# Patient Record
Sex: Male | Born: 1970 | Race: Black or African American | Hispanic: No | Marital: Single | Smoking: Former smoker
Health system: Southern US, Community
[De-identification: ages and names within clinical notes are randomized; demographics above are authoritative.]

## PROBLEM LIST (undated history)

## (undated) DIAGNOSIS — R7303 Prediabetes: Secondary | ICD-10-CM

## (undated) DIAGNOSIS — I10 Essential (primary) hypertension: Secondary | ICD-10-CM

## (undated) DIAGNOSIS — IMO0002 Reserved for concepts with insufficient information to code with codable children: Secondary | ICD-10-CM

## (undated) HISTORY — DX: Essential (primary) hypertension: I10

## (undated) HISTORY — DX: Prediabetes: R73.03

## (undated) HISTORY — DX: Reserved for concepts with insufficient information to code with codable children: IMO0002

---

## 2005-03-02 ENCOUNTER — Emergency Department (HOSPITAL_COMMUNITY): Admission: EM | Admit: 2005-03-02 | Discharge: 2005-03-02 | Payer: Self-pay | Admitting: Emergency Medicine

## 2005-03-18 ENCOUNTER — Emergency Department (HOSPITAL_COMMUNITY): Admission: EM | Admit: 2005-03-18 | Discharge: 2005-03-19 | Payer: Self-pay | Admitting: Emergency Medicine

## 2005-03-28 ENCOUNTER — Emergency Department (HOSPITAL_COMMUNITY): Admission: EM | Admit: 2005-03-28 | Discharge: 2005-03-28 | Payer: Self-pay | Admitting: Family Medicine

## 2005-04-04 ENCOUNTER — Emergency Department (HOSPITAL_COMMUNITY): Admission: EM | Admit: 2005-04-04 | Discharge: 2005-04-05 | Payer: Self-pay | Admitting: Emergency Medicine

## 2005-04-22 ENCOUNTER — Ambulatory Visit (HOSPITAL_COMMUNITY): Admission: RE | Admit: 2005-04-22 | Discharge: 2005-04-22 | Payer: Self-pay | Admitting: Family Medicine

## 2005-04-22 ENCOUNTER — Encounter: Payer: Self-pay | Admitting: Vascular Surgery

## 2005-04-22 ENCOUNTER — Encounter: Payer: Self-pay | Admitting: Emergency Medicine

## 2005-05-03 ENCOUNTER — Encounter: Admission: RE | Admit: 2005-05-03 | Discharge: 2005-05-03 | Payer: Self-pay | Admitting: Gastroenterology

## 2005-05-14 ENCOUNTER — Emergency Department (HOSPITAL_COMMUNITY): Admission: EM | Admit: 2005-05-14 | Discharge: 2005-05-15 | Payer: Self-pay | Admitting: Emergency Medicine

## 2005-09-04 ENCOUNTER — Emergency Department (HOSPITAL_COMMUNITY): Admission: EM | Admit: 2005-09-04 | Discharge: 2005-09-04 | Payer: Self-pay | Admitting: Emergency Medicine

## 2005-10-14 ENCOUNTER — Emergency Department (HOSPITAL_COMMUNITY): Admission: EM | Admit: 2005-10-14 | Discharge: 2005-10-14 | Payer: Self-pay | Admitting: Emergency Medicine

## 2006-03-10 ENCOUNTER — Emergency Department (HOSPITAL_COMMUNITY): Admission: EM | Admit: 2006-03-10 | Discharge: 2006-03-11 | Payer: Self-pay | Admitting: Emergency Medicine

## 2006-10-08 IMAGING — CT CT ANGIO CHEST
1 of 2 series · 19 of 30 positions shown · IV contrast (APPLIED)
Comparison: None.

CLINICAL DATA: 35 year-old-male with chest pain and shortness of breath.
 CT ANGIOGRAPHY OF CHEST:
TECHNIQUE: Multidetector CT imaging of the chest was performed during bolus injection of intravenous contrast.  Multiplanar CT angiographic image reconstructions were generated to evaluate the vascular anatomy.
 Contrast:  80 cc Omnipaque 300.

[Series 7: pe 1.0 b20f st · axial · 0.70mm/px · z∈[+1142,+1396]mm · 19 of 284 slices shown]
[im 15/284  lung]
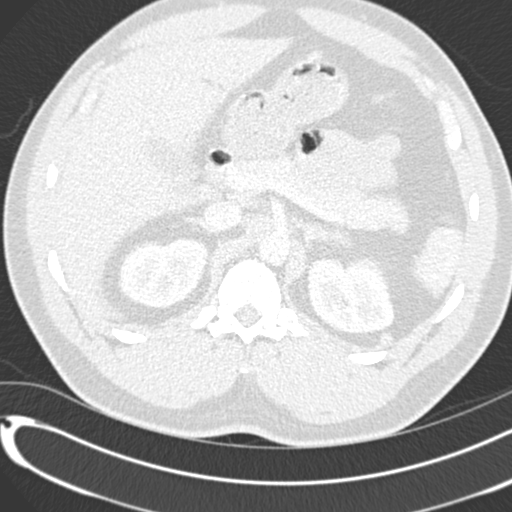
[im 29/284  mediastinal]
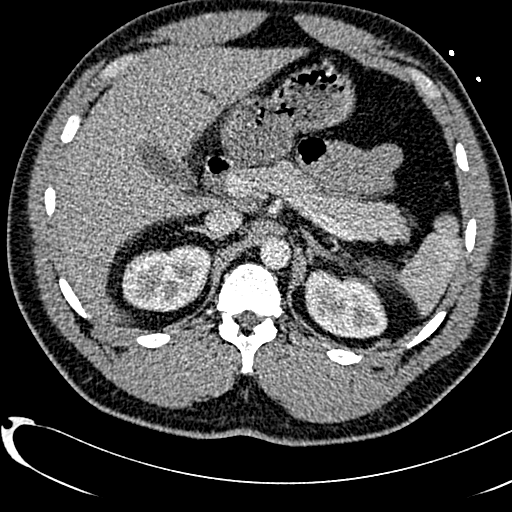
[im 43/284  lung]
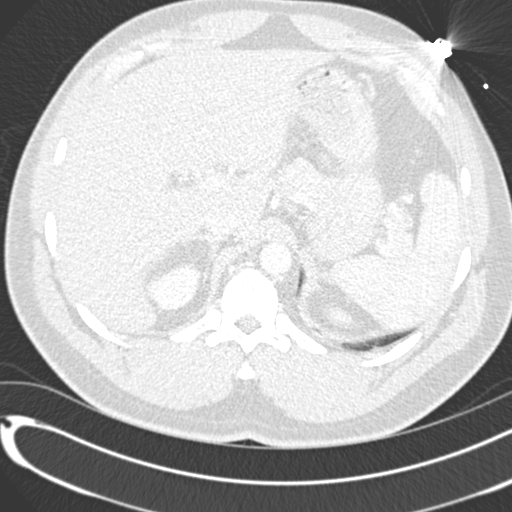
[im 57/284  mediastinal]
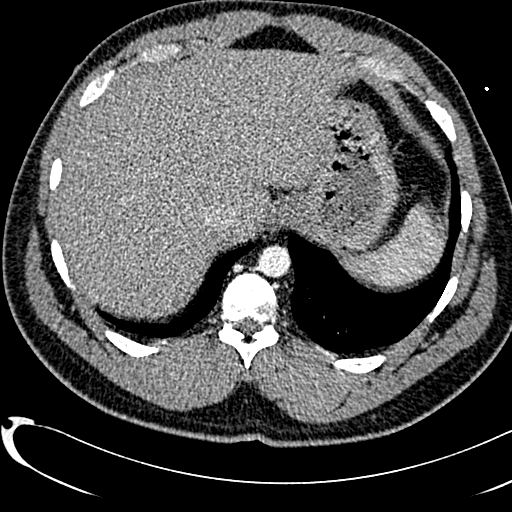
[im 71/284  lung]
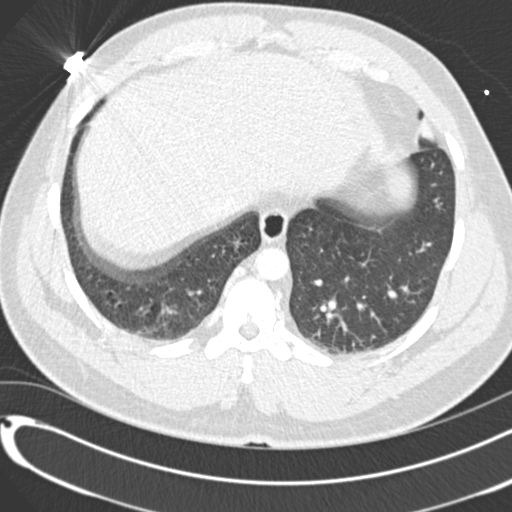
[im 85/284  mediastinal]
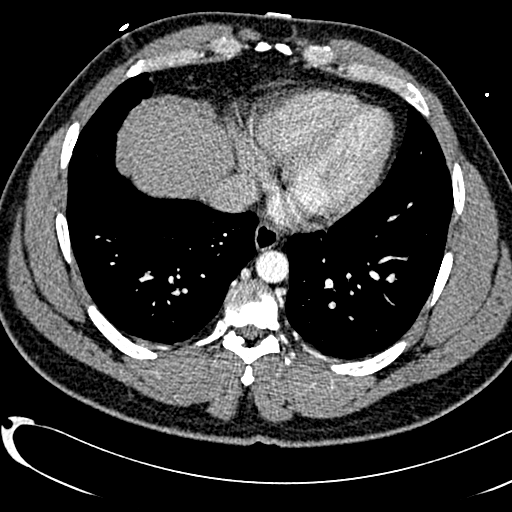
[im 100/284  lung]
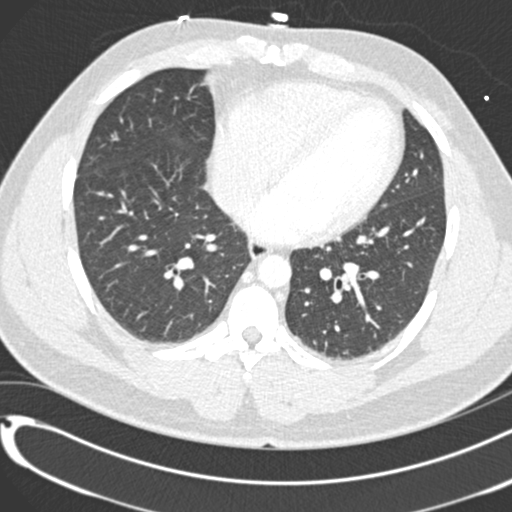
[im 114/284  mediastinal]
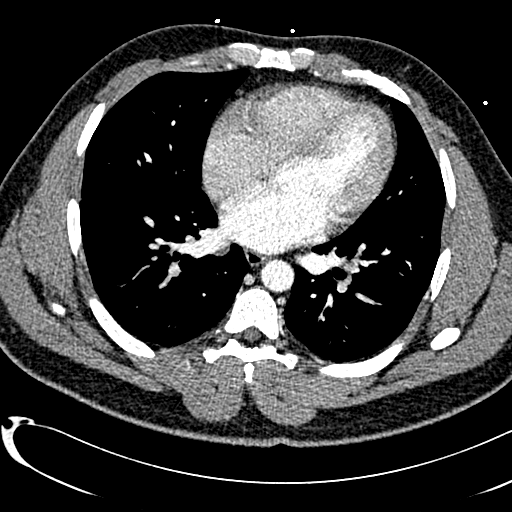
[im 128/284  lung]
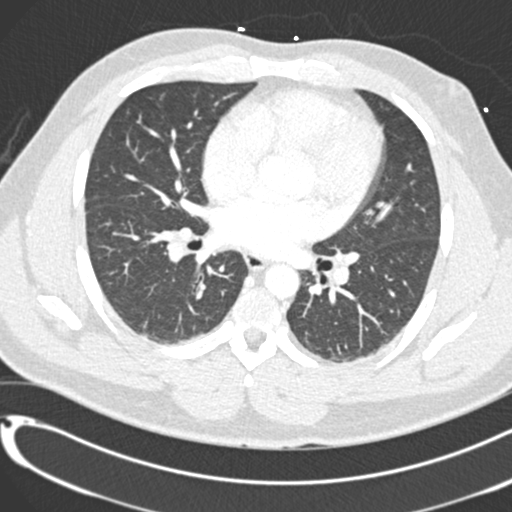
[im 142/284  mediastinal]
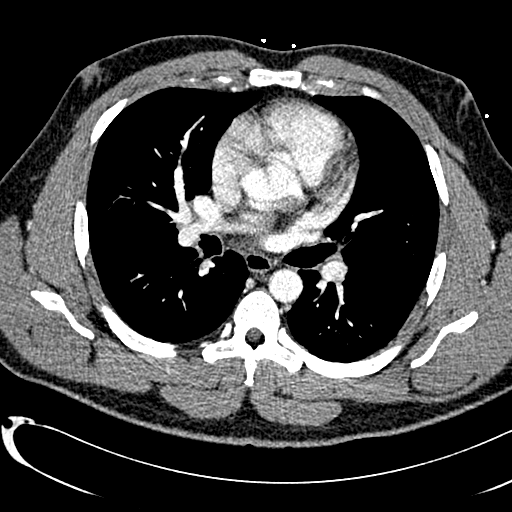
[im 156/284  lung]
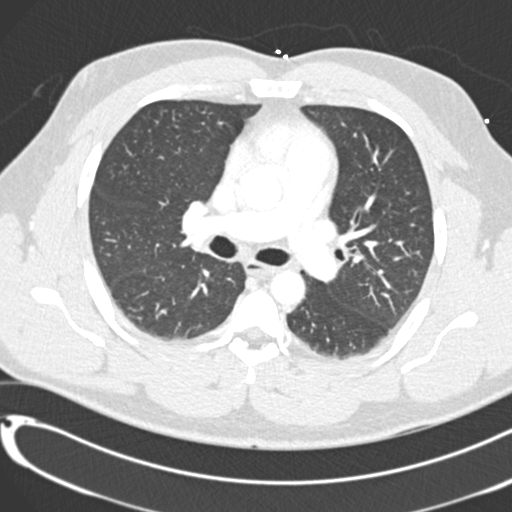
[im 170/284  mediastinal]
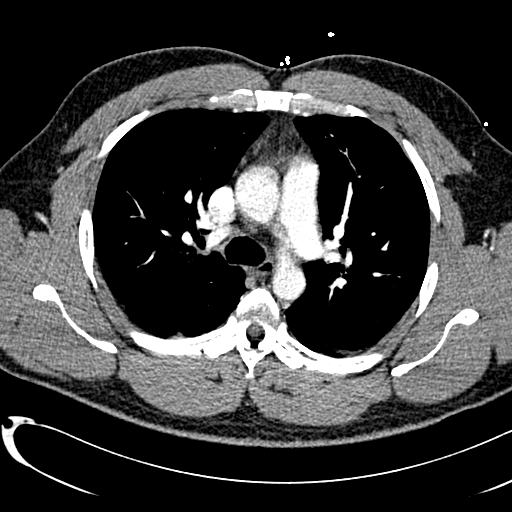
[im 184/284  lung]
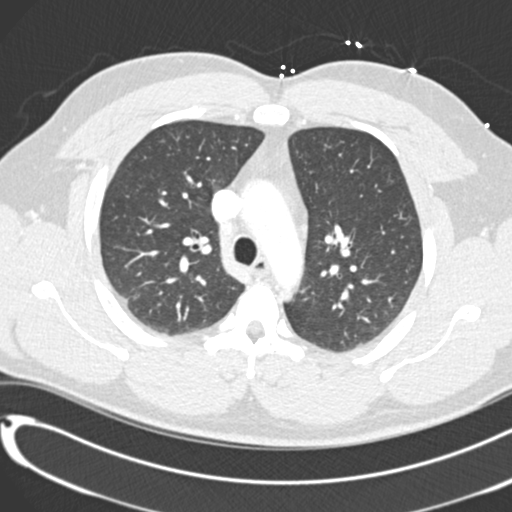
[im 199/284  mediastinal]
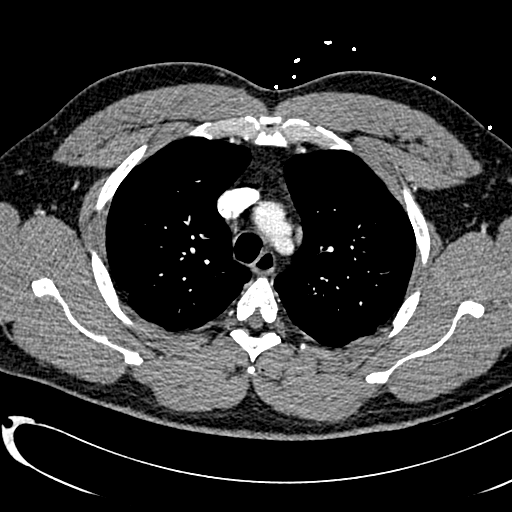
[im 213/284  lung]
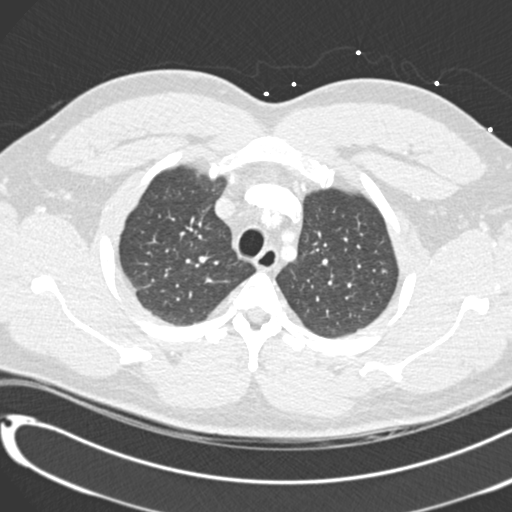
[im 227/284  mediastinal]
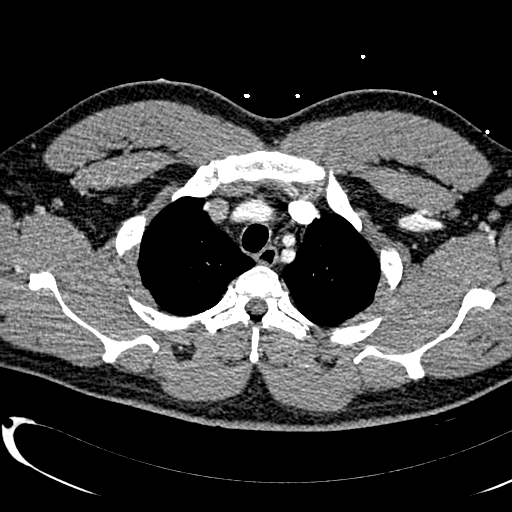
[im 241/284  lung]
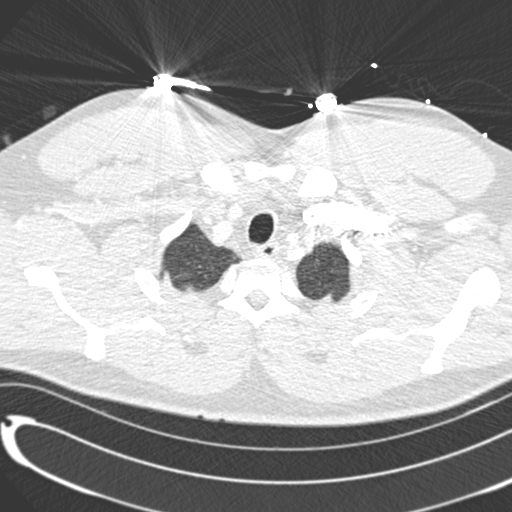
[im 255/284  mediastinal]
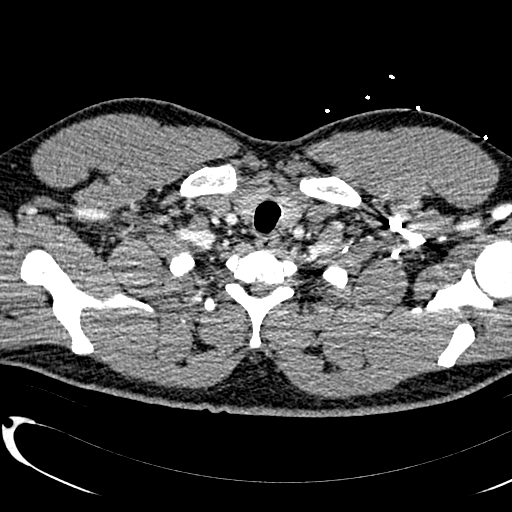
[im 269/284  lung]
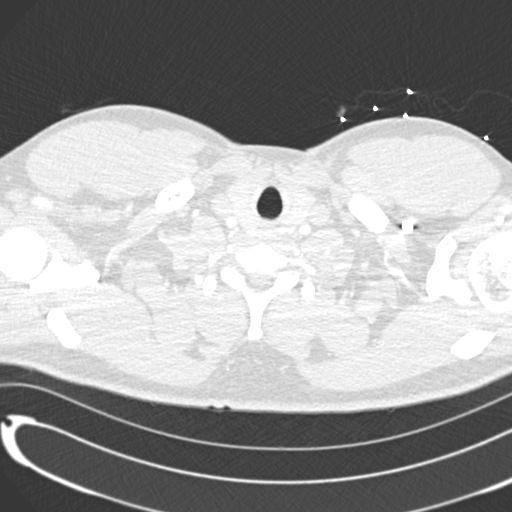

[19 of 30 positions shown; findings below may reference images not displayed]

FINDINGS: The chest wall, soft tissues and bony structures are unremarkable.  Benign appearing fatty right axillary lymph node.  The thyroid gland appears normal.  The heart size is normal.   No pericardial effusion. No mediastinal or hilar adenopathy.  The esophagus appears normal.  The aorta is normal in caliber. No dissection.  The pulmonary arterial tree is suboptimally opacified.  I don?t see any definite filling defects to suggest pulmonary emboli.  There is a rounded fluid attenuation structure which is located adjacent to the confluence of the inferior pulmonary veins on the right side.  This is likely a bronchogenic cyst or possibly a pericardial cyst or esophageal duplication cyst.  
 Examination of the lung parenchyma demonstrates no acute pulmonary findings.  No pulmonary masses or worrisome nodules.  The upper abdomen demonstrates no significant findings.
IMPRESSION: 1.  No CT evidence for pulmonary emboli. 
 2.  No acute pulmonary findings.  
 3.  Normal appearance of the thoracic aorta.

## 2007-04-01 IMAGING — CR DG CHEST 1V PORT
1 series · 1 of 1 positions shown · non-contrast
Comparison: none

CLINICAL DATA: Arm pain and syncope. 
PORTABLE CHEST ? 1 VIEW:
COMPARISION: 04/22/05

[view not recorded]
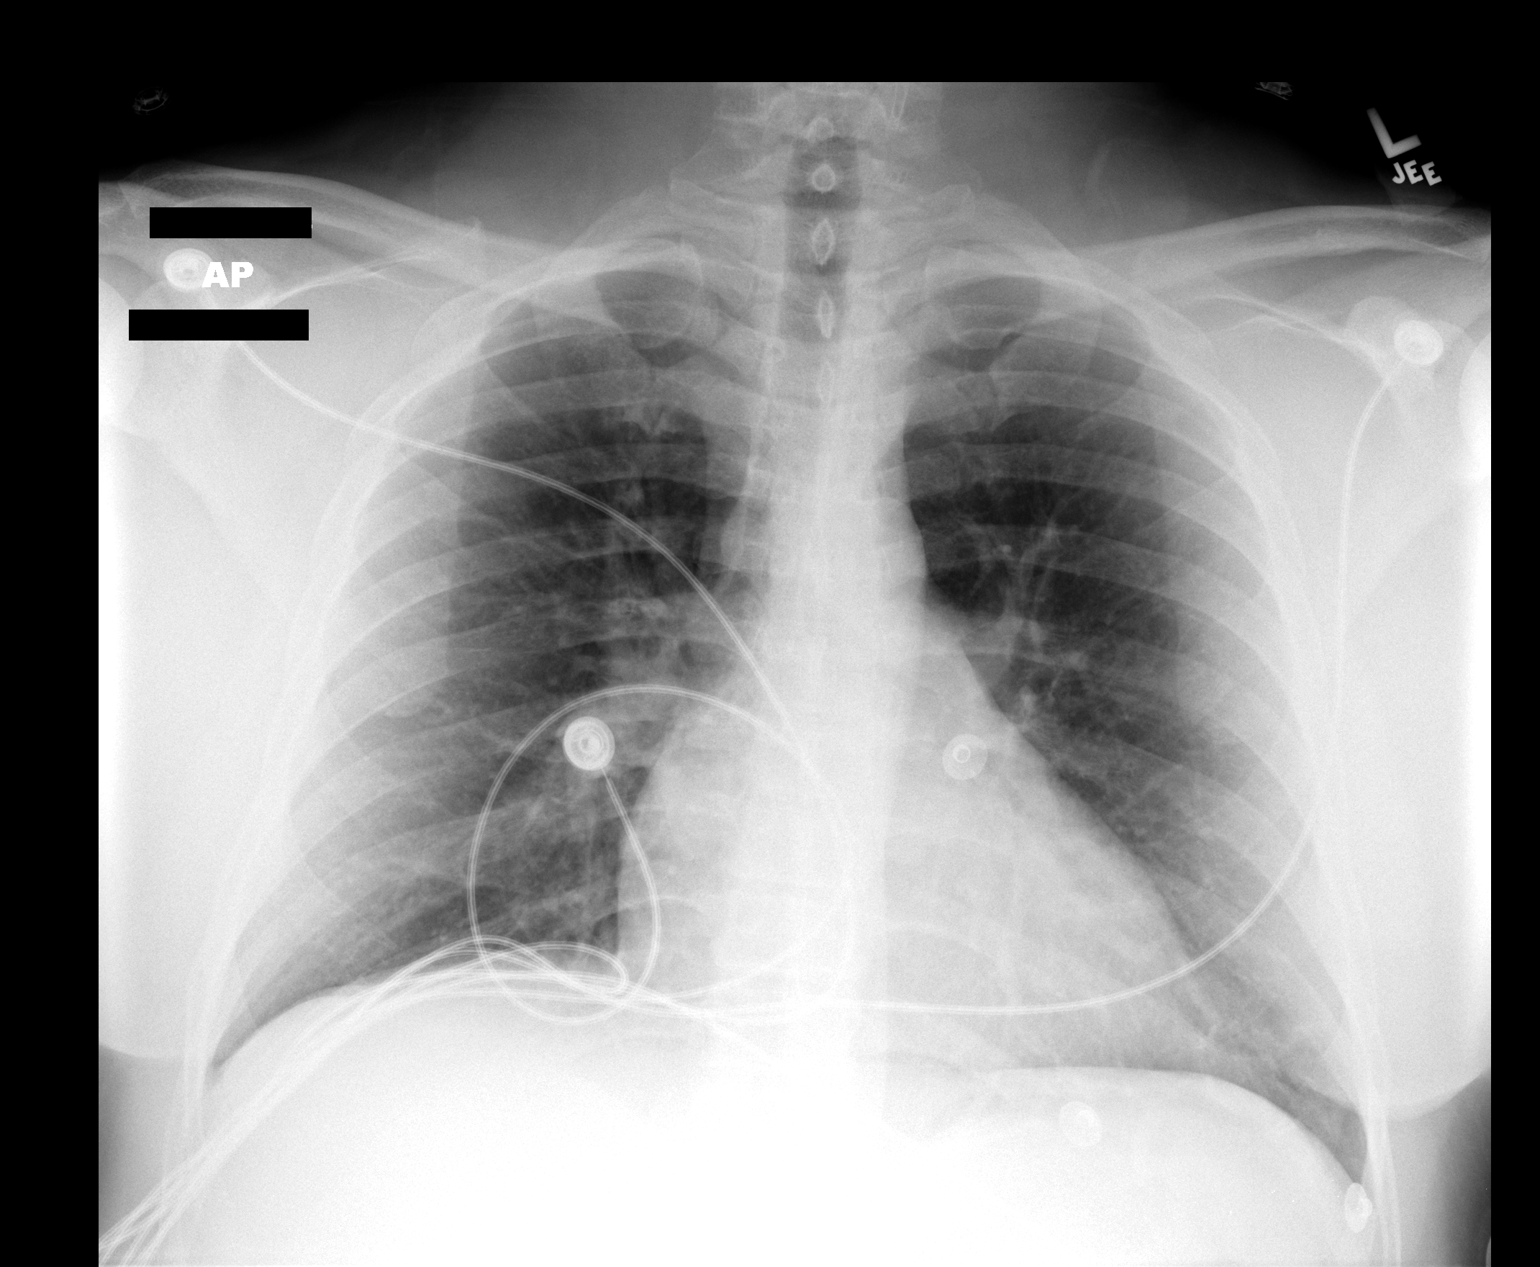

[1 of 1 positions shown; findings below may reference images not displayed]

FINDINGS: The heart size is normal.  There are no effusions or edema.  No airspace opacities are identified.
IMPRESSION: No active cardiopulmonary disease and no change from 04/22/05.

## 2007-06-01 ENCOUNTER — Emergency Department (HOSPITAL_COMMUNITY): Admission: EM | Admit: 2007-06-01 | Discharge: 2007-06-01 | Payer: Self-pay | Admitting: Emergency Medicine

## 2007-06-16 ENCOUNTER — Emergency Department (HOSPITAL_COMMUNITY): Admission: EM | Admit: 2007-06-16 | Discharge: 2007-06-16 | Payer: Self-pay | Admitting: Emergency Medicine

## 2007-06-16 ENCOUNTER — Ambulatory Visit: Payer: Self-pay | Admitting: *Deleted

## 2007-07-25 ENCOUNTER — Emergency Department (HOSPITAL_COMMUNITY): Admission: EM | Admit: 2007-07-25 | Discharge: 2007-07-26 | Payer: Self-pay | Admitting: Emergency Medicine

## 2007-08-19 ENCOUNTER — Ambulatory Visit: Payer: Self-pay | Admitting: Internal Medicine

## 2008-08-26 ENCOUNTER — Emergency Department (HOSPITAL_COMMUNITY): Admission: EM | Admit: 2008-08-26 | Discharge: 2008-08-26 | Payer: Self-pay | Admitting: Emergency Medicine

## 2008-08-27 ENCOUNTER — Ambulatory Visit: Payer: Self-pay | Admitting: *Deleted

## 2008-08-27 ENCOUNTER — Ambulatory Visit (HOSPITAL_COMMUNITY): Admission: RE | Admit: 2008-08-27 | Discharge: 2008-08-27 | Payer: Self-pay | Admitting: Emergency Medicine

## 2008-08-27 ENCOUNTER — Encounter (INDEPENDENT_AMBULATORY_CARE_PROVIDER_SITE_OTHER): Payer: Self-pay | Admitting: Emergency Medicine

## 2009-06-09 ENCOUNTER — Emergency Department (HOSPITAL_COMMUNITY): Admission: EM | Admit: 2009-06-09 | Discharge: 2009-06-09 | Payer: Self-pay | Admitting: Emergency Medicine

## 2010-09-20 ENCOUNTER — Inpatient Hospital Stay (INDEPENDENT_AMBULATORY_CARE_PROVIDER_SITE_OTHER)
Admission: RE | Admit: 2010-09-20 | Discharge: 2010-09-20 | Disposition: A | Discharge status: Home / Self Care | Payer: Self-pay | Source: Ambulatory Visit | Attending: Family Medicine | Admitting: Family Medicine

## 2010-09-20 DIAGNOSIS — N342 Other urethritis: Secondary | ICD-10-CM

## 2010-09-20 DIAGNOSIS — I872 Venous insufficiency (chronic) (peripheral): Secondary | ICD-10-CM

## 2010-09-20 LAB — POCT I-STAT, CHEM 8
Calcium, Ion: 1.22 mmol/L (ref 1.12–1.32)
Chloride: 100 mEq/L (ref 96–112)
Creatinine, Ser: 1 mg/dL (ref 0.50–1.35)
Potassium: 3.7 mEq/L (ref 3.5–5.1)
Sodium: 138 mEq/L (ref 135–145)

## 2010-09-20 LAB — POCT URINALYSIS DIP (DEVICE)
Glucose, UA: NEGATIVE mg/dL
Specific Gravity, Urine: 1.02 (ref 1.005–1.030)
pH: 6 (ref 5.0–8.0)

## 2010-09-21 LAB — GC/CHLAMYDIA PROBE AMP, GENITAL: Chlamydia, DNA Probe: NEGATIVE

## 2010-10-24 LAB — GC/CHLAMYDIA PROBE AMP, GENITAL: GC Probe Amp, Genital: NEGATIVE

## 2010-10-25 LAB — POCT URINALYSIS DIP (DEVICE)
Glucose, UA: NEGATIVE
Ketones, ur: NEGATIVE
Operator id: 30335
Specific Gravity, Urine: >=1.03
Urobilinogen, UA: 0.2

## 2010-10-25 LAB — URINALYSIS, ROUTINE W REFLEX MICROSCOPIC: Nitrite: NEGATIVE

## 2010-10-25 LAB — URINE MICROSCOPIC-ADD ON

## 2010-10-25 LAB — CBC
HCT: 45.3
Hemoglobin: 15.3
MCHC: 33.8
RBC: 5.3

## 2010-10-25 LAB — CULTURE, ROUTINE-GENITAL

## 2010-10-25 LAB — URINE CULTURE
Colony Count: NO GROWTH
Culture: NO GROWTH

## 2010-10-25 LAB — BASIC METABOLIC PANEL
CO2: 24
Chloride: 106
GFR calc non Af Amer: >60
Glucose, Bld: 92
Potassium: 3.3 — ABNORMAL LOW

## 2011-04-28 ENCOUNTER — Emergency Department (HOSPITAL_COMMUNITY)
Admission: EM | Admit: 2011-04-28 | Discharge: 2011-04-28 | Disposition: A | Discharge status: Home / Self Care | Payer: Self-pay | Source: Home / Self Care | Attending: Emergency Medicine | Admitting: Emergency Medicine

## 2011-04-28 ENCOUNTER — Encounter (HOSPITAL_COMMUNITY): Payer: Self-pay

## 2011-04-28 DIAGNOSIS — J45909 Unspecified asthma, uncomplicated: Secondary | ICD-10-CM

## 2011-04-28 MED ORDER — ALBUTEROL SULFATE HFA 108 (90 BASE) MCG/ACT IN AERS: 1.0000 | INHALATION_SPRAY | Freq: Four times a day (QID) | RESPIRATORY_TRACT | Status: DC | PRN

## 2011-04-28 MED ORDER — PREDNISONE 20 MG PO TABS: 40.0000 mg | ORAL_TABLET | Freq: Every day | ORAL | Status: AC

## 2011-04-28 MED ORDER — ALBUTEROL SULFATE (5 MG/ML) 0.5% IN NEBU
INHALATION_SOLUTION | RESPIRATORY_TRACT | Status: AC
  Filled 2011-04-28: qty 1

## 2011-04-28 MED ORDER — PREDNISONE 20 MG PO TABS
ORAL_TABLET | ORAL | Status: AC
  Filled 2011-04-28: qty 3

## 2011-04-28 MED ORDER — ALBUTEROL SULFATE (5 MG/ML) 0.5% IN NEBU
2.5000 mg | INHALATION_SOLUTION | Freq: Once | RESPIRATORY_TRACT | Status: AC
  Administered 2011-04-28: 2.5 mg via RESPIRATORY_TRACT

## 2011-04-28 MED ORDER — IPRATROPIUM BROMIDE 0.02 % IN SOLN
0.5000 mg | Freq: Once | RESPIRATORY_TRACT | Status: AC
  Administered 2011-04-28: 0.5 mg via RESPIRATORY_TRACT

## 2011-04-28 MED ORDER — AZITHROMYCIN 250 MG PO TABS: 250.0000 mg | ORAL_TABLET | Freq: Every day | ORAL | Status: AC

## 2011-04-28 MED ORDER — ALBUTEROL SULFATE (5 MG/ML) 0.5% IN NEBU
5.0000 mg | INHALATION_SOLUTION | Freq: Once | RESPIRATORY_TRACT | Status: AC
  Administered 2011-04-28: 5 mg via RESPIRATORY_TRACT

## 2011-04-28 MED ORDER — ALBUTEROL SULFATE (5 MG/ML) 0.5% IN NEBU
INHALATION_SOLUTION | RESPIRATORY_TRACT | Status: AC
  Filled 2011-04-28: qty 0.5

## 2011-04-28 NOTE — ED Provider Notes (Signed)
History     CSN: [id number]  Arrival date & time 04/28/11  1545   First MD Initiated Contact with Patient 04/28/11 1549      Chief Complaint  Patient presents with  . Shortness of Breath    (Consider location/radiation/quality/duration/timing/severity/associated sxs/prior treatment) HPI Comments: Patient presents to urgent care complaining of shortness of breath and wheezing since yesterday have been using albuterol at home without any improvement. Have been coughing as well for about one to 2 weeks with clear phlegm. Noticed it he does much worse at night and during the day. No fevers  Patient is a 41 y.o. male presenting with shortness of breath. The history is provided by the patient.  Shortness of Breath  The current episode started yesterday. The problem occurs frequently. The problem has been gradually worsening. The problem is moderate. Associated symptoms include rhinorrhea, sore throat, cough, shortness of breath and wheezing. Pertinent negatives include no chest pressure and no fever.    Past Medical History  Diagnosis Date  . Asthma     History reviewed. No pertinent past surgical history.  History reviewed. No pertinent family history.  History  Substance Use Topics  . Smoking status: Current Everyday Smoker -- 1.0 packs/day  . Smokeless tobacco: Not on file  . Alcohol Use: No      Review of Systems  Constitutional: Negative for fever, chills, activity change and fatigue.  HENT: Positive for sore throat and rhinorrhea.   Respiratory: Positive for cough, shortness of breath and wheezing.     Allergies  Review of patient's allergies indicates no known allergies.  Home Medications   Current Outpatient Rx  Name Route Sig Dispense Refill  . ALBUTEROL SULFATE HFA 108 (90 BASE) MCG/ACT IN AERS Inhalation Inhale 2 puffs into the lungs every 6 (six) hours as needed.    . ALBUTEROL SULFATE HFA 108 (90 BASE) MCG/ACT IN AERS Inhalation Inhale 1-2 puffs into the  lungs every 6 (six) hours as needed for wheezing. 1 Inhaler 0  . AZITHROMYCIN 250 MG PO TABS Oral Take 1 tablet (250 mg total) by mouth daily. Take first 2 tablets together, then 1 every day until finished. 6 tablet 0  . PREDNISONE 20 MG PO TABS Oral Take 2 tablets (40 mg total) by mouth daily. 2 tablets daily for 5 days 10 tablet 0    BP 189/93  Pulse 105  Temp(Src) 99.1 F (37.3 C) (Oral)  Resp 20  SpO2 96%  Physical Exam  Nursing note and vitals reviewed. Constitutional: He appears well-developed and well-nourished. No distress.  HENT:  Head: Normocephalic.  Mouth/Throat: No oropharyngeal exudate.  Eyes: Conjunctivae are normal.  Neck: No JVD present.  Pulmonary/Chest: Effort normal. No accessory muscle usage. No respiratory distress. He has decreased breath sounds. He has wheezes.  Abdominal: Soft.  Lymphadenopathy:    He has no cervical adenopathy.  Neurological: He is alert.  Skin: Skin is warm.    ED Course  Procedures (including critical care time)  Labs Reviewed - No data to display No results found.   1. Asthmatic bronchitis       MDM  Asthma exacerbation patient responded well to nebulizer treatments at urgent care patient admits to be a heavy smoker difficult to discontinue smoking. Encouraged patient to return if no improvement within the next 12 hours rest and good hydration along with schedule albuterol doses every 4-6 hours.     Jimmie Molly, MD 04/28/11 2001

## 2011-04-28 NOTE — ED Notes (Signed)
Pt has sob since yesterday, has used albuterol without improvement.

## 2011-04-28 NOTE — Discharge Instructions (Signed)
Asthma, Adult  Asthma is a disease of the lungs and can make it hard to breathe. Asthma cannot be cured, but medicine can help control it. Asthma may be started (triggered) by:   Pollen.   Dust.   Animal skin flakes (dander).   Molds.   Foods.   Respiratory infections (colds, flu).   Smoke.   Exercise.   Stress.   Other things that cause allergic reactions or allergies (allergens).  HOME CARE    Talk to your doctor about how to manage your attacks at home. This may include:   Using a tool called a peak flow meter.   Having medicine ready to stop the attack.   Take all medicine as told by your doctor.   Wash bed sheets and blankets every week in hot water and put them in the dryer.   Drink enough fluids to keep your pee (urine) clear or pale yellow.   Always be ready to get emergency help. Write down the phone number for your doctor. Keep it where you can easily find it.   Talk about exercise routines with your doctor.   If animal dander is causing your asthma, you may need to find a new home for your pet(s).  GET HELP RIGHT AWAY IF:    You have muscle aches.   You cough more.   You have chest pain.   You have thick spit (sputum) that changes to yellow, green, gray, or bloody.   Medicine does not stop your wheezing.   You have problems breathing.   You have a fever.   Your medicine causes:   A rash.   Itching.   Puffiness (swelling).   Breathing problems.  MAKE SURE YOU:    Understand these instructions.   Will watch your condition.   Will get help right away if you are not doing well or get worse.  Document Released: 07/03/2007 Document Revised: 01/03/2011 Document Reviewed: 11/25/2007  [employer] Patient Information 2012 [employer].

## 2012-10-24 ENCOUNTER — Emergency Department (INDEPENDENT_AMBULATORY_CARE_PROVIDER_SITE_OTHER)
Admission: EM | Admit: 2012-10-24 | Discharge: 2012-10-24 | Disposition: A | Discharge status: Other Acute Inpatient Hospital | Payer: Self-pay | Source: Home / Self Care | Attending: Emergency Medicine | Admitting: Emergency Medicine

## 2012-10-24 ENCOUNTER — Emergency Department (HOSPITAL_COMMUNITY): Payer: No Typology Code available for payment source

## 2012-10-24 ENCOUNTER — Emergency Department (INDEPENDENT_AMBULATORY_CARE_PROVIDER_SITE_OTHER): Payer: Self-pay

## 2012-10-24 ENCOUNTER — Inpatient Hospital Stay (HOSPITAL_COMMUNITY)
Admission: EM | Admit: 2012-10-24 | Discharge: 2012-10-27 | DRG: 191 | Disposition: A | Discharge status: Home / Self Care | Payer: No Typology Code available for payment source | Attending: Internal Medicine | Admitting: Internal Medicine

## 2012-10-24 ENCOUNTER — Encounter (HOSPITAL_COMMUNITY): Payer: Self-pay

## 2012-10-24 ENCOUNTER — Encounter (HOSPITAL_COMMUNITY): Payer: Self-pay | Admitting: *Deleted

## 2012-10-24 DIAGNOSIS — Z6841 Body Mass Index (BMI) 40.0 and over, adult: Secondary | ICD-10-CM

## 2012-10-24 DIAGNOSIS — T380X5A Adverse effect of glucocorticoids and synthetic analogues, initial encounter: Secondary | ICD-10-CM | POA: Diagnosis not present

## 2012-10-24 DIAGNOSIS — J069 Acute upper respiratory infection, unspecified: Secondary | ICD-10-CM

## 2012-10-24 DIAGNOSIS — R062 Wheezing: Secondary | ICD-10-CM

## 2012-10-24 DIAGNOSIS — Z23 Encounter for immunization: Secondary | ICD-10-CM

## 2012-10-24 DIAGNOSIS — J4489 Other specified chronic obstructive pulmonary disease: Secondary | ICD-10-CM

## 2012-10-24 DIAGNOSIS — R0902 Hypoxemia: Secondary | ICD-10-CM | POA: Diagnosis present

## 2012-10-24 DIAGNOSIS — M7989 Other specified soft tissue disorders: Secondary | ICD-10-CM

## 2012-10-24 DIAGNOSIS — J441 Chronic obstructive pulmonary disease with (acute) exacerbation: Principal | ICD-10-CM | POA: Diagnosis present

## 2012-10-24 DIAGNOSIS — D72829 Elevated white blood cell count, unspecified: Secondary | ICD-10-CM | POA: Diagnosis present

## 2012-10-24 DIAGNOSIS — J449 Chronic obstructive pulmonary disease, unspecified: Secondary | ICD-10-CM | POA: Diagnosis present

## 2012-10-24 DIAGNOSIS — R0602 Shortness of breath: Secondary | ICD-10-CM

## 2012-10-24 DIAGNOSIS — Z7982 Long term (current) use of aspirin: Secondary | ICD-10-CM

## 2012-10-24 DIAGNOSIS — F17200 Nicotine dependence, unspecified, uncomplicated: Secondary | ICD-10-CM | POA: Diagnosis present

## 2012-10-24 DIAGNOSIS — R609 Edema, unspecified: Secondary | ICD-10-CM | POA: Diagnosis present

## 2012-10-24 DIAGNOSIS — R600 Localized edema: Secondary | ICD-10-CM | POA: Diagnosis present

## 2012-10-24 LAB — CBC WITH DIFFERENTIAL/PLATELET
Basophils Absolute: 0 10*3/uL (ref 0.0–0.1)
Basophils Relative: 0 % (ref 0–1)
Eosinophils Absolute: 0.5 10*3/uL (ref 0.0–0.7)
HCT: 44.2 % (ref 39.0–52.0)
Hemoglobin: 14.9 g/dL (ref 13.0–17.0)
Lymphocytes Relative: 20 % (ref 12–46)
MCHC: 33.7 g/dL (ref 30.0–36.0)
Monocytes Relative: 10 % (ref 3–12)
Neutro Abs: 7.5 10*3/uL (ref 1.7–7.7)
Neutrophils Relative %: 66 % (ref 43–77)
RDW: 14.3 % (ref 11.5–15.5)
WBC: 11.5 10*3/uL — ABNORMAL HIGH (ref 4.0–10.5)

## 2012-10-24 LAB — BASIC METABOLIC PANEL
CO2: 26 mEq/L (ref 19–32)
Chloride: 99 mEq/L (ref 96–112)
Creatinine, Ser: 0.97 mg/dL (ref 0.50–1.35)
GFR calc Af Amer: >90 mL/min (ref >90)
Potassium: 3.8 mEq/L (ref 3.5–5.1)

## 2012-10-24 LAB — D-DIMER, QUANTITATIVE: D-Dimer, Quant: <0.27 ug/mL-FEU (ref 0.00–0.48)

## 2012-10-24 MED ORDER — ALBUTEROL SULFATE (5 MG/ML) 0.5% IN NEBU
2.5000 mg | INHALATION_SOLUTION | RESPIRATORY_TRACT | Status: DC
  Administered 2012-10-24 – 2012-10-27 (×15): 2.5 mg via RESPIRATORY_TRACT
  Filled 2012-10-24 (×16): qty 0.5

## 2012-10-24 MED ORDER — PANTOPRAZOLE SODIUM 40 MG PO TBEC
40.0000 mg | DELAYED_RELEASE_TABLET | Freq: Every day | ORAL | Status: DC
  Administered 2012-10-25 – 2012-10-27 (×4): 40 mg via ORAL
  Filled 2012-10-24 (×3): qty 1

## 2012-10-24 MED ORDER — SODIUM CHLORIDE 0.9 % IV BOLUS (SEPSIS)
500.0000 mL | Freq: Once | INTRAVENOUS | Status: AC
  Administered 2012-10-24: 500 mL via INTRAVENOUS

## 2012-10-24 MED ORDER — ALBUTEROL SULFATE (5 MG/ML) 0.5% IN NEBU
INHALATION_SOLUTION | RESPIRATORY_TRACT | Status: AC
  Filled 2012-10-24: qty 1

## 2012-10-24 MED ORDER — ONDANSETRON HCL 4 MG PO TABS: 4.0000 mg | ORAL_TABLET | Freq: Four times a day (QID) | ORAL | Status: DC | PRN

## 2012-10-24 MED ORDER — IOHEXOL 350 MG/ML SOLN
80.0000 mL | Freq: Once | INTRAVENOUS | Status: AC | PRN
  Administered 2012-10-24: 80 mL via INTRAVENOUS

## 2012-10-24 MED ORDER — METHYLPREDNISOLONE SODIUM SUCC 125 MG IJ SOLR
80.0000 mg | Freq: Two times a day (BID) | INTRAMUSCULAR | Status: AC
  Administered 2012-10-25: 05:00:00 via INTRAVENOUS
  Administered 2012-10-25: 80 mg via INTRAVENOUS
  Filled 2012-10-24 (×2): qty 1.28

## 2012-10-24 MED ORDER — IOHEXOL 350 MG/ML SOLN
100.0000 mL | Freq: Once | INTRAVENOUS | Status: AC | PRN
  Administered 2012-10-24: 100 mL via INTRAVENOUS

## 2012-10-24 MED ORDER — ALBUTEROL SULFATE (5 MG/ML) 0.5% IN NEBU
2.5000 mg | INHALATION_SOLUTION | Freq: Once | RESPIRATORY_TRACT | Status: AC
  Administered 2012-10-24: 2.5 mg via RESPIRATORY_TRACT
  Filled 2012-10-24: qty 0.5

## 2012-10-24 MED ORDER — IPRATROPIUM BROMIDE 0.02 % IN SOLN
0.5000 mg | RESPIRATORY_TRACT | Status: DC
  Administered 2012-10-24 – 2012-10-27 (×14): 0.5 mg via RESPIRATORY_TRACT
  Filled 2012-10-24 (×14): qty 2.5

## 2012-10-24 MED ORDER — ZOLPIDEM TARTRATE 5 MG PO TABS: 5.0000 mg | ORAL_TABLET | Freq: Every evening | ORAL | Status: DC | PRN

## 2012-10-24 MED ORDER — OXYCODONE HCL 5 MG PO TABS: 5.0000 mg | ORAL_TABLET | ORAL | Status: DC | PRN

## 2012-10-24 MED ORDER — METHYLPREDNISOLONE SODIUM SUCC 125 MG IJ SOLR
125.0000 mg | Freq: Once | INTRAMUSCULAR | Status: AC
  Administered 2012-10-24: 125 mg via INTRAMUSCULAR

## 2012-10-24 MED ORDER — ALUM & MAG HYDROXIDE-SIMETH 200-200-20 MG/5ML PO SUSP: 30.0000 mL | Freq: Four times a day (QID) | ORAL | Status: DC | PRN

## 2012-10-24 MED ORDER — ACETAMINOPHEN 325 MG PO TABS: 650.0000 mg | ORAL_TABLET | Freq: Four times a day (QID) | ORAL | Status: DC | PRN

## 2012-10-24 MED ORDER — ENOXAPARIN SODIUM 80 MG/0.8ML ~~LOC~~ SOLN
80.0000 mg | SUBCUTANEOUS | Status: DC
  Administered 2012-10-25: 12:00:00 80 mg via SUBCUTANEOUS
  Filled 2012-10-24 (×2): qty 0.8

## 2012-10-24 MED ORDER — ENOXAPARIN SODIUM 40 MG/0.4ML ~~LOC~~ SOLN: 40.0000 mg | SUBCUTANEOUS | Status: DC

## 2012-10-24 MED ORDER — ENOXAPARIN SODIUM 150 MG/ML ~~LOC~~ SOLN: 1.0000 mg/kg | Freq: Two times a day (BID) | SUBCUTANEOUS | Status: DC

## 2012-10-24 MED ORDER — ALBUTEROL SULFATE (5 MG/ML) 0.5% IN NEBU
5.0000 mg | INHALATION_SOLUTION | Freq: Once | RESPIRATORY_TRACT | Status: AC
  Administered 2012-10-24: 5 mg via RESPIRATORY_TRACT

## 2012-10-24 MED ORDER — IPRATROPIUM-ALBUTEROL 0.5-2.5 (3) MG/3ML IN SOLN
3.0000 mL | Freq: Once | RESPIRATORY_TRACT | Status: AC
  Administered 2012-10-24: 3 mL via RESPIRATORY_TRACT

## 2012-10-24 MED ORDER — HYDROMORPHONE HCL PF 1 MG/ML IJ SOLN: 0.5000 mg | INTRAMUSCULAR | Status: DC | PRN

## 2012-10-24 MED ORDER — ONDANSETRON HCL 4 MG/2ML IJ SOLN: 4.0000 mg | Freq: Four times a day (QID) | INTRAMUSCULAR | Status: DC | PRN

## 2012-10-24 MED ORDER — SODIUM CHLORIDE 0.9 % IV SOLN
INTRAVENOUS | Status: DC
  Administered 2012-10-25: 04:00:00 100 mL via INTRAVENOUS

## 2012-10-24 MED ORDER — LEVOFLOXACIN IN D5W 500 MG/100ML IV SOLN
500.0000 mg | INTRAVENOUS | Status: DC
  Administered 2012-10-25 – 2012-10-26 (×2): 500 mg via INTRAVENOUS
  Filled 2012-10-24 (×3): qty 100

## 2012-10-24 MED ORDER — ACETAMINOPHEN 650 MG RE SUPP: 650.0000 mg | Freq: Four times a day (QID) | RECTAL | Status: DC | PRN

## 2012-10-24 MED ORDER — LEVOFLOXACIN IN D5W 750 MG/150ML IV SOLN
750.0000 mg | Freq: Once | INTRAVENOUS | Status: AC
  Administered 2012-10-25: 750 mg via INTRAVENOUS
  Filled 2012-10-24: qty 150

## 2012-10-24 MED ORDER — METHYLPREDNISOLONE SODIUM SUCC 125 MG IJ SOLR
INTRAMUSCULAR | Status: AC
  Filled 2012-10-24: qty 2

## 2012-10-24 NOTE — ED Notes (Signed)
Patient transported to CT 

## 2012-10-24 NOTE — ED Notes (Signed)
MD at bedside. 

## 2012-10-24 NOTE — ED Provider Notes (Signed)
CT scan was reviewed and is negative for PE made arrangements to admit the patient to the hospital for COPD flare or cast for an additional albuterol treatment, as he is satting 90% on 3 L of oxygen.  I discussed this plan with the patient is agreeable to admission  Arman Filter, NP 10/24/12 2206

## 2012-10-24 NOTE — ED Notes (Signed)
Reportedly has been using other peoples MID for some time now. Audible wheezing

## 2012-10-24 NOTE — ED Provider Notes (Signed)
CSN: [id number]     Arrival date & time 10/24/12  1739 History   First MD Initiated Contact with Patient 10/24/12 1859     Chief Complaint  Patient presents with  . Shortness of Breath  . Leg Swelling   (Consider location/radiation/quality/duration/timing/severity/associated sxs/prior Treatment) Patient is a 42 y.o. male presenting with shortness of breath.  Shortness of Breath Associated symptoms: chest pain   Associated symptoms: no abdominal pain, no headaches, no rash and no vomiting    patient's had cough and shortness of breath over the last week. Has a history of asthma. She's taken a friend's steroids for 4 days without relief. He's also been using inhalers. He states normally it would respond to inhaler. He was seen in urgent care and transferred down here for CT angiography. He has swelling in both lower extremities, however it is greater on the right side. He states that leg is always swollen. He is a cough is minimally productive. He's had some dull chest pain. He has asthma and continues to smoke.  Past Medical History  Diagnosis Date  . Asthma    History reviewed. No pertinent past surgical history. History reviewed. No pertinent family history. History  Substance Use Topics  . Smoking status: Current Every Day Smoker -- 1.00 packs/day  . Smokeless tobacco: Not on file  . Alcohol Use: No    Review of Systems  Constitutional: Positive for fatigue. Negative for activity change and appetite change.  HENT: Negative for neck stiffness.   Eyes: Negative for pain.  Respiratory: Positive for shortness of breath. Negative for chest tightness.   Cardiovascular: Positive for chest pain and leg swelling.  Gastrointestinal: Negative for nausea, vomiting, abdominal pain and diarrhea.  Genitourinary: Negative for flank pain.  Musculoskeletal: Negative for back pain.  Skin: Negative for rash.  Neurological: Negative for weakness, numbness and headaches.  Psychiatric/Behavioral:  Negative for behavioral problems.    Allergies  Review of patient's allergies indicates no known allergies.  Home Medications   Current Outpatient Rx  Name  Route  Sig  Dispense  Refill  . albuterol (PROVENTIL HFA;VENTOLIN HFA) 108 (90 BASE) MCG/ACT inhaler   Inhalation   Inhale 2 puffs into the lungs every 6 (six) hours as needed for wheezing or shortness of breath.          Marland Kitchen aspirin EC 325 MG tablet   Oral   Take 325 mg by mouth daily as needed for pain.         . hydrocortisone cream 1 %   Topical   Apply 1 application topically daily as needed (rash).         . neomycin-bacitracin-polymyxin (NEOSPORIN) OINT   Topical   Apply 1 application topically 2 (two) times daily as needed (wound care).          BP 140/75  Pulse 112  Temp(Src) 98.1 F (36.7 C) (Oral)  Resp 22  SpO2 92% Physical Exam  Nursing note and vitals reviewed. Constitutional: He is oriented to person, place, and time. He appears well-developed and well-nourished.  HENT:  Head: Normocephalic and atraumatic.  Eyes: EOM are normal. Pupils are equal, round, and reactive to light.  Neck: Normal range of motion. Neck supple.  Cardiovascular: Regular rhythm and normal heart sounds.   No murmur heard. Tachycardia  Pulmonary/Chest: Effort normal. He has wheezes.  Diffuse wheezes and prolonged expirations. Mild tachypnea no distress  Abdominal: Soft. Bowel sounds are normal. He exhibits no distension and no mass. There is  no tenderness. There is no rebound and no guarding.  Musculoskeletal: Normal range of motion. He exhibits edema.  Edema bilateral lower extremities. Worse on right. Some changes of chronic vascular insufficiency.  Neurological: He is alert and oriented to person, place, and time. No cranial nerve deficit.  Skin: Skin is warm and dry.  Psychiatric: He has a normal mood and affect.    ED Course  Procedures (including critical care time) Labs Review Labs Reviewed - No data to  display Imaging Review Dg Chest 2 View  10/24/2012   CLINICAL DATA:  Cough, shortness of breath and wheezing for 2 days, long smoking history, history asthma  EXAM: CHEST  2 VIEW  COMPARISON:  10/14/2005  FINDINGS: Minimal enlargement of cardiac silhouette.  Mediastinal contours and pulmonary vascularity normal.  Bronchitic changes with slight flattening of diaphragms question chronic bronchitis/COPD.  No definite acute infiltrate, pleural effusion or pneumothorax.  No acute osseous findings.  IMPRESSION: Minimal enlargement of cardiac silhouette.  Bronchitic changes and mild emphysematous changes question related to chronic bronchitis or COPD.  No acute infiltrate.   Electronically Signed   By: Ulyses Southward M.D.   On: 10/24/2012 15:47    MDM  No diagnosis found. Patient with shortness of breath. Likely asthma/COPD but does have swelling of right leg greater than left. Will get CT angiography. Likely require admission to hospital. Does not have PCP    Juliet Rude. Rubin Payor, MD 10/24/12 2023

## 2012-10-24 NOTE — ED Notes (Signed)
Pt having sob getting progressively worse x 2 days. Went to [facility]cc today and received 3 breathing tx and solumedrol IM with minimal relief. Audible wheezing noted on arrival, spo2 94%, pt is able to speak in full sentences. Pt reports having swelling to right leg for extended amount of time, ucc sent here for ct scan of chest to r/o PE.

## 2012-10-24 NOTE — H&P (Signed)
[facility] History and Physical  Samuel [name]:[id number] DOB: 29-Apr-1970 DOA: 10/24/2012  Referring physician:  EDP PCP: No PCP Per Patient  Specialists:   Chief Complaint: SOB  HPI: Samuel [name] is a 42 y.o. male with a history of Asthma/COPD who presents to the ED with complaints of Worsening SOB, and Wheezing over the past 2 days.  He reports having increased cough which is productive of clear sputum.   He denies having any fevers or chills.   He went to the [facility] today to be evaluated and they sent him to the ED for further evaluation due to Edema of his RLE which is chronic and his SOB and concerns fro a possible PE.   He has  A CTA of the Chest performed in the ED which was negative for a PE.   He was treated with nebulizer treatements and IV steroid RX, and supplemental O2, and was referred for medical admission.      Review of Systems: The patient denies anorexia, fever, chills, headaches, weight loss,, vision loss, diplopia, dizziness, decreased hearing, rhinitis, hoarseness, chest pain, syncope, dyspnea on exertion, peripheral edema, balance deficits, cough, hemoptysis, abdominal pain, nausea, vomiting, diarrhea, constipation, hematemesis, melena, hematochezia, severe indigestion/heartburn, dysuria, hematuria, incontinence, muscle weakness, suspicious skin lesions, transient blindness, difficulty walking, depression, unusual weight change, abnormal bleeding, enlarged lymph nodes, angioedema, and breast masses.    Past Medical History  Diagnosis Date  . Asthma     History reviewed. No pertinent past surgical history.  Prior to Admission medications   Medication Sig Start Date End Date Taking? Authorizing Provider  albuterol (PROVENTIL HFA;VENTOLIN HFA) 108 (90 BASE) MCG/ACT inhaler Inhale 2 puffs into the lungs every 6 (six) hours as needed for wheezing or shortness of breath.    Yes Historical Provider, MD  aspirin EC 325 MG tablet Take 325 mg by mouth daily as  needed for pain.   Yes Historical Provider, MD  hydrocortisone cream 1 % Apply 1 application topically daily as needed (rash).   Yes Historical Provider, MD  neomycin-bacitracin-polymyxin (NEOSPORIN) OINT Apply 1 application topically 2 (two) times daily as needed (wound care).   Yes Historical Provider, MD    No Known Allergies  Social History:  reports that he has been smoking.  He does not have any smokeless tobacco history on file. He reports that he does not drink alcohol. His drug history is not on file.     History reviewed. No pertinent family history.  (be sure to complete)   Physical Exam:  GEN:  Pleasant  Morbidly Obese  42 y.o. African American male  examined  and in no acute distress; cooperative with exam Filed Vitals:   10/24/12 2119 10/24/12 2152 10/24/12 2200 10/24/12 2215  BP:   140/47   Pulse:   105   Temp: 98.7 F (37.1 C)     TempSrc: Oral     Resp:   13   SpO2:  90% 92% 96%   Blood pressure 140/47, pulse 105, temperature 98.7 F (37.1 C), temperature source Oral, resp. rate 13, SpO2 96.00%. PSYCH: He is alert and oriented x4; does not appear anxious does not appear depressed; affect is normal HEENT: Normocephalic and Atraumatic, Mucous membranes pink; PERRLA; EOM intact; Fundi:  Benign;  No scleral icterus, Nares: Patent, Oropharynx: Clear, Fair Dentition, Neck:  FROM, no cervical lymphadenopathy nor thyromegaly or carotid bruit; no JVD; Breasts:: Not examined CHEST WALL: No tenderness CHEST: Normal respiration, clear to auscultation bilaterally HEART:  Regular rate and rhythm; no murmurs rubs or gallops BACK: No kyphosis or scoliosis; no CVA tenderness ABDOMEN: Positive Bowel Sounds, Obese, soft non-tender; no masses, no organomegaly, no pannus; no intertriginous candida. Rectal Exam: Not done EXTREMITIES: No cyanosis, clubbing or 2+EDEMA RLE with chronic Venous stasis changes, no ulcerations. Genitalia: not examined PULSES: 2+ and symmetric SKIN: Normal  hydration no rash or ulceration CNS: Cranial nerves 2-12 grossly intact no focal neurologic deficit    Labs on Admission:  Basic Metabolic Panel:  Recent Labs Lab 10/24/12 1508  NA 136  K 3.8  CL 99  CO2 26  GLUCOSE 101*  BUN 8  CREATININE 0.97  CALCIUM 9.0   Liver Function Tests: No results found for this basename: AST, ALT, ALKPHOS, BILITOT, PROT, ALBUMIN,  in the last 168 hours No results found for this basename: LIPASE, AMYLASE,  in the last 168 hours No results found for this basename: AMMONIA,  in the last 168 hours CBC:  Recent Labs Lab 10/24/12 1508  WBC 11.5*  NEUTROABS 7.5  HGB 14.9  HCT 44.2  MCV 83.7  PLT 311   Cardiac Enzymes: No results found for this basename: CKTOTAL, CKMB, CKMBINDEX, TROPONINI,  in the last 168 hours  BNP (last 3 results) No results found for this basename: PROBNP,  in the last 8760 hours CBG: No results found for this basename: GLUCAP,  in the last 168 hours  Radiological Exams on Admission: Dg Chest 2 View  10/24/2012   CLINICAL DATA:  Cough, shortness of breath and wheezing for 2 days, long smoking history, history asthma  EXAM: CHEST  2 VIEW  COMPARISON:  10/14/2005  FINDINGS: Minimal enlargement of cardiac silhouette.  Mediastinal contours and pulmonary vascularity normal.  Bronchitic changes with slight flattening of diaphragms question chronic bronchitis/COPD.  No definite acute infiltrate, pleural effusion or pneumothorax.  No acute osseous findings.  IMPRESSION: Minimal enlargement of cardiac silhouette.  Bronchitic changes and mild emphysematous changes question related to chronic bronchitis or COPD.  No acute infiltrate.   Electronically Signed   By: Ulyses Southward M.D.   On: 10/24/2012 15:47   Ct Angio Chest W/cm &/or Wo Cm  10/24/2012   *RADIOLOGY REPORT*  Clinical Data: 42 year old male with shortness of breath and chest discomfort.  Wheezing and cough.  CT ANGIOGRAPHY CHEST  Technique:  Multidetector CT imaging of the chest  using the standard protocol during bolus administration of intravenous contrast. Multiplanar reconstructed images including MIPs were obtained and reviewed to evaluate the vascular anatomy.  Contrast:  80 ml intravenous Omnipaque 350  Comparison: 04/22/2005 CT  Findings: This study is technically borderline secondary to patient respiratory motion and body habitus.  No pulmonary emboli are identified. Cardiomegaly is present. There is no evidence of thoracic aortic aneurysm or definite dissection.  No pleural or pericardial effusions identified. No enlarged lymph nodes are identified.  Mild to moderate bibasilar atelectasis is noted. There is no evidence of airspace disease, mass, consolidation or endobronchial/endotracheal lesion. There is no evidence of mediastinal hematoma or pneumothorax.  No acute or suspicious bony abnormalities are present. The visualized upper abdomen is unremarkable.  IMPRESSION: No definite pulmonary emboli but slightly decreased sensitivity secondary to respiratory motion artifact and body habitus.  Mild to moderate bibasilar atelectasis.  Cardiomegaly.   Original Report Authenticated By: Harmon Pier, M.D.     EKG: Independently reviewed.    Assessment/Plan Principal Problem:   COPD exacerbation Active Problems:   Hypoxemia   Acute upper respiratory infections of  unspecified site   Tobacco use disorder   Morbid obesity   Edema leg   1.  COPD Exacerbation-   IV Steroid Taper, DuoNebs, O2, and Empiric Levaquin.     2.  Hypoxemia-  Same as #1.    3.  Acute URI-  Acute Bronchitis?, CXR Negative.    4.  Edema of Right Leg- chronic, Venous Duplex Doppelr of RLE to evaluate for a DVT.    5.  Morbid Obesity-     6.  Tobacco Use Disorder-  Smoking Cessation Discussed  7.  DVT prophylaxis with Lovenox.      Code Status:       Family Communication:     Disposition Plan:         Time spent:   Ron Parker [facility] Pager [phone]  If 7PM-7AM,  please contact night-coverage [email or web address] Password [facility] 10/24/2012, 10:43 PM

## 2012-10-24 NOTE — ED Provider Notes (Signed)
CSN: [id number]     Arrival date & time 10/24/12  1332 History   First MD Initiated Contact with Patient 10/24/12 1443     Chief Complaint  Patient presents with  . Asthma   (Consider location/radiation/quality/duration/timing/severity/associated sxs/prior Treatment) HPI Comments: 42 year old male presents in acute respiratory distress, complaining of worsening of his asthma for the past 2 days. He states that for the last month he has been having a lot of issues with his asthma and it is not responding well to his albuterol inhalers. Past 2 days, he has been using his inhaler approximately every 15-30 minutes but this has not been helping. He has wheezing, shortness of breath, and severe cough at night. He also has a history of right leg swelling for the past year and a nonhealing ulcer on his lower right leg. He has never had this evaluated  Patient is a 42 y.o. male presenting with asthma.  Asthma Associated symptoms include shortness of breath. Pertinent negatives include no chest pain and no abdominal pain.    Past Medical History  Diagnosis Date  . Asthma    History reviewed. No pertinent past surgical history. No family history on file. History  Substance Use Topics  . Smoking status: Current Every Day Smoker -- 1.00 packs/day  . Smokeless tobacco: Not on file  . Alcohol Use: No    Review of Systems  Constitutional: Negative for fever, chills and fatigue.  HENT: Negative for sore throat, neck pain and neck stiffness.   Eyes: Negative for visual disturbance.  Respiratory: Positive for cough, chest tightness, shortness of breath and wheezing.   Cardiovascular: Negative for chest pain, palpitations and leg swelling.  Gastrointestinal: Negative for nausea, vomiting, abdominal pain, diarrhea and constipation.  Genitourinary: Negative for dysuria, urgency, frequency and hematuria.  Musculoskeletal: Negative for myalgias and arthralgias.  Skin: Negative for rash.  Neurological:  Negative for dizziness, weakness and light-headedness.    Allergies  Review of patient's allergies indicates no known allergies.  Home Medications   Current Outpatient Rx  Name  Route  Sig  Dispense  Refill  . albuterol (PROVENTIL HFA;VENTOLIN HFA) 108 (90 BASE) MCG/ACT inhaler   Inhalation   Inhale 2 puffs into the lungs every 6 (six) hours as needed.         Marland Kitchen EXPIRED: albuterol (PROVENTIL HFA;VENTOLIN HFA) 108 (90 BASE) MCG/ACT inhaler   Inhalation   Inhale 1-2 puffs into the lungs every 6 (six) hours as needed for wheezing.   1 Inhaler   0    BP 154/89  Pulse 117  Temp(Src) 98.6 F (37 C) (Oral)  Resp 28  SpO2 92% Physical Exam  Nursing note and vitals reviewed. Constitutional: He is oriented to person, place, and time. He appears well-developed and well-nourished. No distress.  HENT:  Head: Normocephalic.  Cardiovascular: Regular rhythm.  Tachycardia present.  Exam reveals no gallop and no friction rub.   No murmur heard. Right-sided leg swelling, 53 cm diameter versus 50 cm diameter on the left. Tenderness to palpation in the right calf.  Pulmonary/Chest: Effort normal. No respiratory distress. He has wheezes (diffuse expiratory).  Neurological: He is alert and oriented to person, place, and time. Coordination normal.  Skin: Skin is warm and dry. No rash noted. He is not diaphoretic.  Psychiatric: He has a normal mood and affect. Judgment normal.    ED Course  Procedures (including critical care time) Labs Review Labs Reviewed  CBC WITH DIFFERENTIAL - Abnormal; Notable for the following:  WBC 11.5 (*)    Monocytes Absolute 1.2 (*)    All other components within normal limits  BASIC METABOLIC PANEL - Abnormal; Notable for the following:    Glucose, Bld 101 (*)    All other components within normal limits  D-DIMER, QUANTITATIVE   Imaging Review Dg Chest 2 View  10/24/2012   CLINICAL DATA:  Cough, shortness of breath and wheezing for 2 days, long smoking  history, history asthma  EXAM: CHEST  2 VIEW  COMPARISON:  10/14/2005  FINDINGS: Minimal enlargement of cardiac silhouette.  Mediastinal contours and pulmonary vascularity normal.  Bronchitic changes with slight flattening of diaphragms question chronic bronchitis/COPD.  No definite acute infiltrate, pleural effusion or pneumothorax.  No acute osseous findings.  IMPRESSION: Minimal enlargement of cardiac silhouette.  Bronchitic changes and mild emphysematous changes question related to chronic bronchitis or COPD.  No acute infiltrate.   Electronically Signed   By: Ulyses Southward M.D.   On: 10/24/2012 15:47    Given Duoneb and IM solumedrol with minimal improvement, repeating with 5 ml albuterol  Chest XR read as normal   Labs normal with exception of mild leukocytosis, no left shift.   Still wheezing significantly, repeating albuterol   MDM   1. Wheezing   2. Shortness of breath   3. Right leg swelling    Patient has received 5 breathing treatments with very minimal improvement to auscultation. He does have a right unilateral leg swelling that is chronic for the past year, d-dimer is negative. However, possible chronic DVT and PE. I believe this patient will need a CT of the chest to rule this out. Transferred to the emergency department via shuttle    Graylon Good, PA-C 10/24/12 1710

## 2012-10-24 NOTE — ED Provider Notes (Signed)
Medical screening examination/treatment/procedure(s) were performed by non-physician practitioner and as supervising physician I was immediately available for consultation/collaboration.  Leslee Home, M.D.  Reuben Likes, MD 10/24/12 2031

## 2012-10-25 DIAGNOSIS — J449 Chronic obstructive pulmonary disease, unspecified: Secondary | ICD-10-CM

## 2012-10-25 DIAGNOSIS — R609 Edema, unspecified: Secondary | ICD-10-CM

## 2012-10-25 LAB — BASIC METABOLIC PANEL
BUN: 8 mg/dL (ref 6–23)
Calcium: 9 mg/dL (ref 8.4–10.5)
Chloride: 99 mEq/L (ref 96–112)
GFR calc Af Amer: >90 mL/min (ref >90)
Glucose, Bld: 145 mg/dL — ABNORMAL HIGH (ref 70–99)
Potassium: 4.2 mEq/L (ref 3.5–5.1)

## 2012-10-25 LAB — CBC
HCT: 44.3 % (ref 39.0–52.0)
Hemoglobin: 14.7 g/dL (ref 13.0–17.0)
MCHC: 33.2 g/dL (ref 30.0–36.0)
Platelets: 335 10*3/uL (ref 150–400)
RDW: 14.5 % (ref 11.5–15.5)
WBC: 19.5 10*3/uL — ABNORMAL HIGH (ref 4.0–10.5)

## 2012-10-25 LAB — GLUCOSE, CAPILLARY: Glucose-Capillary: 146 mg/dL — ABNORMAL HIGH (ref 70–99)

## 2012-10-25 MED ORDER — PNEUMOCOCCAL VAC POLYVALENT 25 MCG/0.5ML IJ INJ
0.5000 mL | INJECTION | INTRAMUSCULAR | Status: AC
  Administered 2012-10-26: 0.5 mL via INTRAMUSCULAR
  Filled 2012-10-25: qty 0.5

## 2012-10-25 MED ORDER — ALBUTEROL SULFATE (5 MG/ML) 0.5% IN NEBU
2.5000 mg | INHALATION_SOLUTION | RESPIRATORY_TRACT | Status: DC | PRN
  Administered 2012-10-25: 07:00:00 2.5 mg via RESPIRATORY_TRACT
  Filled 2012-10-25 (×2): qty 0.5

## 2012-10-25 MED ORDER — GUAIFENESIN 100 MG/5ML PO SYRP
200.0000 mg | ORAL_SOLUTION | ORAL | Status: DC | PRN
  Administered 2012-10-25: 200 mg via ORAL
  Filled 2012-10-25 (×2): qty 10

## 2012-10-25 MED ORDER — INFLUENZA VAC SPLIT QUAD 0.5 ML IM SUSP
0.5000 mL | INTRAMUSCULAR | Status: AC
  Administered 2012-10-26: 0.5 mL via INTRAMUSCULAR
  Filled 2012-10-25: qty 0.5

## 2012-10-25 NOTE — Progress Notes (Signed)
*  PRELIMINARY RESULTS* Vascular Ultrasound Right lower extremity venous duplex  has been completed.  Preliminary findings: no obvious evidence of DVT.   Farrel Demark, RDMS, RVT  10/25/2012, 12:06 PM

## 2012-10-25 NOTE — Progress Notes (Signed)
[facility] PROGRESS NOTE  Samuel [name] YQM:[id number] DOB: [birth year] DOA: 10/24/2012 PCP: No PCP Per Patient  Assessment/Plan: 42 y.o. male with a history of Asthma/COPD who presents to the ED with complaints of Worsening SOB, and Wheezing  -CTA done in ED neg for PE   1. Acute on chronic COPD exacerbation; CTA neg for PE: active smoker  -leukocytosis likely COPD+steroids; afebrile  -cont IV Steroid Taper, DuoNebs, O2, and Empiric Levaquin.   2. Hypoxemia- Same as #1.   3. Edema of Right Leg> L chronic, pend Venous Duplex Doppelr of RLE to evaluate for a DVT.  -obtain echo   4. Morbid Obesity; recommended weight loss   5. Tobacco Use Disorder- Smoking Cessation Discussed   7. DVT prophylaxis with Lovenox.   Code Status: full  Family Communication: family at the bedside  (indicate person spoken with, relationship, and if by phone, the number) Disposition Plan: home    Consultants:  Non   Procedures:  CT chest   Antibiotics:  Levofloxacin 9/27 (indicate start date, and stop date if known)  HPI/Subjective: Alert, oriented   Objective: Filed Vitals:   10/25/12 0524  BP: 134/60  Pulse: 110  Temp: 98.4 F (36.9 C)  Resp: 16    Intake/Output Summary (Last 24 hours) at 10/25/12 0925 Last data filed at 10/25/12 0536  Gross per 24 hour  Intake    480 ml  Output   1050 ml  Net   -570 ml   Filed Weights   10/24/12 2252  Weight: 156.2 kg (344 lb 5.7 oz)    Exam:   General:  Alert   Cardiovascular: s1,s2 rrr  Respiratory: decreased AE BL; wheezing   Abdomen: soft, NT   Musculoskeletal: LE edema; R > L chronic dermatitis    Data Reviewed: Basic Metabolic Panel:  Recent Labs Lab 10/24/12 1508 10/25/12 0447  NA 136 135  K 3.8 4.2  CL 99 99  CO2 26 23  GLUCOSE 101* 145*  BUN 8 8  CREATININE 0.97 0.88  CALCIUM 9.0 9.0   Liver Function Tests: No results found for this basename: AST, ALT, ALKPHOS, BILITOT, PROT, ALBUMIN,  in the  last 168 hours No results found for this basename: LIPASE, AMYLASE,  in the last 168 hours No results found for this basename: AMMONIA,  in the last 168 hours CBC:  Recent Labs Lab 10/24/12 1508 10/25/12 0447  WBC 11.5* 19.5*  NEUTROABS 7.5  --   HGB 14.9 14.7  HCT 44.2 44.3  MCV 83.7 84.2  PLT 311 335   Cardiac Enzymes: No results found for this basename: CKTOTAL, CKMB, CKMBINDEX, TROPONINI,  in the last 168 hours BNP (last 3 results) No results found for this basename: PROBNP,  in the last 8760 hours CBG: No results found for this basename: GLUCAP,  in the last 168 hours  No results found for this or any previous visit (from the past 240 hour(s)).   Studies: Dg Chest 2 View  10/24/2012   CLINICAL DATA:  Cough, shortness of breath and wheezing for 2 days, long smoking history, history asthma  EXAM: CHEST  2 VIEW  COMPARISON:  10/14/2005  FINDINGS: Minimal enlargement of cardiac silhouette.  Mediastinal contours and pulmonary vascularity normal.  Bronchitic changes with slight flattening of diaphragms question chronic bronchitis/COPD.  No definite acute infiltrate, pleural effusion or pneumothorax.  No acute osseous findings.  IMPRESSION: Minimal enlargement of cardiac silhouette.  Bronchitic changes and mild emphysematous changes question related to chronic bronchitis  or COPD.  No acute infiltrate.   Electronically Signed   By: Ulyses Southward M.D.   On: 10/24/2012 15:47   Ct Angio Chest W/cm &/or Wo Cm  10/24/2012   *RADIOLOGY REPORT*  Clinical Data: 42 year old male with shortness of breath and chest discomfort.  Wheezing and cough.  CT ANGIOGRAPHY CHEST  Technique:  Multidetector CT imaging of the chest using the standard protocol during bolus administration of intravenous contrast. Multiplanar reconstructed images including MIPs were obtained and reviewed to evaluate the vascular anatomy.  Contrast:  80 ml intravenous Omnipaque 350  Comparison: 04/22/2005 CT  Findings: This study is  technically borderline secondary to patient respiratory motion and body habitus.  No pulmonary emboli are identified. Cardiomegaly is present. There is no evidence of thoracic aortic aneurysm or definite dissection.  No pleural or pericardial effusions identified. No enlarged lymph nodes are identified.  Mild to moderate bibasilar atelectasis is noted. There is no evidence of airspace disease, mass, consolidation or endobronchial/endotracheal lesion. There is no evidence of mediastinal hematoma or pneumothorax.  No acute or suspicious bony abnormalities are present. The visualized upper abdomen is unremarkable.  IMPRESSION: No definite pulmonary emboli but slightly decreased sensitivity secondary to respiratory motion artifact and body habitus.  Mild to moderate bibasilar atelectasis.  Cardiomegaly.   Original Report Authenticated By: Harmon Pier, M.D.    Scheduled Meds: . ipratropium  0.5 mg Nebulization Q4H   And  . albuterol  2.5 mg Nebulization Q4H  . enoxaparin (LOVENOX) injection  80 mg Subcutaneous Q24H  . [START ON 10/26/2012] influenza vac split quadrivalent PF  0.5 mL Intramuscular Tomorrow-1000  . levofloxacin (LEVAQUIN) IV  500 mg Intravenous Q24H  . methylPREDNISolone (SOLU-MEDROL) injection  80 mg Intravenous Q12H  . pantoprazole  40 mg Oral Daily  . [START ON 10/26/2012] pneumococcal 23 valent vaccine  0.5 mL Intramuscular Tomorrow-1000   Continuous Infusions: . sodium chloride 100 mL (10/25/12 0407)    Principal Problem:   COPD exacerbation Active Problems:   Hypoxemia   Acute upper respiratory infections of unspecified site   Tobacco use disorder   Morbid obesity   Edema leg    Time spent: > 35 minutes     Esperanza Sheets  [facility] Pager [phone]. If 7PM-7AM, please contact night-coverage at [email or web address], password [facility] 10/25/2012, 9:25 AM  LOS: 1 day

## 2012-10-25 NOTE — ED Provider Notes (Signed)
Medical screening examination/treatment/procedure(s) were conducted as a shared visit with non-physician practitioner(s) and myself.  I personally evaluated the patient during the encounter  Samuel [name]. Samuel Payor, MD 10/25/12 1100

## 2012-10-26 LAB — CBC
MCH: 29.1 pg (ref 26.0–34.0)
MCV: 84.1 fL (ref 78.0–100.0)
Platelets: 332 10*3/uL (ref 150–400)
RBC: 5.09 MIL/uL (ref 4.22–5.81)
WBC: 31.1 10*3/uL — ABNORMAL HIGH (ref 4.0–10.5)

## 2012-10-26 MED ORDER — ENOXAPARIN SODIUM 80 MG/0.8ML ~~LOC~~ SOLN
80.0000 mg | SUBCUTANEOUS | Status: DC
  Administered 2012-10-27: 80 mg via SUBCUTANEOUS
  Filled 2012-10-26: qty 0.8

## 2012-10-26 MED ORDER — METHYLPREDNISOLONE SODIUM SUCC 125 MG IJ SOLR
60.0000 mg | Freq: Two times a day (BID) | INTRAMUSCULAR | Status: DC
  Administered 2012-10-26 – 2012-10-27 (×3): 60 mg via INTRAVENOUS
  Filled 2012-10-26 (×5): qty 0.96

## 2012-10-26 MED ORDER — ENOXAPARIN SODIUM 40 MG/0.4ML ~~LOC~~ SOLN
40.0000 mg | SUBCUTANEOUS | Status: DC
  Administered 2012-10-26: 40 mg via SUBCUTANEOUS
  Filled 2012-10-26 (×2): qty 0.4

## 2012-10-26 NOTE — Care Management Note (Signed)
    Page 1 of 1   10/27/2012     12:10:12 PM   CARE MANAGEMENT NOTE 10/27/2012  Patient:  Samuel [name], Samuel [name]   Account Number:  [id number]  Date Initiated:  10/26/2012  Documentation initiated by:  Letha Cape  Subjective/Objective Assessment:   dx copd ex  admit- lives with family.     Action/Plan:   Anticipated DC Date:  10/27/2012   Anticipated DC Plan:  HOME/SELF CARE      DC Planning Services  CM consult  MATCH Program  [facility]      Choice offered to / List presented to:             Status of service:  Completed, signed off Medicare Important Message given?   (If response is "NO", the following Medicare IM given date Samuel [name] will be blank) Date Medicare IM given:   Date Additional Medicare IM given:    Discharge Disposition:  HOME/SELF CARE  Per UR Regulation:    If discussed at Long Length of Stay Meetings, dates discussed:    Comments:  10/27/12 10:49 Letha Cape RN, BSN [phone] patient is for dc today, patient has f/u apt with [facility] and he states he will need ast with medications if they are not on the $4 [facility] list. Informed patient he could only use Match Program once, he understood, so if he can get all meds on $4 he will not need med ast.  Most of patient's meds are on the $4 list at [facility] of them=$24) plus levaquin 5 tabs  57.41 total comes to $81.41, patient states he can afford this amount. Informed RN.  Patient states he has transportation home.  10/26/12 15:43 Letha Cape RN,BSN [phone] patient lives with family, pta indep.  NCM will continue to follow for dc needs.

## 2012-10-26 NOTE — Progress Notes (Addendum)
[facility] PROGRESS NOTE  ARIF AMENDOLA WUX:[id number] DOB: 16-Oct-1970 DOA: 10/24/2012 PCP: No PCP Per Patient  Assessment/Plan: 42 y.o. male with a history of Asthma/COPD who presents to the ED with complaints of Worsening SOB, and Wheezing  -CTA done in ED neg for PE   1. Acute on chronic COPD exacerbation; CTA neg for PE: active smoker  -leukocytosis likely COPD+steroids; afebrile; monitor  -cont IV Steroid Taper, DuoNebs, O2, and Empiric Levaquin.   2. Hypoxemia- Same as #1.   3. Edema of Right Leg> L chronic, Duplex Doppelr neg DVT.  -obtain echo; MRI leg   4. Morbid Obesity; recommended weight loss   5. Tobacco Use Disorder- Smoking Cessation Discussed   7. DVT prophylaxis with Lovenox.   Code Status: full  Family Communication: family at the bedside  (indicate person spoken with, relationship, and if by phone, the number) Disposition Plan: home when medically stable    Consultants:  Non   Procedures:  CT chest   Antibiotics:  Levofloxacin 9/27 (indicate start date, and stop date if known)  HPI/Subjective: Alert, oriented   Objective: Filed Vitals:   10/26/12 0409  BP: 130/68  Pulse: 98  Temp: 97.8 F (36.6 C)  Resp: 20    Intake/Output Summary (Last 24 hours) at 10/26/12 0750 Last data filed at 10/26/12 0412  Gross per 24 hour  Intake      0 ml  Output    700 ml  Net   -700 ml   Filed Weights   10/24/12 2252  Weight: 156.2 kg (344 lb 5.7 oz)    Exam:   General:  Alert   Cardiovascular: s1,s2 rrr  Respiratory: decreased AE BL; wheezing   Abdomen: soft, NT   Musculoskeletal: LE edema; R > L chronic dermatitis    Data Reviewed: Basic Metabolic Panel:  Recent Labs Lab 10/24/12 1508 10/25/12 0447  NA 136 135  K 3.8 4.2  CL 99 99  CO2 26 23  GLUCOSE 101* 145*  BUN 8 8  CREATININE 0.97 0.88  CALCIUM 9.0 9.0   Liver Function Tests: No results found for this basename: AST, ALT, ALKPHOS, BILITOT, PROT, ALBUMIN,   in the last 168 hours No results found for this basename: LIPASE, AMYLASE,  in the last 168 hours No results found for this basename: AMMONIA,  in the last 168 hours CBC:  Recent Labs Lab 10/24/12 1508 10/25/12 0447 10/26/12 0430  WBC 11.5* 19.5* 31.1*  NEUTROABS 7.5  --   --   HGB 14.9 14.7 14.8  HCT 44.2 44.3 42.8  MCV 83.7 84.2 84.1  PLT 311 335 332   Cardiac Enzymes: No results found for this basename: CKTOTAL, CKMB, CKMBINDEX, TROPONINI,  in the last 168 hours BNP (last 3 results) No results found for this basename: PROBNP,  in the last 8760 hours CBG:  Recent Labs Lab 10/25/12 2240  GLUCAP 146*    No results found for this or any previous visit (from the past 240 hour(s)).   Studies: Dg Chest 2 View  10/24/2012   CLINICAL DATA:  Cough, shortness of breath and wheezing for 2 days, long smoking history, history asthma  EXAM: CHEST  2 VIEW  COMPARISON:  10/14/2005  FINDINGS: Minimal enlargement of cardiac silhouette.  Mediastinal contours and pulmonary vascularity normal.  Bronchitic changes with slight flattening of diaphragms question chronic bronchitis/COPD.  No definite acute infiltrate, pleural effusion or pneumothorax.  No acute osseous findings.  IMPRESSION: Minimal enlargement of cardiac silhouette.  Bronchitic  changes and mild emphysematous changes question related to chronic bronchitis or COPD.  No acute infiltrate.   Electronically Signed   By: Ulyses Southward M.D.   On: 10/24/2012 15:47   Ct Angio Chest W/cm &/or Wo Cm  10/24/2012   *RADIOLOGY REPORT*  Clinical Data: 42 year old male with shortness of breath and chest discomfort.  Wheezing and cough.  CT ANGIOGRAPHY CHEST  Technique:  Multidetector CT imaging of the chest using the standard protocol during bolus administration of intravenous contrast. Multiplanar reconstructed images including MIPs were obtained and reviewed to evaluate the vascular anatomy.  Contrast:  80 ml intravenous Omnipaque 350  Comparison:  04/22/2005 CT  Findings: This study is technically borderline secondary to patient respiratory motion and body habitus.  No pulmonary emboli are identified. Cardiomegaly is present. There is no evidence of thoracic aortic aneurysm or definite dissection.  No pleural or pericardial effusions identified. No enlarged lymph nodes are identified.  Mild to moderate bibasilar atelectasis is noted. There is no evidence of airspace disease, mass, consolidation or endobronchial/endotracheal lesion. There is no evidence of mediastinal hematoma or pneumothorax.  No acute or suspicious bony abnormalities are present. The visualized upper abdomen is unremarkable.  IMPRESSION: No definite pulmonary emboli but slightly decreased sensitivity secondary to respiratory motion artifact and body habitus.  Mild to moderate bibasilar atelectasis.  Cardiomegaly.   Original Report Authenticated By: Harmon Pier, M.D.    Scheduled Meds: . ipratropium  0.5 mg Nebulization Q4H   And  . albuterol  2.5 mg Nebulization Q4H  . enoxaparin (LOVENOX) injection  80 mg Subcutaneous Q24H  . influenza vac split quadrivalent PF  0.5 mL Intramuscular Tomorrow-1000  . levofloxacin (LEVAQUIN) IV  500 mg Intravenous Q24H  . pantoprazole  40 mg Oral Daily  . pneumococcal 23 valent vaccine  0.5 mL Intramuscular Tomorrow-1000   Continuous Infusions: . sodium chloride 100 mL (10/25/12 0407)    Principal Problem:   COPD exacerbation Active Problems:   Hypoxemia   Acute upper respiratory infections of unspecified site   Tobacco use disorder   Morbid obesity   Edema leg    Time spent: > 35 minutes     Esperanza Sheets  [facility] Pager [phone]. If 7PM-7AM, please contact night-coverage at [email or web address], password [facility] 10/26/2012, 7:50 AM  LOS: 2 days

## 2012-10-26 NOTE — Progress Notes (Signed)
  Echocardiogram 2D Echocardiogram has been performed.  Yetunde Leis FRANCES 10/26/2012, 6:33 PM

## 2012-10-27 ENCOUNTER — Inpatient Hospital Stay (HOSPITAL_COMMUNITY): Payer: No Typology Code available for payment source

## 2012-10-27 LAB — CBC
HCT: 44 % (ref 39.0–52.0)
MCHC: 34.8 g/dL (ref 30.0–36.0)
Platelets: 349 10*3/uL (ref 150–400)
RBC: 5.23 MIL/uL (ref 4.22–5.81)
RDW: 14.8 % (ref 11.5–15.5)
WBC: 24.8 10*3/uL — ABNORMAL HIGH (ref 4.0–10.5)

## 2012-10-27 MED ORDER — IPRATROPIUM BROMIDE 0.02 % IN SOLN: 0.5000 mg | RESPIRATORY_TRACT | Status: DC

## 2012-10-27 MED ORDER — ALBUTEROL SULFATE HFA 108 (90 BASE) MCG/ACT IN AERS: 2.0000 | INHALATION_SPRAY | Freq: Four times a day (QID) | RESPIRATORY_TRACT | Status: DC | PRN

## 2012-10-27 MED ORDER — POTASSIUM CHLORIDE ER 10 MEQ PO TBCR: 20.0000 meq | EXTENDED_RELEASE_TABLET | Freq: Two times a day (BID) | ORAL | Status: DC

## 2012-10-27 MED ORDER — ALBUTEROL SULFATE (5 MG/ML) 0.5% IN NEBU: 2.5000 mg | INHALATION_SOLUTION | RESPIRATORY_TRACT | Status: DC | PRN

## 2012-10-27 MED ORDER — GADOBENATE DIMEGLUMINE 529 MG/ML IV SOLN
20.0000 mL | Freq: Once | INTRAVENOUS | Status: AC
  Administered 2012-10-27: 09:00:00 20 mL via INTRAVENOUS

## 2012-10-27 MED ORDER — TIOTROPIUM BROMIDE MONOHYDRATE 18 MCG IN CAPS: 18.0000 ug | ORAL_CAPSULE | Freq: Every day | RESPIRATORY_TRACT | Status: DC

## 2012-10-27 MED ORDER — PREDNISONE 20 MG PO TABS: 20.0000 mg | ORAL_TABLET | Freq: Every day | ORAL | Status: DC

## 2012-10-27 MED ORDER — LEVOFLOXACIN 500 MG PO TABS
500.0000 mg | ORAL_TABLET | Freq: Every day | ORAL | Status: DC
  Filled 2012-10-27: qty 1

## 2012-10-27 MED ORDER — FUROSEMIDE 20 MG PO TABS: 20.0000 mg | ORAL_TABLET | Freq: Every day | ORAL | Status: DC

## 2012-10-27 MED ORDER — LEVOFLOXACIN 500 MG PO TABS: 500.0000 mg | ORAL_TABLET | Freq: Every day | ORAL | Status: DC

## 2012-10-27 NOTE — Progress Notes (Signed)
Utilization review complete. Mahamadou Weltz RN CCM  

## 2012-10-27 NOTE — Discharge Summary (Signed)
Physician Discharge Summary  Samuel [name]:[id number] DOB: Aug 10, 1970 DOA: 10/24/2012  PCP: No PCP Per Patient  Admit date: 10/24/2012 Discharge date: 10/27/2012  Time spent: >35 minutes  Recommendations for Outpatient Follow-up:   F/u with PCP in 2 weeks   Discharge Diagnoses:  Principal Problem:   COPD exacerbation Active Problems:   Hypoxemia   Acute upper respiratory infections of unspecified site   Tobacco use disorder   Morbid obesity   Edema leg   SOB (shortness of breath)   Discharge Condition: stable   Diet recommendation: regular   Filed Weights   10/24/12 2252  Weight: 156.2 kg (344 lb 5.7 oz)    History of present illness:  42 y.o. male with a history of Asthma/COPD who presents to the ED with complaints of Worsening SOB, and Wheezing -CTA done in ED neg for PE Admitted with acute COPD exacerbation    Hospital Course:     1. Acute on chronic COPD exacerbation; CTA neg for PE: active smoker  -leukocytosis likely COPD+steroids; afebrile;improving  -clinically imporved on IV Steroid Taper, DuoNebs, O2, and Empiric Levaquin; d/c on tapering steroids; recommended to stop smoking; PFTs outpatient  2. Hypoxemia- Same as #1.  3. Edema of Right Leg> L chronic, Duplex Doppelr neg DVT. MRI ned,but some edema; -echo: LVEF 60%; diastolic dysfunction; clinically euvolemic;  given lasix short term;  4. Morbid Obesity; recommended weight loss  5. Tobacco Use Disorder- Smoking Cessation Discussed     Procedures:  CTA: as above  (i.e. Studies not automatically included, echos, thoracentesis, etc; not x-rays)  Consultations:  No   Discharge Exam: Filed Vitals:   10/27/12 1009  BP: 148/65  Pulse: 110  Temp: 98.4 F (36.9 C)  Resp: 22    General: alert, oriented  Cardiovascular: S1,S2 rrr Respiratory: improved ventilation   Discharge Instructions  Discharge Orders   Future Appointments Provider Department Dept Phone   11/18/2012 10:00 AM  [facility] Financial Counselor [facility] [phone]   11/18/2012 10:45 AM [facility] Covering Provider [facility] [place] [phone]   Future Orders Complete By Expires   Diet - low sodium heart healthy  As directed    Discharge instructions  As directed    Comments:     Follow up with primary care doctor in 2-3 weeks   Increase activity slowly  As directed        Medication List         albuterol 108 (90 BASE) MCG/ACT inhaler  Commonly known as:  PROVENTIL HFA;VENTOLIN HFA  Inhale 2 puffs into the lungs every 6 (six) hours as needed for wheezing or shortness of breath.     albuterol (5 MG/ML) 0.5% nebulizer solution  Commonly known as:  PROVENTIL  Take 0.5 mLs (2.5 mg total) by nebulization every 2 (two) hours as needed for wheezing or shortness of breath.     aspirin EC 325 MG tablet  Take 325 mg by mouth daily as needed for pain.     furosemide 20 MG tablet  Commonly known as:  LASIX  Take 1 tablet (20 mg total) by mouth daily.     hydrocortisone cream 1 %  Apply 1 application topically daily as needed (rash).     levofloxacin 500 MG tablet  Commonly known as:  LEVAQUIN  Take 1 tablet (500 mg total) by mouth daily.     neomycin-bacitracin-polymyxin Oint  Commonly known as:  NEOSPORIN  Apply 1 application topically 2 (two)  times daily as needed (wound care).     potassium chloride 10 MEQ tablet  Commonly known as:  K-DUR  Take 2 tablets (20 mEq total) by mouth 2 (two) times daily.     predniSONE 20 MG tablet  Commonly known as:  DELTASONE  Take 1 tablet (20 mg total) by mouth daily.     tiotropium 18 MCG inhalation capsule  Commonly known as:  SPIRIVA HANDIHALER  Place 1 capsule (18 mcg total) into inhaler and inhale daily.       No Known Allergies     Follow-up Information   Follow up with [facility] On 11/18/2012. (10:45 for hospital f/u, 10 am for orange card appt,  please bring completed paperwork)    Contact information:   [address] [place] [address] [phone]      Follow up with No PCP Per Patient In 1 week.   Specialty:  General Practice   Contact information:   [address] [place] [address] [phone]        The results of significant diagnostics from this hospitalization (including imaging, microbiology, ancillary and laboratory) are listed below for reference.    Significant Diagnostic Studies: Dg Chest 2 View  10/24/2012   CLINICAL DATA:  Cough, shortness of breath and wheezing for 2 days, long smoking history, history asthma  EXAM: CHEST  2 VIEW  COMPARISON:  10/14/2005  FINDINGS: Minimal enlargement of cardiac silhouette.  Mediastinal contours and pulmonary vascularity normal.  Bronchitic changes with slight flattening of diaphragms question chronic bronchitis/COPD.  No definite acute infiltrate, pleural effusion or pneumothorax.  No acute osseous findings.  IMPRESSION: Minimal enlargement of cardiac silhouette.  Bronchitic changes and mild emphysematous changes question related to chronic bronchitis or COPD.  No acute infiltrate.   Electronically Signed   By: Ulyses Southward M.D.   On: 10/24/2012 15:47   Ct Angio Chest W/cm &/or Wo Cm  10/24/2012   *RADIOLOGY REPORT*  Clinical Data: 42 year old male with shortness of breath and chest discomfort.  Wheezing and cough.  CT ANGIOGRAPHY CHEST  Technique:  Multidetector CT imaging of the chest using the standard protocol during bolus administration of intravenous contrast. Multiplanar reconstructed images including MIPs were obtained and reviewed to evaluate the vascular anatomy.  Contrast:  80 ml intravenous Omnipaque 350  Comparison: 04/22/2005 CT  Findings: This study is technically borderline secondary to patient respiratory motion and body habitus.  No pulmonary emboli are identified. Cardiomegaly is present. There is no evidence of thoracic aortic aneurysm or definite  dissection.  No pleural or pericardial effusions identified. No enlarged lymph nodes are identified.  Mild to moderate bibasilar atelectasis is noted. There is no evidence of airspace disease, mass, consolidation or endobronchial/endotracheal lesion. There is no evidence of mediastinal hematoma or pneumothorax.  No acute or suspicious bony abnormalities are present. The visualized upper abdomen is unremarkable.  IMPRESSION: No definite pulmonary emboli but slightly decreased sensitivity secondary to respiratory motion artifact and body habitus.  Mild to moderate bibasilar atelectasis.  Cardiomegaly.   Original Report Authenticated By: Harmon Pier, M.D.   Mr Tibia Fibula Right W Wo Contrast  10/27/2012   CLINICAL DATA:  Chronic edema of the right lower leg.  EXAM: MRI OF LOWER RIGHT EXTREMITY WITHOUT AND WITH CONTRAST  TECHNIQUE: Multiplanar, multisequence MR imaging of the lower right extremity was performed both before and after administration of intravenous contrast.  CONTRAST:  20 cc MultiHance  COMPARISON:  None.  FINDINGS: The tibia and fibula are normal. The muscles of the right lower leg are normal as are the vessels. There is edema in the subcutaneous tissues of the anterior aspect of right lower leg extending medially with probable chronic thickening of the septa between the fat lobules. There is similar but less prominent edema in the left lower leg.  There is no mass and there are no varicosities.  IMPRESSION: Nonspecific subcutaneous edema in the anterior and medial aspects of the proximal right lower leg of indeterminate etiology. Otherwise normal exam. No pathologic enhancement after contrast administration.   Electronically Signed   By: Geanie Cooley   On: 10/27/2012 09:20    Microbiology: No results found for this or any previous visit (from the past 240 hour(s)).   Labs: Basic Metabolic Panel:  Recent Labs Lab 10/24/12 1508 10/25/12 0447  NA 136 135  K 3.8 4.2  CL 99 99  CO2 26 23   GLUCOSE 101* 145*  BUN 8 8  CREATININE 0.97 0.88  CALCIUM 9.0 9.0   Liver Function Tests: No results found for this basename: AST, ALT, ALKPHOS, BILITOT, PROT, ALBUMIN,  in the last 168 hours No results found for this basename: LIPASE, AMYLASE,  in the last 168 hours No results found for this basename: AMMONIA,  in the last 168 hours CBC:  Recent Labs Lab 10/24/12 1508 10/25/12 0447 10/26/12 0430 10/27/12 0538  WBC 11.5* 19.5* 31.1* 24.8*  NEUTROABS 7.5  --   --   --   HGB 14.9 14.7 14.8 15.3  HCT 44.2 44.3 42.8 44.0  MCV 83.7 84.2 84.1 84.1  PLT 311 335 332 349   Cardiac Enzymes: No results found for this basename: CKTOTAL, CKMB, CKMBINDEX, TROPONINI,  in the last 168 hours BNP: BNP (last 3 results) No results found for this basename: PROBNP,  in the last 8760 hours CBG:  Recent Labs Lab 10/25/12 2240  GLUCAP 146*       Signed:  Darina Hartwell N  [facility] 10/27/2012, 10:41 AM

## 2012-10-27 NOTE — Progress Notes (Signed)
Discharge instructions reviewed with patient. Prescriptions delivered to patient. PIV removed. Pt to be discharged to home with family. Waiting on ride.  Samuel [name], Samuel [name] [place]

## 2012-11-18 ENCOUNTER — Encounter: Payer: Self-pay | Admitting: Internal Medicine

## 2012-11-18 ENCOUNTER — Ambulatory Visit: Payer: Self-pay | Attending: Internal Medicine

## 2012-11-18 ENCOUNTER — Ambulatory Visit: Payer: No Typology Code available for payment source | Attending: Internal Medicine | Admitting: Internal Medicine

## 2012-11-18 VITALS — BP 151/89 | HR 112 | Temp 99.5°F | Resp 14 | Ht 69.0 in | Wt 345.0 lb

## 2012-11-18 DIAGNOSIS — R609 Edema, unspecified: Secondary | ICD-10-CM

## 2012-11-18 DIAGNOSIS — J441 Chronic obstructive pulmonary disease with (acute) exacerbation: Secondary | ICD-10-CM

## 2012-11-18 DIAGNOSIS — J449 Chronic obstructive pulmonary disease, unspecified: Secondary | ICD-10-CM | POA: Insufficient documentation

## 2012-11-18 DIAGNOSIS — R600 Localized edema: Secondary | ICD-10-CM

## 2012-11-18 DIAGNOSIS — J4489 Other specified chronic obstructive pulmonary disease: Secondary | ICD-10-CM | POA: Insufficient documentation

## 2012-11-18 DIAGNOSIS — I10 Essential (primary) hypertension: Secondary | ICD-10-CM | POA: Insufficient documentation

## 2012-11-18 DIAGNOSIS — Z0000 Encounter for general adult medical examination without abnormal findings: Secondary | ICD-10-CM

## 2012-11-18 DIAGNOSIS — Z8679 Personal history of other diseases of the circulatory system: Secondary | ICD-10-CM

## 2012-11-18 MED ORDER — IPRATROPIUM BROMIDE 0.02 % IN SOLN: 0.5000 mg | RESPIRATORY_TRACT | Status: DC

## 2012-11-18 MED ORDER — ALBUTEROL SULFATE HFA 108 (90 BASE) MCG/ACT IN AERS: 2.0000 | INHALATION_SPRAY | Freq: Four times a day (QID) | RESPIRATORY_TRACT | Status: DC | PRN

## 2012-11-18 MED ORDER — HYDROCOD POLST-CHLORPHEN POLST 10-8 MG/5ML PO LQCR: 5.0000 mL | Freq: Two times a day (BID) | ORAL | Status: DC | PRN

## 2012-11-18 MED ORDER — LISINOPRIL-HYDROCHLOROTHIAZIDE 20-25 MG PO TABS: 1.0000 | ORAL_TABLET | Freq: Every day | ORAL | Status: DC

## 2012-11-18 MED ORDER — ALBUTEROL SULFATE (5 MG/ML) 0.5% IN NEBU: 2.5000 mg | INHALATION_SOLUTION | RESPIRATORY_TRACT | Status: DC | PRN

## 2012-11-18 NOTE — Progress Notes (Signed)
HFU Pt was admitted to the hospital with COPD three weeks ago. Pt reports that he is not getting sleep due to waking up coughing. He said that night times are the worst. During the day time certain smell or perfumes causes coughing spells. Pt states the he is still having swelling of the legs.

## 2012-11-18 NOTE — Patient Instructions (Signed)

## 2012-11-18 NOTE — Progress Notes (Signed)
Patient ID: Samuel [name], male   DOB: 14-Dec-1970, 42 y.o.   MRN: [id number]  CC: Followup for COPD  HPI: 42 year old male with past medical history of COPD who was recently hospitalized for COPD exacerbation comes in for followup. Patient reported he ran out of the inhaler and nebulizers. He otherwise feels fine. He does report occasional shortness of breath during the nighttime. He does have chronic dry cough. No chest pains or palpitations. No fevers or chills.  No Known Allergies Past Medical History  Diagnosis Date  . Asthma    Current Outpatient Prescriptions on File Prior to Visit  Medication Sig Dispense Refill  . aspirin EC 325 MG tablet Take 325 mg by mouth daily as needed for pain.      . hydrocortisone cream 1 % Apply 1 application topically daily as needed (rash).      . neomycin-bacitracin-polymyxin (NEOSPORIN) OINT Apply 1 application topically 2 (two) times daily as needed (wound care).       No current facility-administered medications on file prior to visit.   Hypertension in mother.  History   Social History  . Marital Status: Single    Spouse Name: N/A    Number of Children: N/A  . Years of Education: N/A   Occupational History  . Not on file.   Social History Main Topics  . Smoking status: Former Smoker -- 1.00 packs/day    Quit date: 10/28/2012  . Smokeless tobacco: Not on file  . Alcohol Use: No  . Drug Use: No  . Sexual Activity: Not on file   Other Topics Concern  . Not on file   Social History Narrative  . No narrative on file    Review of Systems  Constitutional: Negative for fever, chills, diaphoresis, activity change, appetite change and fatigue.  HENT: Negative for ear pain, nosebleeds, congestion, facial swelling, rhinorrhea, neck pain, neck stiffness and ear discharge.   Eyes: Negative for pain, discharge, redness, itching and visual disturbance.  Respiratory: Negative for cough, choking, chest tightness, shortness of breath,  wheezing and stridor.   Cardiovascular: Negative for chest pain, palpitations and leg swelling.  Gastrointestinal: Negative for abdominal distention.  Genitourinary: Negative for dysuria, urgency, frequency, hematuria, flank pain, decreased urine volume, difficulty urinating and dyspareunia.  Musculoskeletal: Negative for back pain, joint swelling, arthralgias and gait problem.  Neurological: Negative for dizziness, tremors, seizures, syncope, facial asymmetry, speech difficulty, weakness, light-headedness, numbness and headaches.  Hematological: Negative for adenopathy. Does not bruise/bleed easily.  Psychiatric/Behavioral: Negative for hallucinations, behavioral problems, confusion, dysphoric mood, decreased concentration and agitation.    Objective:   Filed Vitals:   11/18/12 1038  BP: 151/89  Pulse: 112  Temp: 99.5 F (37.5 C)  Resp: 14    Physical Exam  Constitutional: Appears well-developed and well-nourished. No distress.  HENT: Normocephalic. External right and left ear normal. Oropharynx is clear and moist.  Eyes: Conjunctivae and EOM are normal. PERRLA, no scleral icterus.  Neck: Normal ROM. Neck supple. No JVD. No tracheal deviation. No thyromegaly.  CVS: RRR, S1/S2 +, no murmurs, no gallops, no carotid bruit.  Pulmonary: Effort and breath sounds normal, no stridor, rhonchi, wheezes, rales.  Abdominal: Soft. BS +,  no distension, tenderness, rebound or guarding.  Musculoskeletal: Normal range of motion. +1 LE pitting edema and no tenderness.  Lymphadenopathy: No lymphadenopathy noted, cervical, inguinal. Neuro: Alert. Normal reflexes, muscle tone coordination. No cranial nerve deficit. Skin: Skin is warm and dry. No rash noted. Not diaphoretic. No erythema. No  pallor.  Psychiatric: Normal mood and affect. Behavior, judgment, thought content normal.   Lab Results  Component Value Date   WBC 24.8* 10/27/2012   HGB 15.3 10/27/2012   HCT 44.0 10/27/2012   MCV 84.1  10/27/2012   PLT 349 10/27/2012   Lab Results  Component Value Date   CREATININE 0.88 10/25/2012   BUN 8 10/25/2012   NA 135 10/25/2012   K 4.2 10/25/2012   CL 99 10/25/2012   CO2 23 10/25/2012    No results found for this basename: HGBA1C   Lipid Panel  No results found for this basename: chol, trig, hdl, cholhdl, vldl, ldlcalc       Assessment and plan:   Patient Active Problem List   Diagnosis Date Noted  . HTN (hypertension) 11/18/2012    Priority: High - Start lisinopril-HCTZ once a day  - follow up in 1 month at which time we will recheck BP and obtain blood work  - We have discussed target BP range - I have advised pt to check BP regularly and to call us back if the numbers are higher than 140/90 - discussed the importance of compliance with medical therapy and diet   . Preventative health care 11/18/2012    Priority: Medium - had flu and pneumonia shot during recent hospitalization   . COPD exacerbation 10/24/2012    Priority: Medium - Referral provided to pulmonologist  - Prescriptions provided for albuterol and Atrovent nebulizers and albuterol inhaler.  - Prescription provided for Tussionex for dry cough   . Morbid obesity 10/24/2012    Priority: Medium - I have discussed nutrition with the patient   . Edema leg 10/24/2012    Priority: Medium - improved; was on lasix but will not continue lasix. Will prescribe prinizide for better BP control

## 2012-11-20 ENCOUNTER — Other Ambulatory Visit: Payer: Self-pay | Admitting: *Deleted

## 2012-11-20 ENCOUNTER — Encounter: Payer: Self-pay | Admitting: *Deleted

## 2012-11-20 DIAGNOSIS — R609 Edema, unspecified: Secondary | ICD-10-CM

## 2012-11-23 ENCOUNTER — Ambulatory Visit (INDEPENDENT_AMBULATORY_CARE_PROVIDER_SITE_OTHER): Payer: No Typology Code available for payment source | Admitting: Internal Medicine

## 2012-11-23 ENCOUNTER — Encounter: Payer: Self-pay | Admitting: Internal Medicine

## 2012-11-23 VITALS — BP 112/80 | HR 114 | Temp 98.0°F | Ht 69.0 in | Wt 344.0 lb

## 2012-11-23 DIAGNOSIS — J449 Chronic obstructive pulmonary disease, unspecified: Secondary | ICD-10-CM

## 2012-11-23 DIAGNOSIS — I10 Essential (primary) hypertension: Secondary | ICD-10-CM

## 2012-11-23 MED ORDER — PREDNISONE (PAK) 10 MG PO TABS: ORAL_TABLET | ORAL | Status: DC

## 2012-11-23 MED ORDER — PANTOPRAZOLE SODIUM 40 MG PO TBEC: 40.0000 mg | DELAYED_RELEASE_TABLET | Freq: Every day | ORAL | Status: DC

## 2012-11-23 MED ORDER — FAMOTIDINE 20 MG PO TABS: ORAL_TABLET | ORAL | Status: DC

## 2012-11-23 MED ORDER — OLMESARTAN MEDOXOMIL-HCTZ 20-12.5 MG PO TABS: 1.0000 | ORAL_TABLET | Freq: Every day | ORAL | Status: DC

## 2012-11-23 NOTE — Patient Instructions (Signed)
Stop lisinopril and start benicar 20/12.5 one daily  Pantoprazole (protonix) 40 mg   Take 30-60 min before first meal of the day and Pepcid 20 mg one bedtime until return to office - this is the best way to tell whether stomach acid is contributing to your problem.    Prednisone 10 mg take  4 each am x 2 days,   2 each am x 2 days,  1 each am x 2 days and stop   Only use your albuterol (nebulizer) as a rescue medication to be used if you can't catch your breath by resting or doing a relaxed purse lip breathing pattern.  - The less you use it, the better it will work when you need it. - Ok to use up to every 4 hours if you must but call for immediate appointment if use goes up over your usual need - Don't leave home without it !!  (think of it like your spare tire for your car)   GERD (REFLUX)  is an extremely common cause of respiratory symptoms, many times with no significant heartburn at all.    It can be treated with medication, but also with lifestyle changes including avoidance of late meals, excessive alcohol, smoking cessation, and avoid fatty foods, chocolate, peppermint, colas, red wine, and acidic juices such as orange juice.  NO MINT OR MENTHOL PRODUCTS SO NO COUGH DROPS  USE SUGARLESS CANDY INSTEAD (jolley ranchers or Stover's)  NO OIL BASED VITAMINS - use powdered substitutes.    Please schedule a follow up office visit in 4 weeks, sooner if needed

## 2012-11-23 NOTE — Progress Notes (Signed)
Subjective:     Patient ID: Samuel [name], male   DOB: [birth year]   MRN: [id number]  HPI  42 yobm asthma as child grew out of it middle school then smoking in HS then recurred in his 74s with rare flares much worse  X 2013 referred by Dr Elisabeth Pigeon 11/23/2012 to pulmonary clinic p admit.  Admit date: 10/24/2012  Discharge date: 10/27/2012       Discharge Diagnoses:   COPD exacerbation   Hypoxemia  Acute upper respiratory infections of unspecified site  Tobacco use disorder  Morbid obesity  Edema leg  SOB (shortness of breath)  Discharge Condition: stable  Diet recommendation: regular  Filed Weights    10/24/12 2252   Weight:  156.2 kg (344 lb 5.7 oz)   History of present illness:  42 y.o. male with a history of Asthma/COPD who presents to the ED with complaints of Worsening SOB, and Wheezing -CTA done in ED neg for PE  Admitted with acute COPD exacerbation  Hospital Course:  1. Acute on chronic COPD exacerbation; CTA neg for PE: active smoker  -leukocytosis likely COPD+steroids; afebrile;improving  -clinically imporved on IV Steroid Taper, DuoNebs, O2, and Empiric Levaquin; d/c on tapering steroids; recommended to stop smoking; PFTs outpatient  2. Hypoxemia- Same as #1.  3. Edema of Right Leg> L chronic, Duplex Doppelr neg DVT. MRI ned,but some edema; -echo: LVEF 60%; diastolic dysfunction; clinically euvolemic; given lasix short term;  4. Morbid Obesity; recommended weight loss  5. Tobacco Use Disorder- Smoking Cessation Discussed    11/23/2012 1st  Pulmonary office visit/ [name] consultation requested by [name] Chief Complaint  Patient presents with  . Pulmonary Consult    Referred per Dr. [name]. Pt c/o dyspnea for approx 1 month- worse when exposed to strong smells and when he lies down. He also c/o occ dry cough.    worse since quit smoking = severe cough > mostly dry or clear, worse at hs, best with pred and neb Sob with [employer] more than an aisle nl  pace Started with acei 10/22 H/o HH  No obvious day to day or daytime variabilty or assoc excess or purulent sputum  or cp or chest tightness, subjective wheeze overt sinus or hb symptoms. No unusual exp hx or h/o childhood pna/ asthma or knowledge of premature birth.  Sleeping ok without nocturnal  or early am exacerbation  of respiratory  c/o's or need for noct saba. Also denies any obvious fluctuation of symptoms with weather or environmental changes or other aggravating or alleviating factors except as outlined above   Current Medications, Allergies, Complete Past Medical History, Past Surgical History, Family History, and Social History were reviewed in [name] record.  ROS  The following are not active complaints unless bolded sore throat, dysphagia, dental problems, itching, sneezing,  nasal congestion or excess/ purulent secretions, ear ache,   fever, chills, sweats, unintended wt loss, pleuritic or exertional cp, hemoptysis,  orthopnea pnd or leg swelling, presyncope, palpitations, heartburn, abdominal pain, anorexia, nausea, vomiting, diarrhea  or change in bowel or urinary habits, change in stools or urine, dysuria,hematuria,  rash, arthralgias, visual complaints, headache, numbness weakness or ataxia or problems with walking or coordination,  change in mood/affect or memory.       Review of Systems     Objective:   Physical Exam amb bm hoarse, gruff voice prominent pseudowheezing  Wt Readings from Last 3 Encounters:  11/23/12 344 lb (156.037 kg)  11/18/12 345  lb (156.491 kg)  10/24/12 344 lb 5.7 oz (156.2 kg)      HEENT: nl dentition, turbinates, and orophanx. Nl external ear canals without cough reflex   NECK :  without JVD/Nodes/TM/ nl carotid upstrokes bilaterally   LUNGS: no acc muscle use, tight upper and lower wheeze     CV:  RRR  no s3 or murmur or increase in P2, no edema   ABD:  soft and nontender with nl excursion in the supine  position. No bruits or organomegaly, bowel sounds nl  MS:  warm without deformities, calf tenderness, cyanosis or clubbing  SKIN: warm and dry without lesions    NEURO:  alert, approp, no deficits   Ct chest 10/24/12  No definite pulmonary emboli but slightly decreased sensitivity  secondary to respiratory motion artifact and body habitus.  Mild to moderate bibasilar atelectasis.  Cardiomegaly.       Assessment:

## 2012-11-24 ENCOUNTER — Ambulatory Visit: Payer: Self-pay | Attending: Internal Medicine

## 2012-11-24 NOTE — Assessment & Plan Note (Signed)
DDX of  difficult airways managment all start with A and  include Adherence, Ace Inhibitors, Acid Reflux, Active Sinus Disease, Alpha 1 Antitripsin deficiency, Anxiety masquerading as Airways dz,  ABPA,  allergy(esp in young), Aspiration (esp in elderly), Adverse effects of DPI,  Active smokers, plus two Bs  = Bronchiectasis and Beta blocker use..and one C= CHF  Adherence is always the initial "prime suspect" and is a multilayered concern that requires a "trust but verify" approach in every patient - starting with knowing how to use medications, especially inhalers, correctly, keeping up with refills and understanding the fundamental difference between maintenance and prns vs those medications only taken for a very short course and then stopped and not refilled.   ? Acid (or non-acid) GERD > always difficult to exclude as up to 75% of pts in some series report no assoc GI/ Heartburn symptoms> rec max (24h)  acid suppression and diet restrictions/ reviewed and instructions given in writting   Acei potentially augmenting the problem of the upper airway component at this point though added after the problem started > see HBP  ? Active smoking > emphasized importance of remaining off cigs indefinitely  ? chf > unlikely given recent inpt w/u.

## 2012-12-18 ENCOUNTER — Encounter: Payer: Self-pay | Admitting: Surgery

## 2012-12-21 ENCOUNTER — Ambulatory Visit (HOSPITAL_COMMUNITY)
Admission: RE | Admit: 2012-12-21 | Discharge: 2012-12-21 | Disposition: A | Discharge status: Home / Self Care | Payer: No Typology Code available for payment source | Source: Ambulatory Visit | Attending: Surgery | Admitting: Surgery

## 2012-12-21 ENCOUNTER — Ambulatory Visit (INDEPENDENT_AMBULATORY_CARE_PROVIDER_SITE_OTHER): Payer: Self-pay | Admitting: Surgery

## 2012-12-21 ENCOUNTER — Encounter: Payer: Self-pay | Admitting: Surgery

## 2012-12-21 VITALS — BP 150/86 | HR 102 | Ht 69.0 in | Wt 343.0 lb

## 2012-12-21 DIAGNOSIS — R609 Edema, unspecified: Secondary | ICD-10-CM | POA: Insufficient documentation

## 2012-12-21 NOTE — Progress Notes (Signed)
Patient name: Samuel [name] MRN: [id number] DOB: [birth year] Sex: male   Referred by: Dr Elisabeth Pigeon   Reason for referral:  Chief Complaint  Patient presents with  . New Evaluation    edema/ leg swelling    HISTORY OF PRESENT ILLNESS: This is a 42 year old gentleman who comes in today for evaluation of leg swelling, right greater than left.  He states his symptoms started approximately one year ago.  He has had a intermittently healing ulcer for approximately 8 months.  He does report a history of trauma when dropping something at [employer] several years ago.  Otherwise, his medical history is unremarkable except for asthma which was exacerbated by tobacco abuse which he has now discontinued.  He denies any fevers or chills.  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  Past Medical History  Diagnosis Date  . Asthma     History reviewed. No pertinent past surgical history.  History   Social History  . Marital Status: Single    Spouse Name: N/A    Number of Children: 4  . Years of Education: N/A   Occupational History  . Handyman    Social History Main Topics  . Smoking status: Former Smoker -- 1.00 packs/day for 15 years    Types: Cigarettes    Quit date: 10/28/2012  . Smokeless tobacco: Never Used  . Alcohol Use: 2.4 oz/week    2 Cans of beer, 2 Shots of liquor per week  . Drug Use: No  . Sexual Activity: Not on file   Other Topics Concern  . Not on file   Social History Narrative  . No narrative on file    Family History  Problem Relation Age of Onset  . Asthma Mother   . Hypertension Mother     Allergies as of 12/21/2012  . (No Known Allergies)    Current Outpatient Prescriptions on File Prior to Visit  Medication Sig Dispense Refill  . albuterol (PROVENTIL HFA;VENTOLIN HFA) 108 (90 BASE) MCG/ACT inhaler Inhale 2 puffs into the lungs every 6 (six) hours as needed for wheezing or shortness of breath.  1 Inhaler  12  .  albuterol (PROVENTIL) (5 MG/ML) 0.5% nebulizer solution Take 0.5 mLs (2.5 mg total) by nebulization every 2 (two) hours as needed for wheezing or shortness of breath.  20 mL  12  . aspirin EC 325 MG tablet Take 325 mg by mouth daily as needed for pain.      . chlorpheniramine-HYDROcodone (TUSSIONEX) 10-8 MG/5ML LQCR Take 5 mLs by mouth every 12 (twelve) hours as needed.  115 mL  0  . famotidine (PEPCID) 20 MG tablet One at bedtime  30 tablet  2  . hydrocortisone cream 1 % Apply 1 application topically daily as needed (rash).      Marland Kitchen ipratropium (ATROVENT) 0.02 % nebulizer solution Take 2.5 mLs (0.5 mg total) by nebulization every 4 (four) hours.  75 mL  12  . neomycin-bacitracin-polymyxin (NEOSPORIN) OINT Apply 1 application topically 2 (two) times daily as needed (wound care).      Marland Kitchen olmesartan-hydrochlorothiazide (BENICAR HCT) 20-12.5 MG per tablet Take 1 tablet by mouth daily.      . pantoprazole (PROTONIX) 40 MG tablet Take 1 tablet (40 mg total) by mouth daily. Take 30-60 min before first meal of the day  30 tablet  2  . predniSONE (STERAPRED UNI-PAK) 10 MG tablet Prednisone 10 mg take  4 each am x  2 days,   2 each am x 2 days,  1 each am x2days and stop  14 tablet  0   No current facility-administered medications on file prior to visit.     REVIEW OF SYSTEMS: Cardiovascular: Positive for leg swelling Pulmonary: Positive for asthma Neurologic: No weakness, paresthesias, aphasia, or amaurosis. No dizziness. Hematologic: No bleeding problems or clotting disorders. Musculoskeletal: No joint pain or joint swelling. Gastrointestinal: No blood in stool or hematemesis Genitourinary: No dysuria or hematuria. Psychiatric:: No history of major depression. Integumentary: No rashes or ulcers. Constitutional: No fever or chills.  PHYSICAL EXAMINATION: General: The patient appears their stated age.  Vital signs are BP 150/86  Pulse 102  Ht 5\' 9"  (1.753 m)  Wt 343 lb (155.584 kg)  BMI 50.63  kg/m2  SpO2 95% HEENT:  No gross abnormalities Pulmonary: Respirations are non-labored Abdomen: Obese, no masses Musculoskeletal: There are no major deformities.   Neurologic: No focal weakness or paresthesias are detected, Skin: There are no ulcer or rashes noted. Psychiatric: The patient has normal affect. Cardiovascular: There is a regular rate and rhythm without significant murmur appreciated. Palpable pedal pulses.  2+ edema to the right leg with significant skin discoloration and a chronic-appearing 2 cm ulcer which has scabbed over.  No evidence of infection  Diagnostic Studies: Venous reflux workup was performed today.  There is no evidence of reflux or obstruction in the venous system bilaterally  Assessment:  Right leg swelling with ulceration Plan: The patient has palpable pulses in his right foot as well as a normal venous reflux evaluation.  Therefore, I suspect this is all secondary to lymphedema.  I have recommended placing him in 20-30 mm compression stockings.  Because he still has a ulcer present, I am going to place him in a due to due to see if I can get this to completely heal and then focus on compression stockings for ulcer prevention.  He'll followup in 3 weeks.  I will get home health to do Unna boot changes 3 times per week     V. Charlena Cross, M.D. [facility] Office: [phone] Pager:  [phone]

## 2012-12-22 ENCOUNTER — Ambulatory Visit (INDEPENDENT_AMBULATORY_CARE_PROVIDER_SITE_OTHER): Payer: No Typology Code available for payment source | Admitting: Internal Medicine

## 2012-12-22 ENCOUNTER — Encounter: Payer: Self-pay | Admitting: Internal Medicine

## 2012-12-22 VITALS — BP 118/80 | HR 100 | Temp 97.8°F | Ht 69.0 in | Wt 347.0 lb

## 2012-12-22 DIAGNOSIS — J449 Chronic obstructive pulmonary disease, unspecified: Secondary | ICD-10-CM

## 2012-12-22 NOTE — Progress Notes (Signed)
Subjective:     Patient ID: Samuel [name], male   DOB: 1970/05/11   MRN: [id number]  HPI  42 yobm asthma as child grew out of it middle school then smoking in HS then recurred in his 55s with rare flares much worse  X 2013 referred by Dr Samuel [name] 11/23/2012 to pulmonary clinic p admit.  Admit date: 10/24/2012  Discharge date: 10/27/2012       Discharge Diagnoses:   COPD exacerbation   Hypoxemia  Acute upper respiratory infections of unspecified site  Tobacco use disorder  Morbid obesity  Edema leg  SOB (shortness of breath)  Discharge Condition: stable  Diet recommendation: regular  Filed Weights    10/24/12 2252   Weight:  156.2 kg (344 lb 5.7 oz)   History of present illness:  42 y.o. male with a history of Asthma/COPD who presents to the ED with complaints of Worsening SOB, and Wheezing -CTA done in ED neg for PE  Admitted with acute COPD exacerbation  Hospital Course:  1. Acute on chronic COPD exacerbation; CTA neg for PE: active smoker  -leukocytosis likely COPD+steroids; afebrile;improving  -clinically imporved on IV Steroid Taper, DuoNebs, O2, and Empiric Levaquin; d/c on tapering steroids; recommended to stop smoking; PFTs outpatient  2. Hypoxemia- Same as #1.  3. Edema of Right Leg> L chronic, Duplex Doppelr neg DVT. MRI ned,but some edema; -echo: LVEF 60%; diastolic dysfunction; clinically euvolemic; given lasix short term;  4. Morbid Obesity; recommended weight loss  5. Tobacco Use Disorder- Smoking Cessation Discussed    11/23/2012 1st [facility] office visit/ Samuel [name] consultation requested by Samuel [name] Chief Complaint  Patient presents with  . Pulmonary Consult    Referred per Dr. Westley [name]. Pt c/o dyspnea for approx 1 month- worse when exposed to strong smells and when he lies down. He also c/o occ dry cough.    worse since quit smoking = severe cough > mostly dry or clear, worse at hs, best with pred and neb Sob with [employer] more than an aisle nl  pace Started with acei 10/22 H/o HH rec Stop lisinopril and start benicar 20/12.5 one daily Pantoprazole (protonix) 40 mg   Take 30-60 min before first meal of the day and Pepcid 20 mg one bedtime until return to office - this is the best way to tell whether stomach acid is contributing to your problem.   Prednisone 10 mg take  4 each am x 2 days,   2 each am x 2 days,  1 each am x 2 days and stop  Only use your albuterol (nebulizer) as a rescue medication   GERD diet     12/22/2012 f/u ov/Samuel [name] re: sob/ no cough  Chief Complaint  Patient presents with  . Follow-up    Pt reports breathing is much improved. Has only used rescue inhaler once in the past wk.    no longer limited by breathing Only time need saba was after perfume exp x one   No obvious day to day or daytime variabilty or assoc excess or purulent sputum  or cp or chest tightness, subjective wheeze overt sinus or hb symptoms. No unusual exp hx or h/o childhood pna/ asthma or knowledge of premature birth.  Sleeping ok without nocturnal  or early am exacerbation  of respiratory  c/o's or need for noct saba. Also denies any obvious fluctuation of symptoms with weather or environmental changes or other aggravating or alleviating factors except as outlined above   Current Medications,  Allergies, Complete Past Medical History, Past Surgical History, Family History, and Social History were reviewed in [name] record.  ROS  The following are not active complaints unless bolded sore throat, dysphagia, dental problems, itching, sneezing,  nasal congestion or excess/ purulent secretions, ear ache,   fever, chills, sweats, unintended wt loss, pleuritic or exertional cp, hemoptysis,  orthopnea pnd or leg swelling, presyncope, palpitations, heartburn, abdominal pain, anorexia, nausea, vomiting, diarrhea  or change in bowel or urinary habits, change in stools or urine, dysuria,hematuria,  rash, arthralgias, visual  complaints, headache, numbness weakness or ataxia or problems with walking or coordination,  change in mood/affect or memory.            Objective:   Physical Exam  amb bm    Wt Readings from Last 3 Encounters:  12/22/12 347 lb (157.398 kg)  12/21/12 343 lb (155.584 kg)  11/23/12 344 lb (156.037 kg)         HEENT: nl dentition, turbinates, and orophanx. Nl external ear canals without cough reflex   NECK :  without JVD/Nodes/TM/ nl carotid upstrokes bilaterally   LUNGS: no acc muscle use, completely clear bilaterally    CV:  RRR  no s3 or murmur or increase in P2, no edema   ABD:  soft and nontender with nl excursion in the supine position. No bruits or organomegaly, bowel sounds nl  MS:  warm without deformities, calf tenderness, cyanosis or clubbing  SKIN: warm and dry without lesions    NEURO:  alert, approp, no deficits   Ct chest 10/24/12  No definite pulmonary emboli but slightly decreased sensitivity  secondary to respiratory motion artifact and body habitus.  Mild to moderate bibasilar atelectasis.  Cardiomegaly.       Assessment:

## 2012-12-22 NOTE — Patient Instructions (Addendum)
Continue the acid suppression until after the holidays then ok to try off and see if breathing problems recur, if so resume  See your primary care providers for ongoing care of your hypertension but you can't take ace inhibitors at this point   If you are satisfied with your treatment plan let your doctor know and he/she can either refill your medications or you can return here when your prescription runs out.     If in any way you are not 100% satisfied,  please tell us.  If 100% better, tell your friends!

## 2012-12-22 NOTE — Assessment & Plan Note (Signed)
-   trial off acei  And on gerd rx started 11/24/2012  > all symptoms resolved 12/22/2012  - Spirometry 12/22/2012  FEV1  2.04 (59%) ratio 67   I reviewed the Flethcher curve with patient that basically indicates  if you quit smoking when your best day FEV1 is still well preserved it is highly unlikely you will progress to severe disease and informed the patient there was no medication on the market that has proven to change the curve or the likelihood of progression.  Therefore stopping smoking and maintaining abstinence is the most important aspect of care, not choice of inhalers or for that matter, doctors.    Only issue now is whether needs longterm aggressive gerd rx > try off p holidays (reflux and acei are synergistic at destablilizing the upper airway so without the acei on board the gerd may not result in any resp symptoms)

## 2012-12-23 ENCOUNTER — Ambulatory Visit (INDEPENDENT_AMBULATORY_CARE_PROVIDER_SITE_OTHER): Payer: No Typology Code available for payment source

## 2012-12-23 DIAGNOSIS — R609 Edema, unspecified: Secondary | ICD-10-CM

## 2012-12-23 NOTE — Progress Notes (Signed)
Mr. Holtsclaw was seen today for a unna boot reapplication to his Rt leg per Dr. Myra Gianotti. The previous unna boot was removed and his leg was cleaned with soap and warm water. The new unna boot was reapplied without difficulty. He will return on Monday 12/28/12 for reapplication. Request for Home health to do unna boot changes 3X per week was denied so he will continue to follow up with our office. Chanda Maness-Harrsion CMA, AAMA

## 2012-12-28 ENCOUNTER — Ambulatory Visit (INDEPENDENT_AMBULATORY_CARE_PROVIDER_SITE_OTHER): Payer: No Typology Code available for payment source

## 2012-12-28 DIAGNOSIS — L97919 Non-pressure chronic ulcer of unspecified part of right lower leg with unspecified severity: Secondary | ICD-10-CM

## 2012-12-28 DIAGNOSIS — R609 Edema, unspecified: Secondary | ICD-10-CM

## 2012-12-28 DIAGNOSIS — L97909 Non-pressure chronic ulcer of unspecified part of unspecified lower leg with unspecified severity: Secondary | ICD-10-CM | POA: Insufficient documentation

## 2012-12-28 NOTE — Progress Notes (Signed)
Samuel [name] was seen today for a unna boot change to his R LE per Dr. Myra Gianotti. He removed his unna boot prior to his visit today. A new unna boot was applied without difficulty. He will return on Wednesday for reapplication. Chanda Maness-Harrison CMA. AAMA

## 2012-12-30 ENCOUNTER — Ambulatory Visit (INDEPENDENT_AMBULATORY_CARE_PROVIDER_SITE_OTHER): Payer: No Typology Code available for payment source

## 2012-12-30 ENCOUNTER — Other Ambulatory Visit: Payer: Self-pay

## 2012-12-30 DIAGNOSIS — L97909 Non-pressure chronic ulcer of unspecified part of unspecified lower leg with unspecified severity: Secondary | ICD-10-CM

## 2012-12-30 DIAGNOSIS — R609 Edema, unspecified: Secondary | ICD-10-CM

## 2012-12-30 DIAGNOSIS — I890 Lymphedema, not elsewhere classified: Secondary | ICD-10-CM

## 2012-12-30 DIAGNOSIS — I739 Peripheral vascular disease, unspecified: Secondary | ICD-10-CM

## 2012-12-30 NOTE — Progress Notes (Signed)
Mr. Willmon was seen today for an unna boot change to his right LE per Dr. Myra Gianotti. He removed the unna boot prior to his visit today. A new unna boot was applied today without difficulty. He will return on Friday for reapplication. [address] [place], CMA, [place]

## 2013-01-01 ENCOUNTER — Encounter (INDEPENDENT_AMBULATORY_CARE_PROVIDER_SITE_OTHER): Payer: No Typology Code available for payment source

## 2013-01-01 DIAGNOSIS — L97909 Non-pressure chronic ulcer of unspecified part of unspecified lower leg with unspecified severity: Secondary | ICD-10-CM

## 2013-01-01 DIAGNOSIS — R609 Edema, unspecified: Secondary | ICD-10-CM

## 2013-01-04 ENCOUNTER — Ambulatory Visit (INDEPENDENT_AMBULATORY_CARE_PROVIDER_SITE_OTHER): Payer: No Typology Code available for payment source

## 2013-01-04 DIAGNOSIS — I890 Lymphedema, not elsewhere classified: Secondary | ICD-10-CM

## 2013-01-04 DIAGNOSIS — R609 Edema, unspecified: Secondary | ICD-10-CM

## 2013-01-04 NOTE — Progress Notes (Signed)
Samuel [name] was seen today for a unna boot change to his R LE. He removed the old unna boot prior to his visit today. The area on his R leg is completely healed. A new unna boot was reapplied without difficulty. He will return on Wednesday for reapplication. Gurfateh Mcclain Maness-Harrison CMA, AAMA

## 2013-01-06 ENCOUNTER — Encounter (INDEPENDENT_AMBULATORY_CARE_PROVIDER_SITE_OTHER): Payer: No Typology Code available for payment source

## 2013-01-06 DIAGNOSIS — R609 Edema, unspecified: Secondary | ICD-10-CM

## 2013-01-08 ENCOUNTER — Encounter: Payer: Self-pay | Admitting: Surgery

## 2013-01-08 ENCOUNTER — Encounter (INDEPENDENT_AMBULATORY_CARE_PROVIDER_SITE_OTHER): Payer: No Typology Code available for payment source

## 2013-01-08 DIAGNOSIS — R609 Edema, unspecified: Secondary | ICD-10-CM

## 2013-01-11 ENCOUNTER — Encounter: Payer: Self-pay | Admitting: Surgery

## 2013-01-11 ENCOUNTER — Ambulatory Visit (INDEPENDENT_AMBULATORY_CARE_PROVIDER_SITE_OTHER): Payer: No Typology Code available for payment source | Admitting: Surgery

## 2013-01-11 VITALS — BP 137/70 | HR 102 | Temp 98.6°F | Resp 18 | Ht 69.0 in | Wt 347.0 lb

## 2013-01-11 DIAGNOSIS — R609 Edema, unspecified: Secondary | ICD-10-CM

## 2013-01-11 DIAGNOSIS — L97919 Non-pressure chronic ulcer of unspecified part of right lower leg with unspecified severity: Secondary | ICD-10-CM

## 2013-01-11 DIAGNOSIS — L97909 Non-pressure chronic ulcer of unspecified part of unspecified lower leg with unspecified severity: Secondary | ICD-10-CM

## 2013-01-11 NOTE — Progress Notes (Signed)
Patient name: Samuel [name] MRN: [id number] DOB: [birth year] Sex: male     Chief Complaint  Patient presents with  . Venous Insufficiency    Follow up after unna boot therapy x 1 month    HISTORY OF PRESENT ILLNESS: The patient is back today for followup.  He initially presented with right leg swelling and an ulcer which had intermittently healed over the course of the past 8 months.  He did have a history of dropping something at [employer] several years ago.  I placed him in a Radio broadcast assistant.  This was being changed 3 times a week by home health.  The patient reports that his ulcer has now completely healed.  He has not yet bought compression stockings.  The patient did undergo arterial and venous Dopplers.  His arterial and venous Dopplers were both normal.  Past Medical History  Diagnosis Date  . Asthma   . Ulcer     venous insufficiency    History reviewed. No pertinent past surgical history.  History   Social History  . Marital Status: Single    Spouse Name: N/A    Number of Children: 4  . Years of Education: N/A   Occupational History  . Handyman    Social History Main Topics  . Smoking status: Former Smoker -- 1.00 packs/day for 15 years    Types: Cigarettes    Quit date: 10/28/2012  . Smokeless tobacco: Never Used  . Alcohol Use: 2.4 oz/week    2 Cans of beer, 2 Shots of liquor per week  . Drug Use: No  . Sexual Activity: Not on file   Other Topics Concern  . Not on file   Social History Narrative  . No narrative on file    Family History  Problem Relation Age of Onset  . Asthma Mother   . Hypertension Mother     Allergies as of 01/11/2013  . (No Known Allergies)    Current Outpatient Prescriptions on File Prior to Visit  Medication Sig Dispense Refill  . albuterol (PROVENTIL HFA;VENTOLIN HFA) 108 (90 BASE) MCG/ACT inhaler Inhale 2 puffs into the lungs every 6 (six) hours as needed for wheezing or shortness of breath.  1 Inhaler  12  . aspirin EC  325 MG tablet Take 325 mg by mouth daily as needed for pain.      . famotidine (PEPCID) 20 MG tablet One at bedtime  30 tablet  2  . hydrocortisone cream 1 % Apply 1 application topically daily as needed (rash).      . neomycin-bacitracin-polymyxin (NEOSPORIN) OINT Apply 1 application topically 2 (two) times daily as needed (wound care).      Marland Kitchen olmesartan-hydrochlorothiazide (BENICAR HCT) 20-12.5 MG per tablet Take 1 tablet by mouth daily.      . pantoprazole (PROTONIX) 40 MG tablet Take 1 tablet (40 mg total) by mouth daily. Take 30-60 min before first meal of the day  30 tablet  2   No current facility-administered medications on file prior to visit.     REVIEW OF SYSTEMS: No changes from prior visit with the exception of his ulcer having now he'll  PHYSICAL EXAMINATION:   Vital signs are BP 137/70  Pulse 102  Temp(Src) 98.6 F (37 C) (Oral)  Resp 18  Ht 5\' 9"  (1.753 m)  Wt 347 lb (157.398 kg)  BMI 51.22 kg/m2  SpO2 98% General: The patient appears their stated age. HEENT:  No gross abnormalities Pulmonary:  Non  labored breathing Musculoskeletal: There are no major deformities. Neurologic: No focal weakness or paresthesias are detected, Skin: Right medial ankle ulcer has completely healed  Psychiatric: The patient has normal affect. Cardiovascular: Palpable pulses   Diagnostic Studies None  Assessment: Right lower extremity ulcer secondary to lymphedema Plan: The patient's ulcer has healed with Unna boot compression.  I stressed the importance of wearing compression stockings, 20-30 mm knee-high compression.  The patient was again given directions to [employer].  He tells me he will get a stockings tomorrow.  He'll follow up with me on an as-needed basis.  Jorge Ny, M.D. [facility] Office: [phone] Pager:  [phone]

## 2013-02-02 DIAGNOSIS — I890 Lymphedema, not elsewhere classified: Secondary | ICD-10-CM | POA: Insufficient documentation

## 2013-05-07 ENCOUNTER — Ambulatory Visit: Payer: Self-pay

## 2013-05-20 ENCOUNTER — Encounter: Payer: Self-pay | Admitting: Internal Medicine

## 2013-05-20 ENCOUNTER — Ambulatory Visit: Payer: Self-pay

## 2013-05-20 ENCOUNTER — Ambulatory Visit: Payer: Self-pay | Attending: Internal Medicine | Admitting: Internal Medicine

## 2013-05-20 VITALS — BP 141/87 | HR 101 | Temp 98.5°F | Resp 14 | Ht 69.5 in | Wt 347.0 lb

## 2013-05-20 DIAGNOSIS — G4733 Obstructive sleep apnea (adult) (pediatric): Secondary | ICD-10-CM | POA: Insufficient documentation

## 2013-05-20 DIAGNOSIS — J45901 Unspecified asthma with (acute) exacerbation: Secondary | ICD-10-CM

## 2013-05-20 DIAGNOSIS — J441 Chronic obstructive pulmonary disease with (acute) exacerbation: Secondary | ICD-10-CM | POA: Insufficient documentation

## 2013-05-20 DIAGNOSIS — Z09 Encounter for follow-up examination after completed treatment for conditions other than malignant neoplasm: Secondary | ICD-10-CM | POA: Insufficient documentation

## 2013-05-20 DIAGNOSIS — I10 Essential (primary) hypertension: Secondary | ICD-10-CM | POA: Insufficient documentation

## 2013-05-20 LAB — CBC WITH DIFFERENTIAL/PLATELET
BASOS ABS: 0 10*3/uL (ref 0.0–0.1)
BASOS PCT: 0 % (ref 0–1)
Eosinophils Absolute: 0.3 10*3/uL (ref 0.0–0.7)
Eosinophils Relative: 4 % (ref 0–5)
HCT: 40.7 % (ref 39.0–52.0)
HEMOGLOBIN: 14.1 g/dL (ref 13.0–17.0)
Lymphocytes Relative: 26 % (ref 12–46)
Lymphs Abs: 2.1 10*3/uL (ref 0.7–4.0)
MCH: 28 pg (ref 26.0–34.0)
MCHC: 34.6 g/dL (ref 30.0–36.0)
MCV: 80.9 fL (ref 78.0–100.0)
Monocytes Absolute: 0.8 10*3/uL (ref 0.1–1.0)
Monocytes Relative: 10 % (ref 3–12)
NEUTROS ABS: 4.7 10*3/uL (ref 1.7–7.7)
Neutrophils Relative %: 60 % (ref 43–77)
Platelets: 351 10*3/uL (ref 150–400)
RBC: 5.03 MIL/uL (ref 4.22–5.81)
RDW: 14.3 % (ref 11.5–15.5)
WBC: 7.9 10*3/uL (ref 4.0–10.5)

## 2013-05-20 LAB — TSH: TSH: 0.392 u[IU]/mL (ref 0.350–4.500)

## 2013-05-20 LAB — COMPLETE METABOLIC PANEL WITH GFR
ALBUMIN: 4.1 g/dL (ref 3.5–5.2)
ALK PHOS: 61 U/L (ref 39–117)
ALT: 26 U/L (ref 0–53)
AST: 19 U/L (ref 0–37)
BUN: 9 mg/dL (ref 6–23)
CO2: 26 mEq/L (ref 19–32)
Calcium: 9 mg/dL (ref 8.4–10.5)
Chloride: 103 mEq/L (ref 96–112)
Creat: 0.91 mg/dL (ref 0.50–1.35)
GFR, Est African American: >89 mL/min
GFR, Est Non African American: >89 mL/min
GLUCOSE: 88 mg/dL (ref 70–99)
POTASSIUM: 4.1 meq/L (ref 3.5–5.3)
Sodium: 137 mEq/L (ref 135–145)
TOTAL PROTEIN: 6.9 g/dL (ref 6.0–8.3)
Total Bilirubin: 0.5 mg/dL (ref 0.2–1.2)

## 2013-05-20 LAB — LIPID PANEL
CHOL/HDL RATIO: 4.4 ratio
Cholesterol: 163 mg/dL (ref 0–200)
HDL: 37 mg/dL — AB (ref >39)
LDL Cholesterol: 108 mg/dL — ABNORMAL HIGH (ref 0–99)
Triglycerides: 89 mg/dL (ref <150)
VLDL: 18 mg/dL (ref 0–40)

## 2013-05-20 LAB — POCT GLYCOSYLATED HEMOGLOBIN (HGB A1C): Hemoglobin A1C: 5.5

## 2013-05-20 MED ORDER — PREDNISONE 20 MG PO TABS
20.0000 mg | ORAL_TABLET | Freq: Every day | ORAL | Status: DC
Start: 2013-05-20 — End: 2013-06-20

## 2013-05-20 MED ORDER — OLMESARTAN MEDOXOMIL-HCTZ 20-12.5 MG PO TABS: 1.0000 | ORAL_TABLET | Freq: Every day | ORAL | Status: DC

## 2013-05-20 MED ORDER — PANTOPRAZOLE SODIUM 40 MG PO TBEC: 40.0000 mg | DELAYED_RELEASE_TABLET | Freq: Every day | ORAL | Status: DC

## 2013-05-20 MED ORDER — FLUTICASONE-SALMETEROL 100-50 MCG/DOSE IN AEPB: 1.0000 | INHALATION_SPRAY | Freq: Two times a day (BID) | RESPIRATORY_TRACT | Status: DC

## 2013-05-20 MED ORDER — ALBUTEROL SULFATE HFA 108 (90 BASE) MCG/ACT IN AERS: 2.0000 | INHALATION_SPRAY | Freq: Four times a day (QID) | RESPIRATORY_TRACT | Status: DC | PRN

## 2013-05-20 NOTE — Progress Notes (Signed)
Patient ID: Samuel [name], male   DOB: [birth year], 43 y.o.   MRN: [id number]   Samuel [name], is a 43 y.o. male  [id number]  [id number]  DOB - [birth year]  Chief Complaint  Patient presents with  . Follow-up        Subjective:   Samuel [name] is a 43 y.o. male here today for a follow up. Today he presents with an asthma/COPD exacerbation. He is having a dry non-productive cough, shortness of breath and wheezing that began approximately one month ago. No aggravating or relieving factors. He is also complaining of falling asleep while driving and talking on the phone. He states he snores loudly. His past medical history is positive for asthma, venous insufficiency, and an ulcer on his right lower extremity. Patient has No headache, No chest pain, No abdominal pain - No Nausea, No new weakness tingling or numbness, No Cough - SOB.  Problem  Copd Exacerbation  Osa (Obstructive Sleep Apnea)    ALLERGIES: No Known Allergies  PAST MEDICAL HISTORY: Past Medical History  Diagnosis Date  . Asthma   . Ulcer     venous insufficiency    MEDICATIONS AT HOME: Prior to Admission medications   Medication Sig Start Date End Date Taking? Authorizing Provider  albuterol (PROVENTIL HFA;VENTOLIN HFA) 108 (90 BASE) MCG/ACT inhaler Inhale 2 puffs into the lungs every 6 (six) hours as needed for wheezing or shortness of breath. 05/20/13  Yes Jeanann Lewandowskylugbemiga Jerni Selmer, MD  aspirin EC 325 MG tablet Take 325 mg by mouth daily as needed for pain.   Yes Historical Provider, MD  neomycin-bacitracin-polymyxin (NEOSPORIN) OINT Apply 1 application topically 2 (two) times daily as needed (wound care).   Yes Historical Provider, MD  pantoprazole (PROTONIX) 40 MG tablet Take 1 tablet (40 mg total) by mouth daily. Take 30-60 min before first meal of the day 05/20/13  Yes Jeanann Lewandowskylugbemiga Antone Summons, MD  famotidine (PEPCID) 20 MG tablet One at bedtime 11/23/12   Nyoka CowdenMichael B Wert, MD  Fluticasone-Salmeterol (ADVAIR)  100-50 MCG/DOSE AEPB Inhale 1 puff into the lungs 2 (two) times daily. 05/20/13   Jeanann Lewandowskylugbemiga Josie Mesa, MD  hydrocortisone cream 1 % Apply 1 application topically daily as needed (rash).    Historical Provider, MD  olmesartan-hydrochlorothiazide (BENICAR HCT) 20-12.5 MG per tablet Take 1 tablet by mouth daily. 05/20/13   Jeanann Lewandowskylugbemiga Kharis Lapenna, MD     Objective:   Filed Vitals:   05/20/13 1019  BP: 141/87  Pulse: 101  Temp: 98.5 F (36.9 C)  TempSrc: Oral  Resp: 14  Height: 5' 9.5" (1.765 m)  Weight: 347 lb (157.398 kg)  SpO2: 93%    Exam General appearance : Awake, alert, not in any distress. Speech Clear. Not toxic looking HEENT: Atraumatic and Normocephalic, pupils equally reactive to light and accomodation Neck: supple, no JVD. No cervical lymphadenopathy.  Chest:Good air entry bilaterally, Wheezing throughout all lung fields.  CVS: S1 S2 regular, no murmurs.  Abdomen: Bowel sounds present, Non tender and not distended with no gaurding, rigidity or rebound. Extremities: B/L Lower Ext shows no edema, both legs are warm to touch Neurology: Awake alert, and oriented X 3, CN II-XII intact, Non focal Skin:No Rash Wounds:N/A  Data Review No results found for this basename: HGBA1C     Assessment & Plan   1. COPD exacerbation  - Fluticasone-Salmeterol (ADVAIR) 100-50 MCG/DOSE AEPB; Inhale 1 puff into the lungs 2 (two) times daily.  Dispense: 1 each; Refill: 3 - albuterol (PROVENTIL HFA;VENTOLIN HFA) 108 (90 BASE) MCG/ACT  inhaler; Inhale 2 puffs into the lungs every 6 (six) hours as needed for wheezing or shortness of breath.  Dispense: 3 Inhaler; Refill: 3  2. Morbid obesity  - Split night study; Future - POCT glycosylated hemoglobin (Hb A1C) - Lipid panel - TSH - Urinalysis, Complete - pantoprazole (PROTONIX) 40 MG tablet; Take 1 tablet (40 mg total) by mouth daily. Take 30-60 min before first meal of the day  Dispense: 90 tablet; Refill: 3  3. HTN (hypertension)  - CBC  with Differential - COMPLETE METABOLIC PANEL WITH GFR Refill - olmesartan-hydrochlorothiazide (BENICAR HCT) 20-12.5 MG per tablet; Take 1 tablet by mouth daily.  Dispense: 90 tablet; Refill: 3  4. OSA (obstructive sleep apnea)  - Split night study; Future  Patient was counseled extensively on nutrition and exercise  Return in about 3 months (around 08/19/2013), or if symptoms worsen or fail to improve, for Follow up HTN, COPD.  The patient was given clear instructions to go to ER or return to medical center if symptoms don't improve, worsen or new problems develop. The patient verbalized understanding. The patient was told to call to get lab results if they haven't heard anything in the next week.   This note has been created with Education officer, environmentalDragon speech recognition software and smart phrase technology. Any transcriptional errors are unintentional.    Jeanann Lewandowskylugbemiga Rolena Knutson, MD, MHA, FACP, [facility] [place], [place] [phone]   05/20/2013, 11:19 AM

## 2013-05-20 NOTE — Progress Notes (Signed)
Pt is here following up on his asthma. Pt states that he is frequently falling asleep during the day. Pt reports that his chest is swelling.

## 2013-05-20 NOTE — Patient Instructions (Signed)
Asthma, Adult Asthma is a recurring condition in which the airways tighten and narrow. Asthma can make it difficult to breathe. It can cause coughing, wheezing, and shortness of breath. Asthma episodes (also called asthma attacks) range from minor to life-threatening. Asthma cannot be cured, but medicines and lifestyle changes can help control it. CAUSES Asthma is believed to be caused by inherited (genetic) and environmental factors, but its exact cause is unknown. Asthma may be triggered by allergens, lung infections, or irritants in the air. Asthma triggers are different for each person. Common triggers include:   Animal dander.  Dust mites.  Cockroaches.  Pollen from trees or grass.  Mold.  Smoke.  Air pollutants such as dust, household cleaners, hair sprays, aerosol sprays, paint fumes, strong chemicals, or strong odors.  Cold air, weather changes, and winds (which increase molds and pollens in the air).  Strong emotional expressions such as crying or laughing hard.  Stress.  Certain medicines (such as aspirin) or types of drugs (such as beta-blockers).  Sulfites in foods and drinks. Foods and drinks that may contain sulfites include dried fruit, potato chips, and sparkling grape juice.  Infections or inflammatory conditions such as the flu, a cold, or an inflammation of the nasal membranes (rhinitis).  Gastroesophageal reflux disease (GERD).  Exercise or strenuous activity. SYMPTOMS Symptoms may occur immediately after asthma is triggered or many hours later. Symptoms include:  Wheezing.  Excessive nighttime or early morning coughing.  Frequent or severe coughing with a common cold.  Chest tightness.  Shortness of breath. DIAGNOSIS  The diagnosis of asthma is made by a review of your medical history and a physical exam. Tests may also be performed. These may include:  Lung function studies. These tests show how much air you breath in and out.  Allergy  tests.  Imaging tests such as X-rays. TREATMENT  Asthma cannot be cured, but it can usually be controlled. Treatment involves identifying and avoiding your asthma triggers. It also involves medicines. There are 2 classes of medicine used for asthma treatment:   Controller medicines. These prevent asthma symptoms from occurring. They are usually taken every day.  Reliever or rescue medicines. These quickly relieve asthma symptoms. They are used as needed and provide short-term relief. Your health care provider will help you create an asthma action plan. An asthma action plan is a written plan for managing and treating your asthma attacks. It includes a list of your asthma triggers and how they may be avoided. It also includes information on when medicines should be taken and when their dosage should be changed. An action plan may also involve the use of a device called a peak flow meter. A peak flow meter measures how well the lungs are working. It helps you monitor your condition. HOME CARE INSTRUCTIONS   Take medicine as directed by your health care provider. Speak with your health care provider if you have questions about how or when to take the medicines.  Use a peak flow meter as directed by your health care provider. Record and keep track of readings.  Understand and use the action plan to help minimize or stop an asthma attack without needing to seek medical care.  Control your home environment in the following ways to help prevent asthma attacks:  Do not smoke. Avoid being exposed to secondhand smoke.  Change your heating and air conditioning filter regularly.  Limit your use of fireplaces and wood stoves.  Get rid of pests (such as roaches and   mice) and their droppings.  Throw away plants if you see mold on them.  Clean your floors and dust regularly. Use unscented cleaning products.  Try to have someone else vacuum for you regularly. Stay out of rooms while they are being  vacuumed and for a short while afterward. If you vacuum, use a dust mask from a hardware store, a double-layered or microfilter vacuum cleaner bag, or a vacuum cleaner with a HEPA filter.  Replace carpet with wood, tile, or vinyl flooring. Carpet can trap dander and dust.  Use allergy-proof pillows, mattress covers, and box spring covers.  Wash bed sheets and blankets every week in hot water and dry them in a dryer.  Use blankets that are made of polyester or cotton.  Clean bathrooms and kitchens with bleach. If possible, have someone repaint the walls in these rooms with mold-resistant paint. Keep out of the rooms that are being cleaned and painted.  Wash hands frequently. SEEK MEDICAL CARE IF:   You have wheezing, shortness of breath, or a cough even if taking medicine to prevent attacks.  The colored mucus you cough up (sputum) is thicker than usual.  Your sputum changes from clear or white to yellow, green, gray, or bloody.  You have any problems that may be related to the medicines you are taking (such as a rash, itching, swelling, or trouble breathing).  You are using a reliever medicine more than 2 3 times per week.  Your peak flow is still at 50 79% of you personal best after following your action plan for 1 hour. SEEK IMMEDIATE MEDICAL CARE IF:   You seem to be getting worse and are unresponsive to treatment during an asthma attack.  You are short of breath even at rest.  You get short of breath when doing very little physical activity.  You have difficulty eating, drinking, or talking due to asthma symptoms.  You develop chest pain.  You develop a fast heartbeat.  You have a bluish color to your lips or fingernails.  You are lightheaded, dizzy, or faint.  Your peak flow is less than 50% of your personal best.  You have a fever or persistent symptoms for more than 2 3 days.  You have a fever and symptoms suddenly get worse. MAKE SURE YOU:   Understand these  instructions.  Will watch your condition.  Will get help right away if you are not doing well or get worse. Document Released: 01/14/2005 Document Revised: 09/16/2012 Document Reviewed: 08/13/2012 [employer] Patient Information 2014 [employer].  

## 2013-05-21 ENCOUNTER — Other Ambulatory Visit: Payer: Self-pay | Admitting: Internal Medicine

## 2013-05-21 DIAGNOSIS — J441 Chronic obstructive pulmonary disease with (acute) exacerbation: Secondary | ICD-10-CM

## 2013-05-21 LAB — URINALYSIS, COMPLETE
Bacteria, UA: NONE SEEN
Bilirubin Urine: NEGATIVE
Casts: NONE SEEN
Crystals: NONE SEEN
Glucose, UA: NEGATIVE mg/dL
Ketones, ur: NEGATIVE mg/dL
Leukocytes, UA: NEGATIVE
NITRITE: NEGATIVE
Protein, ur: NEGATIVE mg/dL
Specific Gravity, Urine: 1.027 (ref 1.005–1.030)
Squamous Epithelial / LPF: NONE SEEN
Urobilinogen, UA: 0.2 mg/dL (ref 0.0–1.0)
pH: 6 (ref 5.0–8.0)

## 2013-05-21 MED ORDER — ALBUTEROL SULFATE HFA 108 (90 BASE) MCG/ACT IN AERS: 2.0000 | INHALATION_SPRAY | Freq: Four times a day (QID) | RESPIRATORY_TRACT | Status: DC | PRN

## 2013-05-21 MED ORDER — FLUTICASONE-SALMETEROL 100-50 MCG/DOSE IN AEPB: 1.0000 | INHALATION_SPRAY | Freq: Two times a day (BID) | RESPIRATORY_TRACT | Status: DC

## 2013-05-26 ENCOUNTER — Ambulatory Visit: Payer: Self-pay

## 2013-06-03 ENCOUNTER — Telehealth: Payer: Self-pay | Admitting: Emergency Medicine

## 2013-06-03 NOTE — Telephone Encounter (Signed)
Left message for pt to call for lab results 

## 2013-06-03 NOTE — Telephone Encounter (Signed)
Message copied by Darlis LoanSMITH, Deardra Hinkley D on Thu Jun 03, 2013 12:55 PM ------      Message from: Jeanann LewandowskyJEGEDE, OLUGBEMIGA E      Created: Wed Jun 02, 2013  4:33 PM       Please inform patient that his laboratory tests results are mostly within normal ------

## 2013-06-20 ENCOUNTER — Encounter (HOSPITAL_COMMUNITY): Payer: Self-pay | Admitting: Emergency Medicine

## 2013-06-20 ENCOUNTER — Emergency Department (HOSPITAL_COMMUNITY)
Admission: EM | Admit: 2013-06-20 | Discharge: 2013-06-20 | Disposition: A | Discharge status: Home / Self Care | Payer: Self-pay | Attending: Emergency Medicine | Admitting: Emergency Medicine

## 2013-06-20 DIAGNOSIS — I83229 Varicose veins of left lower extremity with both ulcer of unspecified site and inflammation: Principal | ICD-10-CM

## 2013-06-20 DIAGNOSIS — Z79899 Other long term (current) drug therapy: Secondary | ICD-10-CM | POA: Insufficient documentation

## 2013-06-20 DIAGNOSIS — Z87891 Personal history of nicotine dependence: Secondary | ICD-10-CM | POA: Insufficient documentation

## 2013-06-20 DIAGNOSIS — I83019 Varicose veins of right lower extremity with ulcer of unspecified site: Secondary | ICD-10-CM

## 2013-06-20 DIAGNOSIS — I83219 Varicose veins of right lower extremity with both ulcer of unspecified site and inflammation: Secondary | ICD-10-CM | POA: Insufficient documentation

## 2013-06-20 DIAGNOSIS — J45909 Unspecified asthma, uncomplicated: Secondary | ICD-10-CM | POA: Insufficient documentation

## 2013-06-20 DIAGNOSIS — B353 Tinea pedis: Secondary | ICD-10-CM | POA: Insufficient documentation

## 2013-06-20 DIAGNOSIS — R000 Tachycardia, unspecified: Secondary | ICD-10-CM | POA: Insufficient documentation

## 2013-06-20 DIAGNOSIS — L97919 Non-pressure chronic ulcer of unspecified part of right lower leg with unspecified severity: Principal | ICD-10-CM | POA: Insufficient documentation

## 2013-06-20 DIAGNOSIS — L97929 Non-pressure chronic ulcer of unspecified part of left lower leg with unspecified severity: Principal | ICD-10-CM

## 2013-06-20 DIAGNOSIS — IMO0002 Reserved for concepts with insufficient information to code with codable children: Secondary | ICD-10-CM | POA: Insufficient documentation

## 2013-06-20 DIAGNOSIS — B351 Tinea unguium: Secondary | ICD-10-CM | POA: Insufficient documentation

## 2013-06-20 DIAGNOSIS — I872 Venous insufficiency (chronic) (peripheral): Secondary | ICD-10-CM

## 2013-06-20 MED ORDER — TRAMADOL HCL 50 MG PO TABS: 50.0000 mg | ORAL_TABLET | Freq: Four times a day (QID) | ORAL | Status: DC | PRN

## 2013-06-20 MED ORDER — TERBINAFINE HCL 1 % EX CREA: 1.0000 "application " | TOPICAL_CREAM | Freq: Two times a day (BID) | CUTANEOUS | Status: DC

## 2013-06-20 MED ORDER — CEPHALEXIN 500 MG PO CAPS: 500.0000 mg | ORAL_CAPSULE | Freq: Four times a day (QID) | ORAL | Status: DC

## 2013-06-20 NOTE — ED Notes (Signed)
Pt reports hx of wound to right lower leg over a year ago, having increase in pain and wound opened back up approx 1 week ago. Ambulatory and no acute distress noted at triage. No drainage or swelling noted to leg.

## 2013-06-20 NOTE — ED Provider Notes (Signed)
CSN: [id number]     Arrival date & time 06/20/13  1606 History  This chart was scribed for non-physician practitioner, Arthor CaptainAbigail Romin Divita, PA-C, working with Rolan BuccoMelanie Belfi, MD by Shari HeritageAisha Amuda, ED Scribe. This patient was seen in room TR07C/TR07C and the patient's care was started at 4:27 PM.     Chief Complaint  Patient presents with  . Leg Pain    The history is provided by the patient. No language interpreter was used.    HPI Comments: Samuel [name] is a 43 y.o. male who presents to the Emergency Department complaining of moderate, shooting right leg pain with ulceration to the right medial calf that began 1 week ago. Patient states that pain is worse with weight bearing. He has taken ibuprofen today with no pain relief. Patient also has discoloration to his right lower leg. He further reports that he has a history of similar symptoms related to venous insufficiency and his last episode was 1 year ago. He denies fever, chills, nausea, vomiting, numbness or weakness of the extremities or other symptoms at this time. Patient's other medical history includes asthma. He is a former smoker.   Past Medical History  Diagnosis Date  . Asthma   . Ulcer     venous insufficiency   History reviewed. No pertinent past surgical history. Family History  Problem Relation Age of Onset  . Asthma Mother   . Hypertension Mother    History  Substance Use Topics  . Smoking status: Former Smoker -- 1.00 packs/day for 15 years    Types: Cigarettes    Quit date: 10/28/2012  . Smokeless tobacco: Never Used  . Alcohol Use: 2.4 oz/week    2 Cans of beer, 2 Shots of liquor per week    Review of Systems  Constitutional: Negative for fever and chills.  Gastrointestinal: Negative for nausea and vomiting.  Musculoskeletal: Positive for myalgias (right leg pain).  Skin: Positive for wound (right lower leg).  Neurological: Negative for weakness and numbness.     Allergies  Review of patient's allergies  indicates no known allergies.  Home Medications   Prior to Admission medications   Medication Sig Start Date End Date Taking? Authorizing Provider  albuterol (PROVENTIL HFA;VENTOLIN HFA) 108 (90 BASE) MCG/ACT inhaler Inhale 2 puffs into the lungs every 6 (six) hours as needed for wheezing or shortness of breath. 05/21/13   Jeanann Lewandowskylugbemiga Jegede, MD  aspirin EC 325 MG tablet Take 325 mg by mouth daily as needed for pain.    Historical Provider, MD  famotidine (PEPCID) 20 MG tablet One at bedtime 11/23/12   Nyoka CowdenMichael B Wert, MD  Fluticasone-Salmeterol (ADVAIR) 100-50 MCG/DOSE AEPB Inhale 1 puff into the lungs 2 (two) times daily. 05/21/13   Jeanann Lewandowskylugbemiga Jegede, MD  hydrocortisone cream 1 % Apply 1 application topically daily as needed (rash).    Historical Provider, MD  neomycin-bacitracin-polymyxin (NEOSPORIN) OINT Apply 1 application topically 2 (two) times daily as needed (wound care).    Historical Provider, MD  olmesartan-hydrochlorothiazide (BENICAR HCT) 20-12.5 MG per tablet Take 1 tablet by mouth daily. 05/20/13   Jeanann Lewandowskylugbemiga Jegede, MD  pantoprazole (PROTONIX) 40 MG tablet Take 1 tablet (40 mg total) by mouth daily. Take 30-60 min before first meal of the day 05/20/13   Jeanann Lewandowskylugbemiga Jegede, MD  predniSONE (DELTASONE) 20 MG tablet Take 1 tablet (20 mg total) by mouth daily with breakfast. 05/20/13   Jeanann Lewandowskylugbemiga Jegede, MD   Triage Vitals: BP 138/83  Pulse 114  Temp(Src) 98.7 F (37.1  C) (Oral)  Resp 18  SpO2 94% Physical Exam  Nursing note and vitals reviewed. Constitutional: He is oriented to person, place, and time. He appears well-developed and well-nourished. No distress.  HENT:  Head: Normocephalic and atraumatic.  Eyes: EOM are normal.  Neck: Neck supple. No tracheal deviation present.  Cardiovascular:  HR 114.  Pulmonary/Chest: Effort normal. No respiratory distress.  Musculoskeletal: Normal range of motion. He exhibits edema.  Onychomycosis and tinea pedis to the right foot.  Normal  pulses in right lower extremity. 1+ pitting edema of right lower leg up to the knee. Chronic venous stasis dermatitis and tattooing of right leg.  Area of ulceration with yellow crusting to right medial calf that is warm with no weeping or oozing, no erythema.  Neurological: He is alert and oriented to person, place, and time.  Skin: Skin is warm and dry.  Psychiatric: He has a normal mood and affect. His behavior is normal.    ED Course  Procedures (including critical care time) DIAGNOSTIC STUDIES: Oxygen Saturation is 94% on room air, adequate by my interpretation.    COORDINATION OF CARE: 4:37 PM- Patient presents with venous statis dermatitis and venous ulcer of right leg. Will discharge home with prescriptions for Keflex, Tramadol, and Lamisil. Patient informed of current plan for treatment and evaluation and agrees with plan at this time.    MDM   Final diagnoses:  Venous stasis dermatitis of right lower extremity  Venous ulcer of right leg  Onychomycosis  Tinea pedis    BP 138/83  Pulse 114  Temp(Src) 98.7 F (37.1 C) (Oral)  Resp 18  SpO2 94% Patient is not diabetic. Last Hgb A1C 5.0 (4/23) Chronic venous skin changes and new stasis ulcer. Will wrap with ace wrap for compression. Treat with keflex.  Tachycardic, but non septic appearing and afebrile. Patient / Family / Caregiver informed of clinical course, understand medical decision-making process, and agree with plan.    I personally performed the services described in this documentation, which was scribed in my presence. The recorded information has been reviewed and is accurate.     Arthor Captain, PA-C 06/20/13 1714

## 2013-06-20 NOTE — Discharge Instructions (Signed)
Stasis Dermatitis Stasis dermatitis occurs when veins lose the ability to pump blood back to the heart (poor venous circulation). It causes a reddish-purple to brownish scaly, itchy rash on the legs. The rash comes from pooling of blood (stasis). CAUSES  This occurs because the veins do not work very well anymore or because pressure may be increased in the veins due to other conditions. With blood pooling, the increased pressure in the tiny blood vessels (capillaries) causes fluid to leak out of the capillaries into the tissue. The extra fluid makes it harder for the blood to feed the cells and get rid of waste products. SYMPTOMS  Stasis dermatitis appears as red, scaly, itchy patches on the legs. A yellowish or light brown discoloration is also present. Due to scratching or other injury, these patches can become an ulcer. This ulcer may remain for long periods of time. The ulcer can also become infected. Swelling of the legs is often present with stasis dermatitis. If the leg is swollen, this increases the risk of infection and further damage to the skin. Sometimes, intense itching, tingling, and burning occurs before signs of stasis dermatitis appear. You may find yourself scratching the insides of your ankles or rubbing your ankles together before the rash appears. After healing, there are often brown spots on the affected skin. DIAGNOSIS  Your caregiver makes this diagnosis based on an exam. Other tests may be done to better understand the cause. TREATMENT  If underlying conditions are present, they must be treated. Some of these conditions are heart failure, thyroid problems, poor nutrition, and varicose veins.  Cortisone creams and ointments applied to the skin (topically) may be needed, as well as medicine to reduce swelling in the legs (diuretics).  Compression stockings or an elastic wrap may also be needed to reduce swelling.  If there is an infection, antibiotic medicines may also be  used. HOME CARE INSTRUCTIONS   Try to rest and raise (elevate) the affected leg above the level of the heart, if possible.  Burow's solution wet packs applied for 30 minutes, 3 times daily, will help the weepy rash. Stop using the packs before your skin gets too dry. You can also use a mixture of 3 parts white vinegar to 1 quart water.  Grease your legs daily with ointments, such as petroleum jelly, to fight dryness.  Avoid scratching or injuring the area. SEEK IMMEDIATE MEDICAL CARE IF:   Your rash gets worse.  An ulcer forms.  You have an oral temperature above 102 F (38.9 C), not controlled by medicine.  You have any other severe symptoms. Document Released: 04/25/2005 Document Revised: 04/08/2011 Document Reviewed: 06/05/2009 [facility] Patient Information 2014 [place], [place].  Stasis Ulcer Stasis ulcers occur in the legs when the circulation is damaged. An ulcer may look like a small hole in the skin.  CAUSES Stasis ulcers occur because your veins do not work properly. Veins have valves that help the blood return to the heart. If these valves do not work right, blood flows backwards and backs up into the veins near the skin. This condition causes the veins to become larger because of increased pressure and may lead to a stasis ulcer. SYMPTOMS   Shallow (superficial) sore on the leg.  Clear drainage or weeping from the sore.  Leg pain or a feeling of heaviness. This may be worse at the end of the day.  Leg swelling.  Skin color changes. DIAGNOSIS  Your caregiver will make a diagnosis by examining your  leg. Your caregiver may order tests such as an ultrasound or other studies to evaluate the blood flow of the leg. HOME CARE INSTRUCTIONS   Do not stand or sit in one position for long periods of time. Do not sit with your legs crossed. Rest with your legs raised during the day. If possible, it is best if you can elevate your legs above your heart for 30 minutes, 3 to 4  times a day.  Wear elastic stockings or support hose. Do not wear other tight encircling garments around legs, pelvis, or waist. This causes increased pressure in your veins. If your caregiver has applied compressive medicated wraps, use them as instructed.  Walk as much as possible to increase blood flow. If you are taking long rides in a car or plane, take a break to walk around every 2 hours. If not already on aspirin, take a baby aspirin before long trips unless you have medical reasons that prohibit this.  Raise the foot of your bed at night with 2-inch blocks if approved by your caregiver. This may not be desirable if you have heart failure or breathing problems.  If you get a cut in the skin over the vein and the vein bleeds, lie down with your leg raised and gently clean the area with a clean cloth. Apply pressure on the cut until the bleeding stops. Then place a dressing on the cut. See your caregiver if it continues to bleed or needs stitches. Also, see your caregiver if you develop an infection.Signs of an infection include a fever, redness, increased pain, and drainage of pus.  If your caregiver has given you a follow-up appointment, it is very important to keep that appointment. Not keeping the appointment could result in a chronic or permanent injury, pain, and disability. If there is any problem keeping the appointment, call your caregiver for assistance. SEEK IMMEDIATE MEDICAL CARE IF:  The ulcer area starts to break down.  You have pain, redness, tenderness, pus, or hard swelling in your leg over a vein or near the ulcer.  Your leg pain is uncomfortable.  You develop an unexplained fever.  You develop chest pain or shortness of breath. Document Released: 10/09/2000 Document Revised: 04/08/2011 Document Reviewed: 05/06/2010 [facility] Patient Information 2014 [place], [place].  Athlete's Foot Athlete's foot (tinea pedis) is a fungal infection of the skin on the feet. It often  occurs on the skin between the toes or underneath the toes. It can also occur on the soles of the feet. Athlete's foot is more likely to occur in hot, humid weather. Not washing your feet or changing your socks often enough can contribute to athlete's foot. The infection can spread from person to person (contagious). CAUSES Athlete's foot is caused by a fungus. This fungus thrives in warm, moist places. Most people get athlete's foot by sharing shower stalls, towels, and wet floors with an infected person. People with weakened immune systems, including those with diabetes, may be more likely to get athlete's foot. SYMPTOMS   Itchy areas between the toes or on the soles of the feet.  White, flaky, or scaly areas between the toes or on the soles of the feet.  Tiny, intensely itchy blisters between the toes or on the soles of the feet.  Tiny cuts on the skin. These cuts can develop a bacterial infection.  Thick or discolored toenails. DIAGNOSIS  Your caregiver can usually tell what the problem is by doing a physical exam. Your caregiver may also  take a skin sample from the rash area. The skin sample may be examined under a microscope, or it may be tested to see if fungus will grow in the sample. A sample may also be taken from your toenail for testing. TREATMENT  Over-the-counter and prescription medicines can be used to kill the fungus. These medicines are available as powders or creams. Your caregiver can suggest medicines for you. Fungal infections respond slowly to treatment. You may need to continue using your medicine for several weeks. PREVENTION   Do not share towels.  Wear sandals in wet areas, such as shared locker rooms and shared showers.  Keep your feet dry. Wear shoes that allow air to circulate. Wear cotton or wool socks. HOME CARE INSTRUCTIONS   Take medicines as directed by your caregiver. Do not use steroid creams on athlete's foot.  Keep your feet clean and cool. Wash  your feet daily and dry them thoroughly, especially between your toes.  Change your socks every day. Wear cotton or wool socks. In hot climates, you may need to change your socks 2 to 3 times per day.  Wear sandals or canvas tennis shoes with good air circulation.  If you have blisters, soak your feet in Burow's solution or Epsom salts for 20 to 30 minutes, 2 times a day to dry out the blisters. Make sure you dry your feet thoroughly afterward. SEEK MEDICAL CARE IF:   You have a fever.  You have swelling, soreness, warmth, or redness in your foot.  You are not getting better after 7 days of treatment.  You are not completely cured after 30 days.  You have any problems caused by your medicines. MAKE SURE YOU:   Understand these instructions.  Will watch your condition.  Will get help right away if you are not doing well or get worse. Document Released: 01/12/2000 Document Revised: 04/08/2011 Document Reviewed: 11/02/2010 [facility] Patient Information 2014 [place], [place].

## 2013-06-20 NOTE — ED Notes (Signed)
Pt with wound to RLE. States he scratched an old wound.

## 2013-06-20 NOTE — ED Provider Notes (Signed)
Medical screening examination/treatment/procedure(s) were performed by non-physician practitioner and as supervising physician I was immediately available for consultation/collaboration.   EKG Interpretation None        Ayane Delancey, MD 06/20/13 1813 

## 2013-06-30 ENCOUNTER — Ambulatory Visit (HOSPITAL_BASED_OUTPATIENT_CLINIC_OR_DEPARTMENT_OTHER): Payer: Self-pay | Attending: Internal Medicine | Admitting: Radiology

## 2013-06-30 VITALS — Ht 69.0 in | Wt 345.0 lb

## 2013-06-30 DIAGNOSIS — R0989 Other specified symptoms and signs involving the circulatory and respiratory systems: Secondary | ICD-10-CM | POA: Insufficient documentation

## 2013-06-30 DIAGNOSIS — R0609 Other forms of dyspnea: Secondary | ICD-10-CM | POA: Insufficient documentation

## 2013-06-30 DIAGNOSIS — G4733 Obstructive sleep apnea (adult) (pediatric): Secondary | ICD-10-CM | POA: Insufficient documentation

## 2013-07-04 DIAGNOSIS — G4733 Obstructive sleep apnea (adult) (pediatric): Secondary | ICD-10-CM

## 2013-07-04 NOTE — Sleep Study (Addendum)
   NAME: Samuel [name] DATE OF BIRTH:  1970/05/20 MEDICAL RECORD NUMBER [id number]  LOCATION: [facility]  PHYSICIAN: Adanely Reynoso D Ana Woodroof  DATE OF STUDY: 06/30/2013  SLEEP STUDY TYPE: Nocturnal Polysomnogram               REFERRING PHYSICIAN: Jeanann Lewandowsky, MD  INDICATION FOR STUDY: Hypersomnia with sleep apnea  EPWORTH SLEEPINESS SCORE:   15/24 HEIGHT: 5\' 9"  (175.3 cm)  WEIGHT: 156.491 kg (345 lb)    Body mass index is 50.92 kg/(m^2).  NECK SIZE: 22 in.  MEDICATIONS: Charted for review  SLEEP ARCHITECTURE: Total sleep time 256.5 minutes with sleep efficiency 70.6%. Stage I was 22.2%, stage II 65.1%, stage III absent, REM 12.7% of total sleep time. Sleep latency 58 minutes, REM latency 258.5 minutes, awake after sleep onset 51.5 minutes, arousal index 51. That time medication: None. Sleep was repeatedly interrupted by a sustained intervals of wakefulness lasting 10-15 minutes. Sustained sleep onset was not achieved until after midnight.  RESPIRATORY DATA: Apnea hypopneas index (AHI) 128 per hour. 546 total events scored including 204 obstructive apneas, 15 central apneas, 297 hypopneas.. All events were nonsupine. REM AHI 19.5 per hour He could not achieve enough sleep in the first hours of the test to meet protocol requirements for split CPAP titration.  OXYGEN DATA: Moderately loud snoring with oxygen desaturation to a nadir of 73% and mean oxygen saturation through the study of 88.9% on room air.  CARDIAC DATA: Sinus rhythm with PVCs  MOVEMENT/PARASOMNIA: No significant movement disturbance, bathroom x3  IMPRESSION/ RECOMMENDATION:   1) Very severe obstructive sleep apnea/hypopnea syndrome, AHI 128 per hour with nonsupine events. Moderately loud snoring with oxygen desaturation to a nadir of 73% and mean oxygen saturation through the study of 88.9% on room air.  2) He could not maintain sleep well enough to meet protocol requirements for split CPAP  titration. CPAP therapy would be the usual first choice for intervention with scores in this range. This patient can return for a dedicated CPAP titration study if appropriate.  Signed Jetty Duhamel, MD Waymon Budge Diplomate, American Board of Sleep Medicine  ELECTRONICALLY SIGNED ON:  07/04/2013, 2:47 PM [facility] PH: [phone]   FX: [phone] ACCREDITED BY THE AMERICAN ACADEMY OF SLEEP MEDICINE

## 2013-07-09 ENCOUNTER — Ambulatory Visit: Payer: Self-pay

## 2013-09-02 ENCOUNTER — Ambulatory Visit: Payer: Self-pay | Attending: Internal Medicine | Admitting: Internal Medicine

## 2013-09-02 VITALS — BP 149/90 | HR 101 | Temp 98.7°F | Resp 15 | Ht 69.0 in | Wt 357.0 lb

## 2013-09-02 DIAGNOSIS — I872 Venous insufficiency (chronic) (peripheral): Secondary | ICD-10-CM | POA: Insufficient documentation

## 2013-09-02 DIAGNOSIS — J45909 Unspecified asthma, uncomplicated: Secondary | ICD-10-CM | POA: Insufficient documentation

## 2013-09-02 DIAGNOSIS — L97909 Non-pressure chronic ulcer of unspecified part of unspecified lower leg with unspecified severity: Secondary | ICD-10-CM | POA: Insufficient documentation

## 2013-09-02 DIAGNOSIS — G4733 Obstructive sleep apnea (adult) (pediatric): Secondary | ICD-10-CM | POA: Insufficient documentation

## 2013-09-02 DIAGNOSIS — L98499 Non-pressure chronic ulcer of skin of other sites with unspecified severity: Secondary | ICD-10-CM

## 2013-09-02 DIAGNOSIS — I10 Essential (primary) hypertension: Secondary | ICD-10-CM | POA: Insufficient documentation

## 2013-09-02 DIAGNOSIS — E669 Obesity, unspecified: Secondary | ICD-10-CM | POA: Insufficient documentation

## 2013-09-02 DIAGNOSIS — I83009 Varicose veins of unspecified lower extremity with ulcer of unspecified site: Secondary | ICD-10-CM | POA: Insufficient documentation

## 2013-09-02 DIAGNOSIS — L97901 Non-pressure chronic ulcer of unspecified part of unspecified lower leg limited to breakdown of skin: Secondary | ICD-10-CM

## 2013-09-02 MED ORDER — LISINOPRIL 10 MG PO TABS: 10.0000 mg | ORAL_TABLET | Freq: Every day | ORAL | Status: DC

## 2013-09-02 MED ORDER — CEPHALEXIN 500 MG PO CAPS: 500.0000 mg | ORAL_CAPSULE | Freq: Four times a day (QID) | ORAL | Status: DC

## 2013-09-02 MED ORDER — TRAMADOL HCL 50 MG PO TABS: 50.0000 mg | ORAL_TABLET | Freq: Four times a day (QID) | ORAL | Status: DC | PRN

## 2013-09-02 NOTE — Progress Notes (Signed)
Patient ID: Samuel [name], male   DOB: 1970-06-30, 43 y.o.   MRN: [id number]   Samuel [name], is a 43 y.o. male  [id number]  [id number]  DOB - 1970-06-30  Chief Complaint  Patient presents with  . Follow-up        Subjective:   Samuel [name] is a 43 y.o. male here today for a follow up visit. Patient has right lower limb chronic ulcer from the lymphedema that has healed but recently has a broken down area with discharges. Patient's medical history includes asthma, hypertension, obesity and venous stasis. He has been managed by a vascular surgeon for a long time, he was placed on Unna boot and compression stocking. He is here today because of a new ulcer. He recently had a sleep study which confirmed obstructive sleep apnea, he is required to come back for CPAP titration. His blood pressure has not been controlled on 5 mg of lisinopril. Patient has No headache, No chest pain, No abdominal pain - No Nausea, No new weakness tingling or numbness, No Cough - SOB.  Problem  Venous Ulcer, Limited to Breakdown of Skin  Essential Hypertension    ALLERGIES: No Known Allergies  PAST MEDICAL HISTORY: Past Medical History  Diagnosis Date  . Asthma   . Ulcer     venous insufficiency    MEDICATIONS AT HOME: Prior to Admission medications   Medication Sig Start Date End Date Taking? Authorizing Provider  albuterol (PROVENTIL HFA;VENTOLIN HFA) 108 (90 BASE) MCG/ACT inhaler Inhale 2 puffs into the lungs every 6 (six) hours as needed for wheezing or shortness of breath. 05/21/13  Yes Samuel Lewandowskylugbemiga Florrie Ramires, MD  Fluticasone-Salmeterol (ADVAIR) 100-50 MCG/DOSE AEPB Inhale 1 puff into the lungs 2 (two) times daily. 05/21/13  Yes Samuel Lewandowskylugbemiga Judeth Gilles, MD  ibuprofen (ADVIL,MOTRIN) 200 MG tablet Take 200 mg by mouth every 6 (six) hours as needed for mild pain.   Yes Historical Provider, MD  lisinopril (PRINIVIL,ZESTRIL) 10 MG tablet Take 1 tablet (10 mg total) by mouth daily. 09/02/13  Yes  Samuel Lewandowskylugbemiga Nabila Albarracin, MD  neomycin-bacitracin-polymyxin (NEOSPORIN) OINT Apply 1 application topically 2 (two) times daily as needed (wound care).   Yes Historical Provider, MD  terbinafine (LAMISIL AT) 1 % cream Apply 1 application topically 2 (two) times daily. 06/20/13  Yes Samuel CaptainAbigail Harris, PA-C  traMADol (ULTRAM) 50 MG tablet Take 1 tablet (50 mg total) by mouth every 6 (six) hours as needed. 09/02/13  Yes Samuel Lewandowskylugbemiga Josip Merolla, MD  cephALEXin (KEFLEX) 500 MG capsule Take 1 capsule (500 mg total) by mouth 4 (four) times daily. 09/02/13   Samuel Lewandowskylugbemiga Samuel Haas, MD     Objective:   Filed Vitals:   09/02/13 0925  BP: 149/90  Pulse: 101  Temp: 98.7 F (37.1 C)  TempSrc: Oral  Resp: 15  Height: 5\' 9"  (1.753 m)  Weight: 357 lb (161.934 kg)  SpO2: 92%    Exam General appearance : Awake, alert, not in any distress. Speech Clear. Not toxic looking HEENT: Atraumatic and Normocephalic, pupils equally reactive to light and accomodation Neck: supple, no JVD. No cervical lymphadenopathy.  Chest:Good air entry bilaterally, no added sounds  CVS: S1 S2 regular, no murmurs.  Abdomen: Bowel sounds present, Non tender and not distended with no gaurding, rigidity or rebound. Extremities: Right lower extremity with chronic ulcer but healed areas with scar, a small area of eschar has broken down and now having minimal discharge Neurology: Awake alert, and oriented X 3, CN II-XII intact, Non focal Skin:No Rash Wounds:N/A  Data  Review Lab Results  Component Value Date   HGBA1C 5.5 05/20/2013     Assessment & Plan   1. Essential hypertension Increase lisinopril to 10 mg by mouth daily from 5 mg for better blood pressure control - lisinopril (PRINIVIL,ZESTRIL) 10 MG tablet; Take 1 tablet (10 mg total) by mouth daily.  Dispense: 90 tablet; Refill: 3  2. OSA (obstructive sleep apnea)  - Cpap titration; Future  3. Venous ulcer, limited to breakdown of skin  - traMADol (ULTRAM) 50 MG tablet; Take 1 tablet  (50 mg total) by mouth every 6 (six) hours as needed.  Dispense: 60 tablet; Refill: 0 - cephALEXin (KEFLEX) 500 MG capsule; Take 1 capsule (500 mg total) by mouth 4 (four) times daily.  Dispense: 20 capsule; Refill: 0  Patient was extensively counseled about nutrition and exercise    Return in about 3 months (around 12/03/2013), or if symptoms worsen or fail to improve, for Follow up HTN.  The patient was given clear instructions to go to ER or return to medical center if symptoms don't improve, worsen or new problems develop. The patient verbalized understanding. The patient was told to call to get lab results if they [name]'t heard anything in the next week.   This note has been created with Education officer, environmental. Any transcriptional errors are unintentional.    Samuel Lewandowsky, MD, MHA, FACP, FAAP [facility] [place], [place] [phone]   09/02/2013, 10:59 AM

## 2013-09-02 NOTE — Patient Instructions (Signed)
DASH Eating Plan DASH stands for "Dietary Approaches to Stop Hypertension." The DASH eating plan is a healthy eating plan that has been shown to reduce high blood pressure (hypertension). Additional health benefits may include reducing the risk of type 2 diabetes mellitus, heart disease, and stroke. The DASH eating plan may also help with weight loss. WHAT DO I NEED TO KNOW ABOUT THE DASH EATING PLAN? For the DASH eating plan, you will follow these general guidelines:  Choose foods with a percent daily value for sodium of less than 5% (as listed on the food label).  Use salt-free seasonings or herbs instead of table salt or sea salt.  Check with your health care provider or pharmacist before using salt substitutes.  Eat lower-sodium products, often labeled as "lower sodium" or "no salt added."  Eat fresh foods.  Eat more vegetables, fruits, and low-fat dairy products.  Choose whole grains. Look for the word "whole" as the first word in the ingredient list.  Choose fish and skinless chicken or turkey more often than red meat. Limit fish, poultry, and meat to 6 oz (170 g) each day.  Limit sweets, desserts, sugars, and sugary drinks.  Choose heart-healthy fats.  Limit cheese to 1 oz (28 g) per day.  Eat more home-cooked food and less restaurant, buffet, and fast food.  Limit fried foods.  Cook foods using methods other than frying.  Limit canned vegetables. If you do use them, rinse them well to decrease the sodium.  When eating at a restaurant, ask that your food be prepared with less salt, or no salt if possible. WHAT FOODS CAN I EAT? Seek help from a dietitian for individual calorie needs. Grains Whole grain or whole wheat bread. Brown rice. Whole grain or whole wheat pasta. Quinoa, bulgur, and whole grain cereals. Low-sodium cereals. Corn or whole wheat flour tortillas. Whole grain cornbread. Whole grain crackers. Low-sodium crackers. Vegetables Fresh or frozen vegetables  (raw, steamed, roasted, or grilled). Low-sodium or reduced-sodium tomato and vegetable juices. Low-sodium or reduced-sodium tomato sauce and paste. Low-sodium or reduced-sodium canned vegetables.  Fruits All fresh, canned (in natural juice), or frozen fruits. Meat and Other Protein Products Ground beef (85% or leaner), grass-fed beef, or beef trimmed of fat. Skinless chicken or turkey. Ground chicken or turkey. Pork trimmed of fat. All fish and seafood. Eggs. Dried beans, peas, or lentils. Unsalted nuts and seeds. Unsalted canned beans. Dairy Low-fat dairy products, such as skim or 1% milk, 2% or reduced-fat cheeses, low-fat ricotta or cottage cheese, or plain low-fat yogurt. Low-sodium or reduced-sodium cheeses. Fats and Oils Tub margarines without trans fats. Light or reduced-fat mayonnaise and salad dressings (reduced sodium). Avocado. Safflower, olive, or canola oils. Natural peanut or almond butter. Other Unsalted popcorn and pretzels. The items listed above may not be a complete list of recommended foods or beverages. Contact your dietitian for more options. WHAT FOODS ARE NOT RECOMMENDED? Grains White bread. White pasta. White rice. Refined cornbread. Bagels and croissants. Crackers that contain trans fat. Vegetables Creamed or fried vegetables. Vegetables in a cheese sauce. Regular canned vegetables. Regular canned tomato sauce and paste. Regular tomato and vegetable juices. Fruits Dried fruits. Canned fruit in light or heavy syrup. Fruit juice. Meat and Other Protein Products Fatty cuts of meat. Ribs, chicken wings, bacon, sausage, bologna, salami, chitterlings, fatback, hot dogs, bratwurst, and packaged luncheon meats. Salted nuts and seeds. Canned beans with salt. Dairy Whole or 2% milk, cream, half-and-half, and cream cheese. Whole-fat or sweetened yogurt. Full-fat   cheeses or blue cheese. Nondairy creamers and whipped toppings. Processed cheese, cheese spreads, or cheese  curds. Condiments Onion and garlic salt, seasoned salt, table salt, and sea salt. Canned and packaged gravies. Worcestershire sauce. Tartar sauce. Barbecue sauce. Teriyaki sauce. Soy sauce, including reduced sodium. Steak sauce. Fish sauce. Oyster sauce. Cocktail sauce. Horseradish. Ketchup and mustard. Meat flavorings and tenderizers. Bouillon cubes. Hot sauce. Tabasco sauce. Marinades. Taco seasonings. Relishes. Fats and Oils Butter, stick margarine, lard, shortening, ghee, and bacon fat. Coconut, palm kernel, or palm oils. Regular salad dressings. Other Pickles and olives. Salted popcorn and pretzels. The items listed above may not be a complete list of foods and beverages to avoid. Contact your dietitian for more information. WHERE CAN I FIND MORE INFORMATION? National Heart, Lung, and Blood Institute: [employer] Document Released: 01/03/2011 Document Revised: 05/31/2013 Document Reviewed: 11/18/2012 [facility] Patient Information 2015 [place], [place]. This information is not intended to replace advice given to you by your health care provider. Make sure you discuss any questions you have with your health care provider. Obesity Obesity is defined as having too much total body fat and a body mass index (BMI) of 30 or more. BMI is an estimate of body fat and is calculated from your height and weight. Obesity happens when you consume more calories than you can burn by exercising or performing daily physical tasks. Prolonged obesity can cause major illnesses or emergencies, such as:   Stroke.  Heart disease.  Diabetes.  Cancer.  Arthritis.  High blood pressure (hypertension).  High cholesterol.  Sleep apnea.  Erectile dysfunction.  Infertility problems. CAUSES   Regularly eating unhealthy foods.  Physical inactivity.  Certain disorders, such as an underactive thyroid (hypothyroidism), Cushing's syndrome, and polycystic ovarian  syndrome.  Certain medicines, such as steroids, some depression medicines, and antipsychotics.  Genetics.  Lack of sleep. DIAGNOSIS  A health care provider can diagnose obesity after calculating your BMI. Obesity will be diagnosed if your BMI is 30 or higher.  There are other methods of measuring obesity levels. Some other methods include measuring your skinfold thickness, your waist circumference, and comparing your hip circumference to your waist circumference. TREATMENT  A healthy treatment program includes some or all of the following:  Long-term dietary changes.  Exercise and physical activity.  Behavioral and lifestyle changes.  Medicine only under the supervision of your health care provider. Medicines may help, but only if they are used with diet and exercise programs. An unhealthy treatment program includes:  Fasting.  Fad diets.  Supplements and drugs. These choices do not succeed in long-term weight control.  HOME CARE INSTRUCTIONS   Exercise and perform physical activity as directed by your health care provider. To increase physical activity, try the following:  Use stairs instead of elevators.  Park farther away from store entrances.  Garden, bike, or walk instead of watching television or using the computer.  Eat healthy, low-calorie foods and drinks on a regular basis. Eat more fruits and vegetables. Use low-calorie cookbooks or take healthy cooking classes.  Limit fast food, sweets, and processed snack foods.  Eat smaller portions.  Keep a daily journal of everything you eat. There are many free websites to help you with this. It may be helpful to measure your foods so you can determine if you are eating the correct portion sizes.  Avoid drinking alcohol. Drink more water and drinks without calories.  Take vitamins and supplements only as recommended by your health care provider.  Weight-loss support groups, registered  dietitians, counselors, and  stress reduction education can also be very helpful. SEEK IMMEDIATE MEDICAL CARE IF:  You have chest pain or tightness.  You have trouble breathing or feel short of breath.  You have weakness or leg numbness.  You feel confused or have trouble talking.  You have sudden changes in your vision. MAKE SURE YOU:  Understand these instructions.  Will watch your condition.  Will get help right away if you are not doing well or get worse. Document Released: 02/22/2004 Document Revised: 05/31/2013 Document Reviewed: 02/20/2011 [facility] Patient Information 2015 [place]. This information is not intended to replace advice given to you by your health care provider. Make sure you discuss any questions you have with your health care provider. Stasis Ulcer Stasis ulcers occur in the legs when the circulation is damaged. An ulcer may look like a small hole in the skin.  CAUSES Stasis ulcers occur because your veins do not work properly. Veins have valves that help the blood return to the heart. If these valves do not work right, blood flows backwards and backs up into the veins near the skin. This condition causes the veins to become larger because of increased pressure and may lead to a stasis ulcer. SYMPTOMS   Shallow (superficial) sore on the leg.  Clear drainage or weeping from the sore.  Leg pain or a feeling of heaviness. This may be worse at the end of the day.  Leg swelling.  Skin color changes. DIAGNOSIS  Your caregiver will make a diagnosis by examining your leg. Your caregiver may order tests such as an ultrasound or other studies to evaluate the blood flow of the leg. HOME CARE INSTRUCTIONS   Do not stand or sit in one position for long periods of time. Do not sit with your legs crossed. Rest with your legs raised during the day. If possible, it is best if you can elevate your legs above your heart for 30 minutes, 3 to 4 times a day.  Wear elastic stockings or support  hose. Do not wear other tight encircling garments around legs, pelvis, or waist. This causes increased pressure in your veins. If your caregiver has applied compressive medicated wraps, use them as instructed.  Walk as much as possible to increase blood flow. If you are taking long rides in a car or plane, take a break to walk around every 2 hours. If not already on aspirin, take a baby aspirin before long trips unless you have medical reasons that prohibit this.  Raise the foot of your bed at night with 2-inch blocks if approved by your caregiver. This may not be desirable if you have heart failure or breathing problems.  If you get a cut in the skin over the vein and the vein bleeds, lie down with your leg raised and gently clean the area with a clean cloth. Apply pressure on the cut until the bleeding stops. Then place a dressing on the cut. See your caregiver if it continues to bleed or needs stitches. Also, see your caregiver if you develop an infection.Signs of an infection include a fever, redness, increased pain, and drainage of pus.  If your caregiver has given you a follow-up appointment, it is very important to keep that appointment. Not keeping the appointment could result in a chronic or permanent injury, pain, and disability. If there is any problem keeping the appointment, call your caregiver for assistance. SEEK IMMEDIATE MEDICAL CARE IF:  The ulcer area starts to break  down.  You have pain, redness, tenderness, pus, or hard swelling in your leg over a vein or near the ulcer.  Your leg pain is uncomfortable.  You develop an unexplained fever.  You develop chest pain or shortness of breath. Document Released: 10/09/2000 Document Revised: 04/08/2011 Document Reviewed: 05/06/2010 [facility] Patient Information 2015 [place], [place]. This information is not intended to replace advice given to you by your health care provider. Make sure you discuss any questions you have with your  health care provider.

## 2013-09-02 NOTE — Progress Notes (Signed)
Pt is here following up on his HTN, the ulcer on his right leg and his lack of sleep.

## 2013-09-05 ENCOUNTER — Encounter: Payer: Self-pay | Admitting: Internal Medicine

## 2013-09-09 ENCOUNTER — Ambulatory Visit: Payer: Self-pay

## 2013-11-07 ENCOUNTER — Ambulatory Visit (HOSPITAL_BASED_OUTPATIENT_CLINIC_OR_DEPARTMENT_OTHER): Payer: Self-pay | Attending: Internal Medicine

## 2013-12-22 ENCOUNTER — Ambulatory Visit: Payer: Self-pay

## 2014-01-10 ENCOUNTER — Other Ambulatory Visit: Payer: Self-pay | Admitting: Internal Medicine

## 2014-02-08 ENCOUNTER — Ambulatory Visit: Payer: Self-pay | Attending: Internal Medicine | Admitting: Internal Medicine

## 2014-02-08 ENCOUNTER — Encounter: Payer: Self-pay | Admitting: Internal Medicine

## 2014-02-08 ENCOUNTER — Ambulatory Visit (HOSPITAL_BASED_OUTPATIENT_CLINIC_OR_DEPARTMENT_OTHER): Payer: Self-pay | Admitting: *Deleted

## 2014-02-08 ENCOUNTER — Other Ambulatory Visit: Payer: Self-pay | Admitting: Internal Medicine

## 2014-02-08 VITALS — BP 150/90 | HR 69 | Temp 98.6°F | Resp 18 | Ht 69.0 in | Wt 365.0 lb

## 2014-02-08 DIAGNOSIS — I83009 Varicose veins of unspecified lower extremity with ulcer of unspecified site: Secondary | ICD-10-CM

## 2014-02-08 DIAGNOSIS — R7309 Other abnormal glucose: Secondary | ICD-10-CM | POA: Insufficient documentation

## 2014-02-08 DIAGNOSIS — L97911 Non-pressure chronic ulcer of unspecified part of right lower leg limited to breakdown of skin: Secondary | ICD-10-CM | POA: Insufficient documentation

## 2014-02-08 DIAGNOSIS — Z23 Encounter for immunization: Secondary | ICD-10-CM

## 2014-02-08 DIAGNOSIS — J45909 Unspecified asthma, uncomplicated: Secondary | ICD-10-CM | POA: Insufficient documentation

## 2014-02-08 DIAGNOSIS — I10 Essential (primary) hypertension: Secondary | ICD-10-CM | POA: Insufficient documentation

## 2014-02-08 DIAGNOSIS — R7303 Prediabetes: Secondary | ICD-10-CM | POA: Insufficient documentation

## 2014-02-08 DIAGNOSIS — Z87891 Personal history of nicotine dependence: Secondary | ICD-10-CM | POA: Insufficient documentation

## 2014-02-08 DIAGNOSIS — Z79899 Other long term (current) drug therapy: Secondary | ICD-10-CM | POA: Insufficient documentation

## 2014-02-08 DIAGNOSIS — Z9114 Patient's other noncompliance with medication regimen: Secondary | ICD-10-CM | POA: Insufficient documentation

## 2014-02-08 DIAGNOSIS — L97901 Non-pressure chronic ulcer of unspecified part of unspecified lower leg limited to breakdown of skin: Secondary | ICD-10-CM

## 2014-02-08 DIAGNOSIS — L98491 Non-pressure chronic ulcer of skin of other sites limited to breakdown of skin: Secondary | ICD-10-CM

## 2014-02-08 LAB — GLUCOSE, POCT (MANUAL RESULT ENTRY): POC GLUCOSE: 133 mg/dL — AB (ref 70–99)

## 2014-02-08 LAB — POCT GLYCOSYLATED HEMOGLOBIN (HGB A1C): Hemoglobin A1C: 5.8

## 2014-02-08 MED ORDER — LOSARTAN POTASSIUM-HCTZ 50-12.5 MG PO TABS: 1.0000 | ORAL_TABLET | Freq: Every day | ORAL | Status: DC

## 2014-02-08 MED ORDER — DOXYCYCLINE HYCLATE 100 MG PO TABS: 100.0000 mg | ORAL_TABLET | Freq: Two times a day (BID) | ORAL | Status: DC

## 2014-02-08 MED ORDER — TRAMADOL HCL 50 MG PO TABS
50.0000 mg | ORAL_TABLET | Freq: Four times a day (QID) | ORAL | Status: DC | PRN
Start: 2014-02-08 — End: 2014-07-15

## 2014-02-08 MED ORDER — TERBINAFINE HCL 1 % EX CREA: 1.0000 "application " | TOPICAL_CREAM | Freq: Two times a day (BID) | CUTANEOUS | Status: DC

## 2014-02-08 MED ORDER — BACITRACIN-NEOMYCIN-POLYMYXIN OINTMENT TUBE: 1.0000 "application " | TOPICAL_OINTMENT | Freq: Two times a day (BID) | CUTANEOUS | Status: DC

## 2014-02-08 NOTE — Progress Notes (Signed)
Patient ID: DEMETRUS PAVAO, male   DOB: November 21, 1970, 44 y.o.   MRN: [id number]   Mechel Schutter, is a 44 y.o. male  [id number]  [id number]  DOB - 07-27-1970  Chief Complaint  Patient presents with  . Follow-up  . Diabetes  . Wound Check        Subjective:   Khyrie Masi is a 44 y.o. male here today for a follow up visit. Patient has hypertension, noncompliant with medication. Patient has right lower limb chronic ulcer from the lymphedema that has healed but recently has a broken down area with discharges. The breakdown of skin is on and off. Patient's other medical history includes asthma, obesity and venous stasis. He has been managed by a vascular surgeon for a long time, he was placed on Unna boot and compression stocking. He is here today because of a new ulcer. Patient was on steroid in the past for asthma, and he developed gynecomastia, steroid has been stopped patient now on an inhaler only. Patient quit smoking about 4 months ago, does not drink alcohol. Patient has No headache, No chest pain, No abdominal pain - No Nausea, No new weakness tingling or numbness, No Cough - SOB.  Problem  Prediabetes    ALLERGIES: No Known Allergies  PAST MEDICAL HISTORY: Past Medical History  Diagnosis Date  . Asthma   . Ulcer     venous insufficiency    MEDICATIONS AT HOME: Prior to Admission medications   Medication Sig Start Date End Date Taking? Authorizing Provider  Fluticasone-Salmeterol (ADVAIR) 100-50 MCG/DOSE AEPB Inhale 1 puff into the lungs 2 (two) times daily. 05/21/13  Yes Quentin Angst, MD  neomycin-bacitracin-polymyxin (NEOSPORIN) OINT Apply 1 application topically 2 (two) times daily. 02/08/14  Yes Quentin Angst, MD  albuterol (PROVENTIL HFA;VENTOLIN HFA) 108 (90 BASE) MCG/ACT inhaler Inhale 2 puffs into the lungs every 6 (six) hours as needed for wheezing or shortness of breath. Patient not taking: Reported on 02/08/2014 05/21/13   Quentin Angst, MD  cephALEXin (KEFLEX) 500 MG capsule Take 1 capsule (500 mg total) by mouth 4 (four) times daily. Patient not taking: Reported on 02/08/2014 09/02/13   Quentin Angst, MD  doxycycline (VIBRA-TABS) 100 MG tablet Take 1 tablet (100 mg total) by mouth 2 (two) times daily. 02/08/14   Quentin Angst, MD  ibuprofen (ADVIL,MOTRIN) 200 MG tablet Take 200 mg by mouth every 6 (six) hours as needed for mild pain.    Historical Provider, MD  losartan-hydrochlorothiazide (HYZAAR) 50-12.5 MG per tablet Take 1 tablet by mouth daily. 02/08/14   Quentin Angst, MD  terbinafine (LAMISIL AT) 1 % cream Apply 1 application topically 2 (two) times daily. 02/08/14   Quentin Angst, MD  traMADol (ULTRAM) 50 MG tablet Take 1 tablet (50 mg total) by mouth every 6 (six) hours as needed. 02/08/14   Quentin Angst, MD     Objective:   Filed Vitals:   02/08/14 0948  BP: 150/90  Pulse: 69  Temp: 98.6 F (37 C)  Resp: 18  Height:  (1.753 m)  Weight: 365 lb (165.563 kg)  SpO2: 94%    Exam General appearance : Awake, alert, not in any distress. Speech Clear. Not toxic looking, morbidly obese HEENT: Atraumatic and Normocephalic, pupils equally reactive to light and accomodation Neck: supple, no JVD. No cervical lymphadenopathy.  Chest: Bilateral gynecomastia, Good air entry bilaterally, no added sounds  CVS: S1 S2 regular, no murmurs.  Abdomen: Bowel sounds  present, Non tender and not distended with no gaurding, rigidity or rebound. Extremities: B/L Lower Ext shows no edema, both legs are warm to touch Neurology: Awake alert, and oriented X 3, CN II-XII intact, Non focal Skin:No Rash Wounds: Right leg with nonhealing ulcer, no evidence of infection, no foul smell or pus discharge, marked discoloration with hypopigmentation of the right lower limb, no edema, very thin skin, rubbery  Data Review Lab Results  Component Value Date   HGBA1C 5.5 05/20/2013     Assessment & Plan    1. Prediabetes  - Glucose (CBG) - HgB A1c  2. Venous ulcer, limited to breakdown of skin Prescribed - doxycycline (VIBRA-TABS) 100 MG tablet; Take 1 tablet (100 mg total) by mouth 2 (two) times daily.  Dispense: 20 tablet; Refill: 0 - neomycin-bacitracin-polymyxin (NEOSPORIN) OINT; Apply 1 application topically 2 (two) times daily.  Dispense: 56 g; Refill: 3 - traMADol (ULTRAM) 50 MG tablet; Take 1 tablet (50 mg total) by mouth every 6 (six) hours as needed.  Dispense: 60 tablet; Refill: 0  - Ambulatory referral to Vascular Surgery, patient may need skin grafting down the road.  3. Essential hypertension Refill - losartan-hydrochlorothiazide (HYZAAR) 50-12.5 MG per tablet; Take 1 tablet by mouth daily.  Dispense: 90 tablet; Refill: 3  Patient was counseled extensively about nutrition and exercise     Return in about 3 months (around 05/10/2014), or if symptoms worsen or fail to improve, for Follow up HTN, Follow up Pain and comorbidities.  The patient was given clear instructions to go to ER or return to medical center if symptoms don't improve, worsen or new problems develop. The patient verbalized understanding. The patient was told to call to get lab results if they haven't heard anything in the next week.   This note has been created with Education officer, environmentalDragon speech recognition software and smart phrase technology. Any transcriptional errors are unintentional.    Jeanann LewandowskyJEGEDE, Mende Biswell, MD, MHA, FACP, FAAP [facility] [place], [place] [phone]   02/08/2014, 10:36 AM

## 2014-02-08 NOTE — Patient Instructions (Signed)
Lymphedema Lymphedema is a swelling caused by the abnormal collection of lymph under the skin. The lymph is fluid from the tissues in your body that travels in the lymphatic system. This system is part of the immune system that includes lymph nodes and vessels. The lymph vessels collect and carry the excess fluid, fats, proteins, and wastes from the tissues of the body to the bloodstream. This system also works to clean and remove bacteria and waste products from the body.  Lymphedema occurs when the lymphatic system is blocked. When the lymph vessels or lymph nodes are blocked or damaged, lymph does not drain properly. This causes abnormal build up of lymph. This leads to swelling in the arms or legs. Lymphedema cannot be cured by medicines. But the swelling can be reduced by physical methods. CAUSES  There are two types of lymphedema. Primary lymphedema is caused by the absence or abnormality of the lymph vessel at birth. It is also known as inherited lymphedema, which occurs rarely. Secondary or acquired lymphedema occurs when the lymph vessel is damaged or blocked. The causes of lymph vessel blockage are:   Skin infection like cellulites.  Infection by parasites (filariasis).  Injury.  Cancer.  Radiation therapy.  Formation of scar tissue.  Surgery. SYMPTOMS  The symptoms of lymphedema are:  Abnormal swelling of the arm or leg.  Heavy or tight feeling in your arm or leg.  Tight-fitting shoes or rings.  Redness of skin over the affected area.  Limited movement of the affected limb.  Some patients complain about sensitivity to touch and discomfort in the limb(s) affected. You may not have these symptoms immediately following injury. They usually appear within a few days or even years after injury. Inform your caregiver, if you have any of these symptoms. Early treatment can avoid further problems.  DIAGNOSIS  First, your caregiver will inquire about any surgery you have had or  medicines you are taking. He will then examine you. Your caregiver may order special imaging tests, such as:  Lymphoscintigraphy (a test in which a low dose of radioactive substance is injected to trace the flow of lymph through the lymph vessels).  MRI (imaging tests using magnetic fields).  Computed tomography (test using special cross-sectional X-rays).  Duplex ultrasound (test using high-frequency sound waves to show the vessels and the blood flow on a screen).  Lymphangiography (special X-ray taken after injecting a contrast dye into the lymph vessel). It is now rarely done. TREATMENT  Lymphedema can be treated in different ways. Your caregiver will decide the type of treatment depending on the cause. Treatment may include:  Exercise: Special exercises will help fluid move out easily from the affected part. This should be done as per your caregiver's advice.  Manual lymph drainage: Gentle massage of the affected limb makes the fluid to move out more freely.  Compression: Compression stockings or external pump apply pressure over the affected limb. This helps the fluid to move out from the arm or leg. Bandaging can also help to move the fluid out from the affected part. Your caregiver will decide the method that suits you the best.  Medicines: Your caregiver may prescribe antibiotics, if you have infection.  Surgery: Your caregiver may advise surgery for severe lymphedema. It is reserved for special cases when the patient has difficulty moving. Your surgeon may remove excess tissue from the arm or leg. This will help to ease your movement. Physical therapy may have to be continued after surgery. HOME CARE INSTRUCTIONS    The area is very fragile and is predisposed to injury and infection.  Eat a healthy diet.  Exercise regularly as per advice.  Keep the affected area clean and dry.  Use gloves while cooking or gardening.  Protect your skin from cuts.  Use electric razor to  shave the affected area.  Keep affected limb elevated.  Do not wear tight clothes, shoes, or jewelry as it may cause the tissue to be strangled.  Do not use heat pads over the affected area.  Do not sit with cross legs.  Do not walk barefoot.  Do not carry weight on the affected arm.  Avoid having blood pressure checked on the affected limb. SEEK MEDICAL CARE IF:  You continue to have swelling in your limb. SEEK IMMEDIATE MEDICAL CARE IF:   You have high fever.  You have skin rash.  You have chills or sweats.  You have pain or redness.  You have a cut that does not heal. MAKE SURE YOU:   Understand these instructions.  Will watch your condition.  Will get help right away if you are not doing well or get worse. Document Released: 11/11/2006 Document Revised: 01/01/2012 Document Reviewed: 10/17/2008 [employer] Patient Information 2015 [employer]. This information is not intended to replace advice given to you by your health care provider. Make sure you discuss any questions you have with your health care provider.  

## 2014-02-08 NOTE — Progress Notes (Signed)
Follow up on leg wound Patient complains of small white itchy bumps on body Does not remember name of HTN med but says it is not lisinopril that is listed in chart Wants flu, pna, tetanus cbg 133 a1c 5.8 Bp 178/89 pulse 114

## 2014-02-09 ENCOUNTER — Other Ambulatory Visit: Payer: Self-pay | Admitting: *Deleted

## 2014-02-09 DIAGNOSIS — L97919 Non-pressure chronic ulcer of unspecified part of right lower leg with unspecified severity: Principal | ICD-10-CM

## 2014-02-09 DIAGNOSIS — I83019 Varicose veins of right lower extremity with ulcer of unspecified site: Secondary | ICD-10-CM

## 2014-02-17 ENCOUNTER — Encounter: Payer: Self-pay | Admitting: Surgery

## 2014-02-21 ENCOUNTER — Ambulatory Visit: Payer: Self-pay | Admitting: Surgery

## 2014-02-21 ENCOUNTER — Encounter (HOSPITAL_COMMUNITY): Payer: Self-pay

## 2014-03-18 ENCOUNTER — Encounter: Payer: Self-pay | Admitting: Surgery

## 2014-03-21 ENCOUNTER — Ambulatory Visit (HOSPITAL_COMMUNITY)
Admission: RE | Admit: 2014-03-21 | Discharge: 2014-03-21 | Disposition: A | Discharge status: Home / Self Care | Payer: Self-pay | Source: Ambulatory Visit | Attending: Surgery | Admitting: Surgery

## 2014-03-21 ENCOUNTER — Ambulatory Visit (INDEPENDENT_AMBULATORY_CARE_PROVIDER_SITE_OTHER): Payer: Self-pay | Admitting: Surgery

## 2014-03-21 ENCOUNTER — Encounter: Payer: Self-pay | Admitting: Surgery

## 2014-03-21 VITALS — BP 139/90 | HR 105 | Ht 69.0 in | Wt 363.0 lb

## 2014-03-21 DIAGNOSIS — I83011 Varicose veins of right lower extremity with ulcer of thigh: Secondary | ICD-10-CM

## 2014-03-21 DIAGNOSIS — I83018 Varicose veins of right lower extremity with ulcer other part of lower leg: Secondary | ICD-10-CM

## 2014-03-21 DIAGNOSIS — I83013 Varicose veins of right lower extremity with ulcer of ankle: Secondary | ICD-10-CM

## 2014-03-21 DIAGNOSIS — I83015 Varicose veins of right lower extremity with ulcer other part of foot: Secondary | ICD-10-CM

## 2014-03-21 DIAGNOSIS — I83019 Varicose veins of right lower extremity with ulcer of unspecified site: Secondary | ICD-10-CM | POA: Insufficient documentation

## 2014-03-21 DIAGNOSIS — L97919 Non-pressure chronic ulcer of unspecified part of right lower leg with unspecified severity: Secondary | ICD-10-CM

## 2014-03-21 DIAGNOSIS — I83014 Varicose veins of right lower extremity with ulcer of heel and midfoot: Secondary | ICD-10-CM

## 2014-03-21 DIAGNOSIS — I83012 Varicose veins of right lower extremity with ulcer of calf: Secondary | ICD-10-CM

## 2014-03-21 DIAGNOSIS — I890 Lymphedema, not elsewhere classified: Secondary | ICD-10-CM

## 2014-03-21 NOTE — Progress Notes (Signed)
Patient name: Samuel [name] MRN: [id number] DOB: [birth year] Sex: male     Chief Complaint  Patient presents with  . Re-evaluation    Rt LE venous stasis w/ skin breakdown     HISTORY OF PRESENT ILLNESS: The patient is back today for evaluation of a right lower extremity ulcer.  I have not seen him for 2 years.  Previously he had a ulcer on his leg that I was able to heal with a Unna boot.  He has been wearing his compression stockings.  He developed an ulcer which is nearly healed recently.  The patient was able to stop smoking.  He does wear his compression stockings however he has not gotten a new pair and a while  Past Medical History  Diagnosis Date  . Asthma   . Ulcer     venous insufficiency    No past surgical history on file.  History   Social History  . Marital Status: Single    Spouse Name: N/A  . Number of Children: 4  . Years of Education: N/A   Occupational History  . Handyman    Social History Main Topics  . Smoking status: Former Smoker -- 1.00 packs/day for 15 years    Types: Cigarettes    Quit date: 10/28/2012  . Smokeless tobacco: Never Used  . Alcohol Use: 2.4 oz/week    2 Cans of beer, 2 Shots of liquor per week  . Drug Use: No  . Sexual Activity: Not on file   Other Topics Concern  . Not on file   Social History Narrative    Family History  Problem Relation Age of Onset  . Asthma Mother   . Hypertension Mother     Allergies as of 03/21/2014  . (No Known Allergies)    Current Outpatient Prescriptions on File Prior to Visit  Medication Sig Dispense Refill  . albuterol (PROVENTIL HFA;VENTOLIN HFA) 108 (90 BASE) MCG/ACT inhaler Inhale 2 puffs into the lungs every 6 (six) hours as needed for wheezing or shortness of breath. 3 Inhaler 3  . cephALEXin (KEFLEX) 500 MG capsule Take 1 capsule (500 mg total) by mouth 4 (four) times daily. 20 capsule 0  . doxycycline (VIBRA-TABS) 100 MG tablet Take 1 tablet (100 mg total) by mouth 2  (two) times daily. 20 tablet 0  . Fluticasone-Salmeterol (ADVAIR) 100-50 MCG/DOSE AEPB Inhale 1 puff into the lungs 2 (two) times daily. 3 each 3  . ibuprofen (ADVIL,MOTRIN) 200 MG tablet Take 200 mg by mouth every 6 (six) hours as needed for mild pain.    Marland Kitchen. losartan-hydrochlorothiazide (HYZAAR) 50-12.5 MG per tablet Take 1 tablet by mouth daily. 90 tablet 3  . neomycin-bacitracin-polymyxin (NEOSPORIN) OINT Apply 1 application topically 2 (two) times daily. 56 g 3  . terbinafine (LAMISIL AT) 1 % cream Apply 1 application topically 2 (two) times daily. 42 g 6  . traMADol (ULTRAM) 50 MG tablet Take 1 tablet (50 mg total) by mouth every 6 (six) hours as needed. 60 tablet 0   No current facility-administered medications on file prior to visit.     REVIEW OF SYSTEMS: Cardiovascular: No chest pain, chest pressure, palpitations, orthopnea, or dyspnea on exertion. No claudication or rest pain,  No history of DVT or phlebitis. Pulmonary: No productive cough, asthma or wheezing. Neurologic: No weakness, paresthesias, aphasia, or amaurosis. No dizziness. Hematologic: No bleeding problems or clotting disorders. Musculoskeletal: No joint pain or joint swelling. Gastrointestinal: No blood in stool or  hematemesis Genitourinary: No dysuria or hematuria. Psychiatric:: No history of major depression. Integumentary: Nearly healed right ankle ulcer. Constitutional: No fever or chills.  PHYSICAL EXAMINATION:   Vital signs are BP 139/90 mmHg  Pulse 105  Ht  (1.753 m)  Wt 363 lb (164.656 kg)  BMI 53.58 kg/m2  SpO2 94% General: The patient appears their stated age. HEENT:  No gross abnormalities Pulmonary:  Non labored breathing Musculoskeletal: There are no major deformities. Neurologic: No focal weakness or paresthesias are detected, Skin: Nearly healed ulcer. Psychiatric: The patient has normal affect. Cardiovascular: Significant bilateral edema   Diagnostic Studies Venous reflux evaluation  was performed today.  The patient has no evidence of deep or superficial venous insufficiency.  There is no evidence of deep vein thrombosis  Assessment: Recurrent right ankle ulcer secondary to lymphedema Plan: I discussed with the patient the importance of wearing compression stockings.  Ultrasound shows no evidence of venous insufficiency.  I have recommended getting a new pair of compression stockings and also considering increasing the level of compression.  I will also refer him to the lymphedema clinic.  He will follow up on an as-needed basis.  Jorge Ny, M.D. [facility] Office: [phone] Pager:  [phone]

## 2014-06-30 ENCOUNTER — Ambulatory Visit: Payer: Self-pay | Admitting: Internal Medicine

## 2014-07-14 ENCOUNTER — Ambulatory Visit: Payer: Self-pay | Admitting: Internal Medicine

## 2014-07-15 ENCOUNTER — Encounter (HOSPITAL_COMMUNITY): Payer: Self-pay

## 2014-07-15 ENCOUNTER — Emergency Department (HOSPITAL_COMMUNITY)
Admission: EM | Admit: 2014-07-15 | Discharge: 2014-07-15 | Disposition: A | Discharge status: Home / Self Care | Payer: Self-pay | Attending: Emergency Medicine | Admitting: Emergency Medicine

## 2014-07-15 DIAGNOSIS — S61412A Laceration without foreign body of left hand, initial encounter: Secondary | ICD-10-CM | POA: Insufficient documentation

## 2014-07-15 DIAGNOSIS — L97901 Non-pressure chronic ulcer of unspecified part of unspecified lower leg limited to breakdown of skin: Secondary | ICD-10-CM

## 2014-07-15 DIAGNOSIS — Z7951 Long term (current) use of inhaled steroids: Secondary | ICD-10-CM | POA: Insufficient documentation

## 2014-07-15 DIAGNOSIS — Y280XXA Contact with sharp glass, undetermined intent, initial encounter: Secondary | ICD-10-CM | POA: Insufficient documentation

## 2014-07-15 DIAGNOSIS — Y990 Civilian activity done for income or pay: Secondary | ICD-10-CM | POA: Insufficient documentation

## 2014-07-15 DIAGNOSIS — Y9289 Other specified places as the place of occurrence of the external cause: Secondary | ICD-10-CM | POA: Insufficient documentation

## 2014-07-15 DIAGNOSIS — Z87891 Personal history of nicotine dependence: Secondary | ICD-10-CM | POA: Insufficient documentation

## 2014-07-15 DIAGNOSIS — I83009 Varicose veins of unspecified lower extremity with ulcer of unspecified site: Secondary | ICD-10-CM

## 2014-07-15 DIAGNOSIS — Y9389 Activity, other specified: Secondary | ICD-10-CM | POA: Insufficient documentation

## 2014-07-15 DIAGNOSIS — Z79899 Other long term (current) drug therapy: Secondary | ICD-10-CM | POA: Insufficient documentation

## 2014-07-15 DIAGNOSIS — J45909 Unspecified asthma, uncomplicated: Secondary | ICD-10-CM | POA: Insufficient documentation

## 2014-07-15 DIAGNOSIS — Z792 Long term (current) use of antibiotics: Secondary | ICD-10-CM | POA: Insufficient documentation

## 2014-07-15 DIAGNOSIS — Z872 Personal history of diseases of the skin and subcutaneous tissue: Secondary | ICD-10-CM | POA: Insufficient documentation

## 2014-07-15 MED ORDER — LIDOCAINE-EPINEPHRINE (PF) 2 %-1:200000 IJ SOLN
10.0000 mL | Freq: Once | INTRAMUSCULAR | Status: AC
  Administered 2014-07-15: 10 mL via INTRADERMAL
  Filled 2014-07-15: qty 20

## 2014-07-15 MED ORDER — TRAMADOL HCL 50 MG PO TABS: 50.0000 mg | ORAL_TABLET | Freq: Four times a day (QID) | ORAL | Status: DC | PRN

## 2014-07-15 NOTE — Discharge Instructions (Signed)
Please have your sutures removed at urgent care in 1 week.  Take pain medication as needed.  Return if you notice signs of infection.  Laceration Care, Adult A laceration is a cut or lesion that goes through all layers of the skin and into the tissue just beneath the skin. TREATMENT  Some lacerations may not require closure. Some lacerations may not be able to be closed due to an increased risk of infection. It is important to see your caregiver as soon as possible after an injury to minimize the risk of infection and maximize the opportunity for successful closure. If closure is appropriate, pain medicines may be given, if needed. The wound will be cleaned to help prevent infection. Your caregiver will use stitches (sutures), staples, wound glue (adhesive), or skin adhesive strips to repair the laceration. These tools bring the skin edges together to allow for faster healing and a better cosmetic outcome. However, all wounds will heal with a scar. Once the wound has healed, scarring can be minimized by covering the wound with sunscreen during the day for 1 full year. HOME CARE INSTRUCTIONS  For sutures or staples:  Keep the wound clean and dry.  If you were given a bandage (dressing), you should change it at least once a day. Also, change the dressing if it becomes wet or dirty, or as directed by your caregiver.  Wash the wound with soap and water 2 times a day. Rinse the wound off with water to remove all soap. Pat the wound dry with a clean towel.  After cleaning, apply a thin layer of the antibiotic ointment as recommended by your caregiver. This will help prevent infection and keep the dressing from sticking.  You may shower as usual after the first 24 hours. Do not soak the wound in water until the sutures are removed.  Only take over-the-counter or prescription medicines for pain, discomfort, or fever as directed by your caregiver.  Get your sutures or staples removed as directed by your  caregiver. For skin adhesive strips:  Keep the wound clean and dry.  Do not get the skin adhesive strips wet. You may bathe carefully, using caution to keep the wound dry.  If the wound gets wet, pat it dry with a clean towel.  Skin adhesive strips will fall off on their own. You may trim the strips as the wound heals. Do not remove skin adhesive strips that are still stuck to the wound. They will fall off in time. For wound adhesive:  You may briefly wet your wound in the shower or bath. Do not soak or scrub the wound. Do not swim. Avoid periods of heavy perspiration until the skin adhesive has fallen off on its own. After showering or bathing, gently pat the wound dry with a clean towel.  Do not apply liquid medicine, cream medicine, or ointment medicine to your wound while the skin adhesive is in place. This may loosen the film before your wound is healed.  If a dressing is placed over the wound, be careful not to apply tape directly over the skin adhesive. This may cause the adhesive to be pulled off before the wound is healed.  Avoid prolonged exposure to sunlight or tanning lamps while the skin adhesive is in place. Exposure to ultraviolet light in the first year will darken the scar.  The skin adhesive will usually remain in place for 5 to 10 days, then naturally fall off the skin. Do not pick at the adhesive  film. You may need a tetanus shot if:  You cannot remember when you had your last tetanus shot.  You have never had a tetanus shot. If you get a tetanus shot, your arm may swell, get red, and feel warm to the touch. This is common and not a problem. If you need a tetanus shot and you choose not to have one, there is a rare chance of getting tetanus. Sickness from tetanus can be serious. SEEK MEDICAL CARE IF:   You have redness, swelling, or increasing pain in the wound.  You see a red line that goes away from the wound.  You have yellowish-white fluid (pus) coming from  the wound.  You have a fever.  You notice a bad smell coming from the wound or dressing.  Your wound breaks open before or after sutures have been removed.  You notice something coming out of the wound such as wood or glass.  Your wound is on your hand or foot and you cannot move a finger or toe. SEEK IMMEDIATE MEDICAL CARE IF:   Your pain is not controlled with prescribed medicine.  You have severe swelling around the wound causing pain and numbness or a change in color in your arm, hand, leg, or foot.  Your wound splits open and starts bleeding.  You have worsening numbness, weakness, or loss of function of any joint around or beyond the wound.  You develop painful lumps near the wound or on the skin anywhere on your body. MAKE SURE YOU:   Understand these instructions.  Will watch your condition.  Will get help right away if you are not doing well or get worse. Document Released: 01/14/2005 Document Revised: 04/08/2011 Document Reviewed: 07/10/2010 [facility] Patient Information 2015 [place], [place]. This information is not intended to replace advice given to you by your health care provider. Make sure you discuss any questions you have with your health care provider.

## 2014-07-15 NOTE — ED Notes (Signed)
PT reports cutting hand on glass this afternoon. Pressure dressing applied. Tetanus up to date. NAD.

## 2014-07-15 NOTE — ED Provider Notes (Signed)
CSN: [id number]     Arrival date & time 07/15/14  1335 History  This chart was scribed for non-physician practitioner, Fayrene Helper, PA-C, working with Eber Hong, MD by Charline Bills, ED Scribe. This patient was seen in room TR06C/TR06C and the patient's care was started at 2:27 PM.   Chief Complaint  Patient presents with  . Laceration   The history is provided by the patient. No language interpreter was used.   HPI Comments: Samuel [name] is a 44 y.o. male who presents to the Emergency Department complaining of a laceration sustained to the palmar aspect of the left hand approximately 1 hour ago. Pt was pushing a window open at work when the glass shattered. The glass cuts his L hand on the palmar region.  Hecurrently rates his pain as 8/10, sharp, throbbing sensation. Pt denies left wrist or left elbow pain. No medications tried PTA. Pt's last tetanus was earlier this year. No known allergies.   Past Medical History  Diagnosis Date  . Asthma   . Ulcer     venous insufficiency   No past surgical history on file. Family History  Problem Relation Age of Onset  . Asthma Mother   . Hypertension Mother    History  Substance Use Topics  . Smoking status: Former Smoker -- 1.00 packs/day for 15 years    Types: Cigarettes    Quit date: 10/28/2012  . Smokeless tobacco: Never Used  . Alcohol Use: 2.4 oz/week    2 Cans of beer, 2 Shots of liquor per week    Review of Systems  Skin: Positive for wound.  Neurological: Negative for numbness.   Allergies  Review of patient's allergies indicates no known allergies.  Home Medications   Prior to Admission medications   Medication Sig Start Date End Date Taking? Authorizing Provider  albuterol (PROVENTIL HFA;VENTOLIN HFA) 108 (90 BASE) MCG/ACT inhaler Inhale 2 puffs into the lungs every 6 (six) hours as needed for wheezing or shortness of breath. 05/21/13   Quentin Angst, MD  cephALEXin (KEFLEX) 500 MG capsule Take 1 capsule  (500 mg total) by mouth 4 (four) times daily. 09/02/13   Quentin Angst, MD  doxycycline (VIBRA-TABS) 100 MG tablet Take 1 tablet (100 mg total) by mouth 2 (two) times daily. 02/08/14   Quentin Angst, MD  Fluticasone-Salmeterol (ADVAIR) 100-50 MCG/DOSE AEPB Inhale 1 puff into the lungs 2 (two) times daily. 05/21/13   Quentin Angst, MD  ibuprofen (ADVIL,MOTRIN) 200 MG tablet Take 200 mg by mouth every 6 (six) hours as needed for mild pain.    Historical Provider, MD  losartan-hydrochlorothiazide (HYZAAR) 50-12.5 MG per tablet Take 1 tablet by mouth daily. 02/08/14   Quentin Angst, MD  neomycin-bacitracin-polymyxin (NEOSPORIN) OINT Apply 1 application topically 2 (two) times daily. 02/08/14   Quentin Angst, MD  terbinafine (LAMISIL AT) 1 % cream Apply 1 application topically 2 (two) times daily. 02/08/14   Quentin Angst, MD  traMADol (ULTRAM) 50 MG tablet Take 1 tablet (50 mg total) by mouth every 6 (six) hours as needed. 02/08/14   Olugbemiga E Hyman Hopes, MD   BP 148/83 mmHg  Pulse 115  Temp(Src) 98.6 F (37 C) (Oral)  SpO2 93% Physical Exam  Constitutional: He is oriented to person, place, and time. He appears well-developed and well-nourished. No distress.  HENT:  Head: Normocephalic and atraumatic.  Eyes: Conjunctivae and EOM are normal.  Neck: Neck supple. No tracheal deviation present.  Cardiovascular: Normal  rate and intact distal pulses.   Pulmonary/Chest: Effort normal. No respiratory distress.  Musculoskeletal: Normal range of motion.  Neurological: He is alert and oriented to person, place, and time.  Skin: Skin is warm and dry. Laceration noted.  L hand: Pt has a 4 cm v-shape laceration noted to the hypothenar muscle ventrally. Actively bleeding. No foreign object noted.  Psychiatric: He has a normal mood and affect. His behavior is normal.  Nursing note and vitals reviewed.  ED Course  Procedures (including critical care time) DIAGNOSTIC  STUDIES: Oxygen Saturation is 93% on RA, adequate by my interpretation.    COORDINATION OF CARE: 2:31 PM-Discussed treatment plan which includes sutures with pt at bedside and pt agreed to plan.   LACERATION REPAIR PROCEDURE NOTE The patient's identification was confirmed and consent was obtained. This procedure was performed by Fayrene Helper, PA-C at 3:14 PM. Site: palmar aspect of left hand Sterile procedures observed Anesthetic used (type and amt): 2% lidocaine with epinephrine  Suture type/size: 5.0 prolene  Length: 4 cm, 1cm depth # of Sutures: 8 Technique: simple interrupted, sharp debridement and approximation  Complexity: moderate Antibx ointment applied: Bacitracin and pressure dressing Tetanus UTD Site anesthetized, irrigated with NS, explored without evidence of foreign body, wound well approximated, site covered with dry, sterile dressing. Patient tolerated procedure well without complications. Instructions for care discussed verbally and patient provided with additional written instructions for homecare and f/u.  Patient suffered a 4cm deep laceration to his nondominant hand. No tendon or nerve injury.  Suture repaired provided. Outpatient follow-up commended. Sutures to be removed in 7 days. Pain medication prescribed. Return precaution discussed.  Labs Review Labs Reviewed - No data to display  Imaging Review No results found.   EKG Interpretation None      MDM   Final diagnoses:  Laceration of left hand without foreign body, initial encounter    BP 127/87 mmHg  Pulse 109  Temp(Src) 98.5 F (36.9 C) (Oral)  Resp 18  SpO2 98%   I personally performed the services described in this documentation, which was scribed in my presence. The recorded information has been reviewed and is accurate.     Fayrene Helper, PA-C 07/15/14 1543  Eber Hong, MD 07/16/14 [id number]

## 2014-07-25 ENCOUNTER — Other Ambulatory Visit: Payer: Self-pay

## 2014-08-04 ENCOUNTER — Ambulatory Visit: Payer: Self-pay | Admitting: Internal Medicine

## 2016-03-20 ENCOUNTER — Emergency Department (HOSPITAL_COMMUNITY): Payer: Self-pay

## 2016-03-20 ENCOUNTER — Other Ambulatory Visit: Payer: Self-pay

## 2016-03-20 ENCOUNTER — Encounter (HOSPITAL_COMMUNITY): Payer: Self-pay | Admitting: Emergency Medicine

## 2016-03-20 ENCOUNTER — Emergency Department (HOSPITAL_COMMUNITY)
Admission: EM | Admit: 2016-03-20 | Discharge: 2016-03-21 | Disposition: A | Discharge status: Home / Self Care | Payer: Self-pay | Attending: Emergency Medicine | Admitting: Emergency Medicine

## 2016-03-20 DIAGNOSIS — J189 Pneumonia, unspecified organism: Secondary | ICD-10-CM

## 2016-03-20 DIAGNOSIS — J181 Lobar pneumonia, unspecified organism: Secondary | ICD-10-CM | POA: Insufficient documentation

## 2016-03-20 DIAGNOSIS — Z87891 Personal history of nicotine dependence: Secondary | ICD-10-CM | POA: Insufficient documentation

## 2016-03-20 DIAGNOSIS — J441 Chronic obstructive pulmonary disease with (acute) exacerbation: Secondary | ICD-10-CM | POA: Insufficient documentation

## 2016-03-20 DIAGNOSIS — Z9114 Patient's other noncompliance with medication regimen: Secondary | ICD-10-CM | POA: Insufficient documentation

## 2016-03-20 DIAGNOSIS — I10 Essential (primary) hypertension: Secondary | ICD-10-CM | POA: Insufficient documentation

## 2016-03-20 LAB — CBC WITH DIFFERENTIAL/PLATELET
Basophils Absolute: 0 10*3/uL (ref 0.0–0.1)
Basophils Relative: 0 %
EOS ABS: 0.6 10*3/uL (ref 0.0–0.7)
Eosinophils Relative: 5 %
HCT: 42.8 % (ref 39.0–52.0)
Hemoglobin: 14 g/dL (ref 13.0–17.0)
LYMPHS ABS: 3.6 10*3/uL (ref 0.7–4.0)
LYMPHS PCT: 32 %
MCH: 27 pg (ref 26.0–34.0)
MCHC: 32.7 g/dL (ref 30.0–36.0)
MCV: 82.6 fL (ref 78.0–100.0)
MONO ABS: 1.1 10*3/uL — AB (ref 0.1–1.0)
Monocytes Relative: 10 %
Neutro Abs: 5.9 10*3/uL (ref 1.7–7.7)
Neutrophils Relative %: 53 %
PLATELETS: 374 10*3/uL (ref 150–400)
RBC: 5.18 MIL/uL (ref 4.22–5.81)
RDW: 14.6 % (ref 11.5–15.5)
WBC: 11.3 10*3/uL — ABNORMAL HIGH (ref 4.0–10.5)

## 2016-03-20 LAB — BASIC METABOLIC PANEL
Anion gap: 7 (ref 5–15)
BUN: 8 mg/dL (ref 6–20)
CHLORIDE: 103 mmol/L (ref 101–111)
CO2: 27 mmol/L (ref 22–32)
Calcium: 9.1 mg/dL (ref 8.9–10.3)
Creatinine, Ser: 1.05 mg/dL (ref 0.61–1.24)
GFR calc Af Amer: >60 mL/min (ref >60)
GFR calc non Af Amer: >60 mL/min (ref >60)
Glucose, Bld: 97 mg/dL (ref 65–99)
Potassium: 3.9 mmol/L (ref 3.5–5.1)
SODIUM: 137 mmol/L (ref 135–145)

## 2016-03-20 LAB — I-STAT TROPONIN, ED: TROPONIN I, POC: 0 ng/mL (ref 0.00–0.08)

## 2016-03-20 MED ORDER — ALBUTEROL SULFATE (2.5 MG/3ML) 0.083% IN NEBU
INHALATION_SOLUTION | RESPIRATORY_TRACT | Status: AC
  Filled 2016-03-20: qty 6

## 2016-03-20 MED ORDER — PREDNISONE 20 MG PO TABS
60.0000 mg | ORAL_TABLET | Freq: Once | ORAL | Status: AC
  Administered 2016-03-20: 60 mg via ORAL
  Filled 2016-03-20: qty 3

## 2016-03-20 MED ORDER — ALBUTEROL SULFATE (2.5 MG/3ML) 0.083% IN NEBU
5.0000 mg | INHALATION_SOLUTION | Freq: Once | RESPIRATORY_TRACT | Status: AC
  Administered 2016-03-20: 5 mg via RESPIRATORY_TRACT

## 2016-03-20 MED ORDER — ALBUTEROL (5 MG/ML) CONTINUOUS INHALATION SOLN
10.0000 mg/h | INHALATION_SOLUTION | RESPIRATORY_TRACT | Status: AC
  Administered 2016-03-20: 10 mg/h via RESPIRATORY_TRACT
  Filled 2016-03-20: qty 20

## 2016-03-20 MED ORDER — IPRATROPIUM BROMIDE 0.02 % IN SOLN
0.5000 mg | Freq: Once | RESPIRATORY_TRACT | Status: AC
  Administered 2016-03-20: 0.5 mg via RESPIRATORY_TRACT
  Filled 2016-03-20: qty 2.5

## 2016-03-20 MED ORDER — HYDROCHLOROTHIAZIDE 12.5 MG PO CAPS
12.5000 mg | ORAL_CAPSULE | Freq: Every day | ORAL | Status: DC
  Administered 2016-03-20: 12.5 mg via ORAL
  Filled 2016-03-20: qty 1

## 2016-03-20 MED ORDER — ALBUTEROL SULFATE (2.5 MG/3ML) 0.083% IN NEBU
5.0000 mg | INHALATION_SOLUTION | Freq: Once | RESPIRATORY_TRACT | Status: AC
  Administered 2016-03-20: 5 mg via RESPIRATORY_TRACT
  Filled 2016-03-20: qty 6

## 2016-03-20 MED ORDER — LOSARTAN POTASSIUM 50 MG PO TABS
50.0000 mg | ORAL_TABLET | Freq: Once | ORAL | Status: AC
  Administered 2016-03-20: 50 mg via ORAL
  Filled 2016-03-20: qty 1

## 2016-03-20 NOTE — ED Provider Notes (Signed)
[facility]-EMERGENCY DEPT Provider Note   CSN: [id number] Arrival date & time: 03/20/16  2041     History   Chief Complaint Chief Complaint  Patient presents with  . Shortness of Breath    [name] is a 46 y.o. male.  The history is provided by the patient and medical records.   [age] y.o. M with hx of HTN, asthma, COPD, presenting to the ED for SOB.  Patient reports he got sick with a cold a few weeks ago which has progressed to now chest congestion.  Reports ongoing productive cough with thick, clear/yellow sputum.  He denies fever or chills.  No chest pain.  No headache, abdominal pain, body aches, N/V/D. States he has been wheezing but is out of his home medications so he did use his moms inhaler.  No known cardiac hx.  Former smoker.   He was given albuterol neb on arrival here which he reports initially helped his symptoms.  Patient is hypertensive here-- states he has been out of his BP medications for over a year now.  No recent follow-up.  He is mildly tachycardic as well, states his resting HR is usually 115 or so.  Denies palpitations.  Past Medical History:  Diagnosis Date  . Asthma   . Ulcer (HCC)    venous insufficiency    Patient Active Problem List   Diagnosis Date Noted  . Prediabetes 02/08/2014  . Venous ulcer, limited to breakdown of skin (HCC) 09/02/2013  . Essential hypertension 09/02/2013  . COPD exacerbation (HCC) 05/20/2013  . OSA (obstructive sleep apnea) 05/20/2013  . Lymphedema 02/02/2013  . Ulcer of lower extremity (HCC) 12/28/2012  . Edema 12/21/2012  . Preventative health care 11/18/2012  . HTN (hypertension) 11/18/2012  . COPD GOLD II 10/24/2012  . Morbid obesity (HCC) 10/24/2012  . Edema leg 10/24/2012    History reviewed. No pertinent surgical history.     Home Medications    Prior to Admission medications   Medication Sig Start Date End Date Taking? Authorizing Provider  albuterol (PROVENTIL HFA;VENTOLIN HFA) 108 (90 BASE)  MCG/ACT inhaler Inhale 2 puffs into the lungs every 6 (six) hours as needed for wheezing or shortness of breath. 05/21/13   Quentin Angstlugbemiga E Jegede, MD  cephALEXin (KEFLEX) 500 MG capsule Take 1 capsule (500 mg total) by mouth 4 (four) times daily. 09/02/13   Quentin Angstlugbemiga E Jegede, MD  doxycycline (VIBRA-TABS) 100 MG tablet Take 1 tablet (100 mg total) by mouth 2 (two) times daily. 02/08/14   Quentin Angstlugbemiga E Jegede, MD  Fluticasone-Salmeterol (ADVAIR) 100-50 MCG/DOSE AEPB Inhale 1 puff into the lungs 2 (two) times daily. 05/21/13   Quentin Angstlugbemiga E Jegede, MD  ibuprofen (ADVIL,MOTRIN) 200 MG tablet Take 200 mg by mouth every 6 (six) hours as needed for mild pain.    Historical Provider, MD  losartan-hydrochlorothiazide (HYZAAR) 50-12.5 MG per tablet Take 1 tablet by mouth daily. 02/08/14   Quentin Angstlugbemiga E Jegede, MD  neomycin-bacitracin-polymyxin (NEOSPORIN) OINT Apply 1 application topically 2 (two) times daily. 02/08/14   Quentin Angstlugbemiga E Jegede, MD  terbinafine (LAMISIL AT) 1 % cream Apply 1 application topically 2 (two) times daily. 02/08/14   Quentin Angstlugbemiga E Jegede, MD  traMADol (ULTRAM) 50 MG tablet Take 1 tablet (50 mg total) by mouth every 6 (six) hours as needed for moderate pain. 07/15/14   Fayrene HelperBowie Tran, PA-C    Family History Family History  Problem Relation Age of Onset  . Asthma Mother   . Hypertension Mother  Social History Social History  Substance Use Topics  . Smoking status: Former Smoker    Packs/day: 1.00    Years: 15.00    Types: Cigarettes    Quit date: 10/28/2012  . Smokeless tobacco: Never Used  . Alcohol use 2.4 oz/week    2 Cans of beer, 2 Shots of liquor per week     Allergies   Patient has no known allergies.   Review of Systems Review of Systems  Respiratory: Positive for cough, shortness of breath and wheezing.   Cardiovascular: Negative for chest pain and palpitations.  All other systems reviewed and are negative.    Physical Exam Updated Vital Signs BP 154/97 (BP  Location: Right Arm)   Pulse (!) 128   Temp 98.4 F (36.9 C) (Oral)   Resp 18   Ht 5\' 10"  (1.778 m)   Wt (!) 164.7 kg   SpO2 99%   BMI 52.09 kg/m   Physical Exam  Constitutional: He is oriented to person, place, and time. He appears well-developed and well-nourished.  Obese  HENT:  Head: Normocephalic and atraumatic.  Mouth/Throat: Oropharynx is clear and moist.  Eyes: Conjunctivae and EOM are normal. Pupils are equal, round, and reactive to light.  Neck: Normal range of motion.  No JVD  Cardiovascular: Regular rhythm and normal heart sounds.  Tachycardia present.   Tachycardic around 113-116 in room during exam  Pulmonary/Chest: Effort normal. No respiratory distress. He has wheezes.  Diffuse expiratory wheezes, able to speak in sentences without issue, O2 sats 96% during fluid conversation  Abdominal: Soft. Bowel sounds are normal.  Musculoskeletal: Normal range of motion.  No peripheral edema  Neurological: He is alert and oriented to person, place, and time.  Skin: Skin is warm and dry.  Psychiatric: He has a normal mood and affect.  Nursing note and vitals reviewed.    ED Treatments / Results  Labs (all labs ordered are listed, but only abnormal results are displayed) Labs Reviewed  CBC WITH DIFFERENTIAL/PLATELET - Abnormal; Notable for the following:       Result Value   WBC 11.3 (*)    Monocytes Absolute 1.1 (*)    All other components within normal limits  BASIC METABOLIC PANEL  I-STAT TROPOININ, ED    EKG  EKG Interpretation None       Radiology Dg Chest 2 View  Result Date: 03/20/2016 CLINICAL DATA:  Initial evaluation for acute shortness of breath, productive cough, congestion. EXAM: CHEST  2 VIEW COMPARISON:  Prior radiograph from 10/24/2012. FINDINGS: Borderline cardiomegaly. Mediastinal silhouette within normal limits. Mild scattered bronchitic changes, similar to prior, and may be chronic in nature related to COPD or possibly reactive airways  disease. Superimposed slightly more confluent opacity within the retrocardiac left lower lobe, which may reflect a small infiltrate. No pulmonary edema or pleural effusion. No pneumothorax. No acute osseous abnormality. IMPRESSION: 1. Small focal opacity within the retrocardiac left lower lobe, suspicious for possible small infiltrate given the history of cough and congestion. 2. Mild scattered bronchitic changes, similar to previous, and may be related to underlying COPD or reactive airways disease. 3. Borderline cardiomegaly. Electronically Signed   By: Rise Mu M.D.   On: 03/20/2016 21:30    Procedures Procedures (including critical care time)  CRITICAL CARE Performed by: Garlon Hatchet   Total critical care time: 35 minutes  Critical care time was exclusive of separately billable procedures and treating other patients.  Critical care was necessary to treat or prevent  imminent or life-threatening deterioration.  Critical care was time spent personally by me on the following activities: development of treatment plan with patient and/or surrogate as well as nursing, discussions with consultants, evaluation of patient's response to treatment, examination of patient, obtaining history from patient or surrogate, ordering and performing treatments and interventions, ordering and review of laboratory studies, ordering and review of radiographic studies, pulse oximetry and re-evaluation of patient's condition.   Medications Ordered in ED Medications  hydrochlorothiazide (MICROZIDE) capsule 12.5 mg (12.5 mg Oral Given 03/20/16 2339)  albuterol (PROVENTIL,VENTOLIN) solution continuous neb (0 mg/hr Nebulization Stopped 03/21/16 0113)  albuterol (PROVENTIL) (2.5 MG/3ML) 0.083% nebulizer solution 5 mg (5 mg Nebulization Given 03/20/16 2055)  predniSONE (DELTASONE) tablet 60 mg (60 mg Oral Given 03/20/16 2226)  albuterol (PROVENTIL) (2.5 MG/3ML) 0.083% nebulizer solution 5 mg (5 mg  Nebulization Given 03/20/16 2228)  ipratropium (ATROVENT) nebulizer solution 0.5 mg (0.5 mg Nebulization Given 03/20/16 2228)  losartan (COZAAR) tablet 50 mg (50 mg Oral Given 03/20/16 2226)     Initial Impression / Assessment and Plan / ED Course  I have reviewed the triage vital signs and the nursing notes.  Pertinent labs & imaging results that were available during my care of the patient were reviewed by me and considered in my medical decision making (see chart for details).  46 year old male here with cough and shortness of breath. Denies chest pain. Patient is afebrile, nontoxic. He does have diffuse wheezes throughout, but is able to speak in full sentences without difficulty. EKG sinus tach. Screening lab work is reassuring, mild leukocytosis. Chest x-ray is concerning for pneumonia.  Patient was treated with individual nebs 2 with oral prednisone but continued wheezing.   Given hour long neb with vast improvement of his symptoms. He states he is feeling significantly better.  BP has improved with dose of his home medications. Patient remains mildly tachycardic, but rate has improved from arrival. Do not suspect ACS, PE, dissection, or acute cardiac event. Likely COPD exacerbation coupled with his new pneumonia.  Will discharge home on azithromycin, prednisone taper, continue albuterol inhaler as needed.  I have refilled his home blood pressure medications as well.  Recommended close follow-up with PCP.  Discussed plan with patient, he acknowledged understanding and agreed with plan of care.  Return precautions given for new or worsening symptoms.  Final Clinical Impressions(s) / ED Diagnoses   Final diagnoses:  Community acquired pneumonia of left lower lobe of lung (HCC)  COPD exacerbation (HCC)  Non compliance w medication regimen    New Prescriptions Discharge Medication List as of 03/21/2016  1:38 AM    START taking these medications   Details  azithromycin (ZITHROMAX) 250 MG  tablet Take 1 tablet (250 mg total) by mouth daily. Take first 2 tablets together, then 1 every day until finished., Starting Thu 03/21/2016, Print    predniSONE (DELTASONE) 20 MG tablet Take 40 mg by mouth daily for 3 days, then 20mg  by mouth daily for 3 days, then 10mg  daily for 3 days, Print         Garlon Hatchet, PA-C 03/21/16 0233    Melene Plan, DO 03/21/16 1349

## 2016-03-20 NOTE — ED Triage Notes (Signed)
Pt states he is been having a cold with congested cough, today he got more SOB, denies any fever, chills or cp.

## 2016-03-21 MED ORDER — ALBUTEROL SULFATE HFA 108 (90 BASE) MCG/ACT IN AERS: 1.0000 | INHALATION_SPRAY | Freq: Four times a day (QID) | RESPIRATORY_TRACT | 0 refills | Status: DC | PRN

## 2016-03-21 MED ORDER — LOSARTAN POTASSIUM-HCTZ 50-12.5 MG PO TABS: 1.0000 | ORAL_TABLET | Freq: Every day | ORAL | 0 refills | Status: DC

## 2016-03-21 MED ORDER — AZITHROMYCIN 250 MG PO TABS: 250.0000 mg | ORAL_TABLET | Freq: Every day | ORAL | 0 refills | Status: DC

## 2016-03-21 MED ORDER — PREDNISONE 20 MG PO TABS
ORAL_TABLET | ORAL | 0 refills | Status: DC
Start: 2016-03-21 — End: 2017-03-10

## 2016-03-21 MED FILL — VENTOLIN HFA 90 MCG INHALER: 108 (90 BAS | 30 days supply | Qty: 18 | Fill #0

## 2016-03-21 MED FILL — ?PREDNISONE 20 MG TABLET: 20 | 9 days supply | Qty: 12 | Fill #0

## 2016-03-21 MED FILL — AZITHROMYCIN 250 MG TABLET: 250 | 5 days supply | Qty: 6 | Fill #0

## 2016-03-21 MED FILL — LOSARTAN-HCTZ 50-12.5 MG TA: 50-12.5 | 30 days supply | Qty: 30 | Fill #0

## 2016-03-21 NOTE — Discharge Instructions (Signed)
Take the prescribed medication as directed.  Use inhaler as needed for shortness of breath/wheezing. Follow-up with your primary care doctor. Call tomorrow to make an appointment. Return to the ED for new or worsening symptoms.

## 2016-04-01 ENCOUNTER — Encounter: Payer: Self-pay | Admitting: Internal Medicine

## 2016-04-01 ENCOUNTER — Ambulatory Visit: Payer: Self-pay | Attending: Internal Medicine | Admitting: Internal Medicine

## 2016-04-01 VITALS — BP 170/100 | HR 100 | Temp 97.9°F | Resp 16 | Wt 365.8 lb

## 2016-04-01 DIAGNOSIS — J189 Pneumonia, unspecified organism: Secondary | ICD-10-CM

## 2016-04-01 DIAGNOSIS — R634 Abnormal weight loss: Secondary | ICD-10-CM | POA: Insufficient documentation

## 2016-04-01 DIAGNOSIS — J440 Chronic obstructive pulmonary disease with acute lower respiratory infection: Secondary | ICD-10-CM | POA: Insufficient documentation

## 2016-04-01 DIAGNOSIS — Z87891 Personal history of nicotine dependence: Secondary | ICD-10-CM | POA: Insufficient documentation

## 2016-04-01 DIAGNOSIS — I10 Essential (primary) hypertension: Secondary | ICD-10-CM

## 2016-04-01 DIAGNOSIS — J449 Chronic obstructive pulmonary disease, unspecified: Secondary | ICD-10-CM

## 2016-04-01 DIAGNOSIS — Z825 Family history of asthma and other chronic lower respiratory diseases: Secondary | ICD-10-CM | POA: Insufficient documentation

## 2016-04-01 DIAGNOSIS — Z6841 Body Mass Index (BMI) 40.0 and over, adult: Secondary | ICD-10-CM | POA: Insufficient documentation

## 2016-04-01 DIAGNOSIS — Z8249 Family history of ischemic heart disease and other diseases of the circulatory system: Secondary | ICD-10-CM | POA: Insufficient documentation

## 2016-04-01 DIAGNOSIS — E079 Disorder of thyroid, unspecified: Secondary | ICD-10-CM | POA: Insufficient documentation

## 2016-04-01 DIAGNOSIS — Z1329 Encounter for screening for other suspected endocrine disorder: Secondary | ICD-10-CM

## 2016-04-01 DIAGNOSIS — Z125 Encounter for screening for malignant neoplasm of prostate: Secondary | ICD-10-CM

## 2016-04-01 DIAGNOSIS — Z114 Encounter for screening for human immunodeficiency virus [HIV]: Secondary | ICD-10-CM

## 2016-04-01 DIAGNOSIS — G4733 Obstructive sleep apnea (adult) (pediatric): Secondary | ICD-10-CM

## 2016-04-01 DIAGNOSIS — R7303 Prediabetes: Secondary | ICD-10-CM

## 2016-04-01 DIAGNOSIS — L97819 Non-pressure chronic ulcer of other part of right lower leg with unspecified severity: Secondary | ICD-10-CM | POA: Insufficient documentation

## 2016-04-01 DIAGNOSIS — I872 Venous insufficiency (chronic) (peripheral): Secondary | ICD-10-CM | POA: Insufficient documentation

## 2016-04-01 LAB — PSA: PSA: 0.4 ng/mL (ref <=4.0)

## 2016-04-01 LAB — GLUCOSE, POCT (MANUAL RESULT ENTRY): POC Glucose: 96 mg/dl (ref 70–99)

## 2016-04-01 LAB — POCT GLYCOSYLATED HEMOGLOBIN (HGB A1C): Hemoglobin A1C: 5.6

## 2016-04-01 LAB — TSH: TSH: 0.74 m[IU]/L (ref 0.40–4.50)

## 2016-04-01 MED ORDER — IPRATROPIUM-ALBUTEROL 0.5-2.5 (3) MG/3ML IN SOLN: 3.0000 mL | Freq: Four times a day (QID) | RESPIRATORY_TRACT | 2 refills | Status: DC | PRN

## 2016-04-01 MED ORDER — METFORMIN HCL 500 MG PO TABS: 500.0000 mg | ORAL_TABLET | Freq: Every day | ORAL | 3 refills | Status: DC

## 2016-04-01 MED ORDER — ALBUTEROL SULFATE HFA 108 (90 BASE) MCG/ACT IN AERS: 1.0000 | INHALATION_SPRAY | Freq: Four times a day (QID) | RESPIRATORY_TRACT | 3 refills | Status: DC | PRN

## 2016-04-01 MED ORDER — LOSARTAN POTASSIUM-HCTZ 100-25 MG PO TABS: 1.0000 | ORAL_TABLET | Freq: Every day | ORAL | 3 refills | Status: DC

## 2016-04-01 MED ORDER — FLUTICASONE-SALMETEROL 100-50 MCG/DOSE IN AEPB: 1.0000 | INHALATION_SPRAY | Freq: Two times a day (BID) | RESPIRATORY_TRACT | 3 refills | Status: DC

## 2016-04-01 MED FILL — !ADVAIR 100/50 DISKUS: 100-50 | 30 days supply | Qty: 60 | Fill #0

## 2016-04-01 MED FILL — metFORMIN HCL 500 MG TABS: 500 | 30 days supply | Qty: 30 | Fill #0

## 2016-04-01 MED FILL — LOSARTAN-HCTZ 100-25 MG TAB: 100-25 | 30 days supply | Qty: 30 | Fill #0

## 2016-04-01 MED FILL — IPRAT-ALBUT 0.5-3(2.5) MG/3: 0.5-2.5 (3) | 15 days supply | Qty: 180 | Fill #0

## 2016-04-01 NOTE — Progress Notes (Addendum)
Samuel [name], is a 46 y.o. male  ZOX:[id number]  [id number]  DOB - [birth year]  CC:  Chief Complaint  Patient presents with  . Hospitalization Follow-up       HPI: Samuel [name] is a 46 y.o. male here today to establish medical care, w/ significant pmhx of htn, untreated OSA (has not had cpap titration study yet, since lost to f/u), morbid obesity.  Last seen in clinic 1/16.  Recent ED visist 03/20/16 and dx w/ AECOPD and LLL pna, treated w/ prednisone and azithromycin. Denies cough/f/c, feels much better overall. Wants to go back to work, works as Runner, broadcasting/film/videohome remoderler.     Denies tob x 3years, but around a lot of 2nd hand smoke. Rare etoh.  Does not sleep well at night, toss/turns.  Ran out of his inhalers. Took his bp med this am prior to arrival.  Healing rle leg wound.  Patient has No headache, No chest pain, No abdominal pain - No Nausea, No new weakness tingling or numbness, No Cough - SOB.    Review of Systems: Per hpi, o/w all systems reviewed and negative.    No Known Allergies Past Medical History:  Diagnosis Date  . Asthma   . Ulcer (HCC)    venous insufficiency   Current Outpatient Prescriptions on File Prior to Visit  Medication Sig Dispense Refill  . azithromycin (ZITHROMAX) 250 MG tablet Take 1 tablet (250 mg total) by mouth daily. Take first 2 tablets together, then 1 every day until finished. (Patient not taking: Reported on 04/01/2016) 6 tablet 0  . cephALEXin (KEFLEX) 500 MG capsule Take 1 capsule (500 mg total) by mouth 4 (four) times daily. (Patient not taking: Reported on 03/20/2016) 20 capsule 0  . dextromethorphan-guaiFENesin (MUCINEX DM) 30-600 MG 12hr tablet Take 1-2 tablets by mouth 2 (two) times daily as needed for cough (and congestion).     Marland Kitchen. doxycycline (VIBRA-TABS) 100 MG tablet Take 1 tablet (100 mg total) by mouth 2 (two) times daily. (Patient not taking: Reported on 03/20/2016) 20 tablet 0  . neomycin-bacitracin-polymyxin (NEOSPORIN)  OINT Apply 1 application topically 2 (two) times daily. (Patient not taking: Reported on 03/20/2016) 56 g 3  . predniSONE (DELTASONE) 20 MG tablet Take 40 mg by mouth daily for 3 days, then 20mg  by mouth daily for 3 days, then 10mg  daily for 3 days (Patient not taking: Reported on 04/01/2016) 12 tablet 0  . terbinafine (LAMISIL AT) 1 % cream Apply 1 application topically 2 (two) times daily. (Patient not taking: Reported on 03/20/2016) 42 g 6  . traMADol (ULTRAM) 50 MG tablet Take 1 tablet (50 mg total) by mouth every 6 (six) hours as needed for moderate pain. (Patient not taking: Reported on 03/20/2016) 15 tablet 0   No current facility-administered medications on file prior to visit.    Family History  Problem Relation Age of Onset  . Asthma Mother   . Hypertension Mother    Social History   Social History  . Marital status: Single    Spouse name: N/A  . Number of children: 4  . Years of education: N/A   Occupational History  . Handyman Merchandiser, [employer]   Social History Main Topics  . Smoking status: Former Smoker    Packs/day: 1.00    Years: 15.00    Types: Cigarettes    Quit date: 10/28/2012  . Smokeless tobacco: Never Used  . Alcohol use 2.4 oz/week    2 Cans of beer, 2 Shots of liquor per  week  . Drug use: No  . Sexual activity: Not on file   Other Topics Concern  . Not on file   Social History Narrative  . No narrative on file    Objective:   Vitals:   04/01/16 0943  BP: (!) 170/100  Pulse: 100  Resp: 16  Temp: 97.9 F (36.6 C)    Filed Weights   04/01/16 0943  Weight: (!) 365 lb 12.8 oz (165.9 kg)    BP Readings from Last 3 Encounters:  04/01/16 (!) 170/100  03/21/16 172/99  07/15/14 127/87    Physical Exam: Constitutional: Patient appears well-developed and well-nourished. No distress. AAOx3, morbid obese, pleasant. HENT: Normocephalic, atraumatic, External right and left ear normal. Oropharynx is clear and moist. bilat TMs clear. Eyes:  Conjunctivae and EOM are normal. PERRL, no scleral icterus. Neck: Normal ROM. Neck supple. No JVD. No tracheal deviation. No thyromegaly. CVS: RRR, S1/S2 +, no murmurs, no gallops, no carotid bruit.  Pulmonary: Effort and breath sounds normal, no stridor, rhonchi, wheezes, rales.  Abdominal: Soft. BS +, no distension, tenderness, rebound or guarding.  Musculoskeletal: Normal range of motion. No edema and no tenderness.  LE: bilat/ no c/c, trace bilat eedema, pulses 1+ bilateral.  Signs of PVD on rle w/ dark pigmentation, with healed rle leg wound.  Neuro: Alert.  muscle tone coordination wnl. No cranial nerve deficit grossly. Skin: Skin is warm and dry. No rash noted. Not diaphoretic. No erythema. No pallor. Psychiatric: Normal mood and affect. Behavior, judgment, thought content normal.  Lab Results  Component Value Date   WBC 11.3 (H) 03/20/2016   HGB 14.0 03/20/2016   HCT 42.8 03/20/2016   MCV 82.6 03/20/2016   PLT 374 03/20/2016   Lab Results  Component Value Date   CREATININE 1.05 03/20/2016   BUN 8 03/20/2016   NA 137 03/20/2016   K 3.9 03/20/2016   CL 103 03/20/2016   CO2 27 03/20/2016    Lab Results  Component Value Date   HGBA1C 5.6 04/01/2016   Lipid Panel     Component Value Date/Time   CHOL 163 05/20/2013 1120   TRIG 89 05/20/2013 1120   HDL 37 (L) 05/20/2013 1120   CHOLHDL 4.4 05/20/2013 1120   VLDL 18 05/20/2013 1120   LDLCALC 108 (H) 05/20/2013 1120        Depression screen PHQ 2/9 04/01/2016  Decreased Interest 0  Down, Depressed, Hopeless 0  PHQ - 2 Score 0   03/20/16 cxr IMPRESSION: 1. Small focal opacity within the retrocardiac left lower lobe, suspicious for possible small infiltrate given the history of cough and congestion. 2. Mild scattered bronchitic changes, similar to previous, and may be related to underlying COPD or reactive airways disease. 3. Borderline cardiomegaly.   Electronically Signed   By: Rise Mu M.D.    On: 03/20/2016 21:30 Assessment and plan:   1. HTN (hypertension), malignant, uncontrolled  -- denies cp/ha/sob currently - increase hyzaar to 100/25 qd, low salt diet discussed, avoid fast food advised - fu rn Travia 2 wks for bp check, if sbp >130, add norvasc 5 qd, and fu in 2 wks w/ me.  2. Prediabetes - discuss low carb diet w/ pt, info provided - start metformin 500qd, Metformin helps your body process sugars better, also helps w/ some weight loss as well.   But can cause some indigestion/n/diarrhea, but sx usually resolved in 2 wks or so. - POCT glucose (manual entry) 96 - POCT glycosylated hemoglobin (Hb  A1C) 5.6  3. aecopd resolved - renewed albuterol mdi, duonebs, and advair - avoid 2nd hand smoke recd.  4. Encounter for screening for HIV - HIV antibody (with reflex)  5. Pneumonia of left lung due to infectious organism, unspecified part of lung Treated, return to work note written today, asx today.  - DG Chest 2 View; Future  - in 2wks to eval resolution.  6. Prostate cancer screening - PSA  7. Thyroid disorder screen - TSH  8. OSA (obstructive sleep apnea) Untreated, needs cpap titration, unfunded currently. Advised financial aid  9. Financial aid  Return in about 3 months (around 07/02/2016), or if symptoms worsen or fail to improve.  The patient was given clear instructions to go to ER or return to medical center if symptoms don't improve, worsen or new problems develop. The patient verbalized understanding. The patient was told to call to get lab results if they haven't heard anything in the next week.    This note has been created with Education officer, environmental. Any transcriptional errors are unintentional.   Pete Glatter, MD, MBA/MHA [facility] [place], [place] [phone]   04/01/2016, 10:24 AM

## 2016-04-01 NOTE — Patient Instructions (Addendum)
** 2wks followup w/ RN Eustace PenRavia for bp check - [facility] weeks [facility] for follow- up chest xray  ** - financial aid  QUICK START PATIENT GUIDE TO LCHF/IF LOW CARB HIGH FAT / INTERMITTENT FASTING  Recommend: <50 gram carbohydrate a day for weightloss.  What is this diet and how does it work? o Insulin is a hormone made by your body that allows you to use sugar (glucose) from carbohydrates in the food you eat for energy or to store glucose (as fat) for future use  o Insulin levels need to be lowered in order to utilize our stored energy (fat) o Many struggling with obesity are insulin resistant and have high levels of insulin o This diet works to lower your insulin in two ways o Fasting - allows your insulin levels to naturally decrease  o Avoiding carbohydrates - carbs trigger increase in insulin Low Carb Healthy Fat (LCHF) o Get a free app for your phone, such as MyFitnessPal, to help you track your macronutrients (carbs/protein/fats) and to track your weight and body measurements to see your progress o Set your goal for around 10% carbs/20% protein/70% fat o A good starting goal for amount of net carbs per day is 50 grams (some will aim for 20 grams) o "Net carbs" refers to total grams of carbs minus grams of fiber (as fiber is not typically absorbed). For example, if a food has 5g total carb and 3g fiber, that would be 2g net carbs o Increase healthy fats - eg. olive oil, eggs, nuts, avocado, cheese, butter, coconut, meats, fish o Avoid high carb foods - eg. bread, pasta, potatoes, rice, cookies, soda, juice, anything sugary o Buy full-fat ingredients (avoid low-fat versions, which often have more sugar) o No need to count calories, but pay close attention to grams of carbs on labels Intermittent Fasting (IF) o "Fasting" is going a period of time without eating - it helps to stay busy and well-hydrated o Purpose of fasting is to allow insulin levels to drop as low as  possible, allowing your body to switch into fat-burning mode o With this diet there are many approaches to fasting, but 16:8 and 24hr fasts are commonly used o 16:8 fast, usually 5-7 days a week - Fasting for 16 hours of the day, then eating all meals for the day over course of 8 hours. o 24 hour fast, usually 1-3 days a week - Typically eating one meal a day, then fasting until the next day. Plenty of fluids (and some salt to help you hold onto fluids) are recommended during longer fasts.  o During fasts certain beverages are still acceptable - water, sparkling water, bone broth, black tea or coffee, or tea/coffee with small amount of heavy whipping cream Special note for those on diabetic medications o Discuss your medications with your physician. You may need to hold your medication or adjust to only taking when eating. Diabetics should keep close track of their blood sugars when making any changes to diet/meds, to ensure they are staying within normal limits For more info about LCHF/IF o Watch "Therapeutic Fasting - Solving the Two-Compartment Problem" video by Dr. Wylene SimmerJason Fung on YouTube (SatelliteSeeker.[email or web address]://www.[email or web address]/[email or web address]) for a great intro to these concepts o Read "The Obesity Code" and/or "The Complete Guide to Fasting" by Dr. Wylene SimmerJason Fung o Go to www.dietdoctor.com for explanations, recipes, and infographics about foods to eat/avoid o Get a Free smartphone app that helps count carbohydrates  - ie MyKeto EXAMPLES TO  GET STARTED Fasting Beverages -water (can add  tsp Pink Himalayan salt once or twice a day to help stay hydrated for longer fasts) -Sparkling water (such as [employer] or similar; avoid any with artificial sweeteners)  -Bone broth (multiple recipes available online or can buy pre-made) -Tea or Coffee (Adding heavy whipping cream or coconut oil to your tea or coffee can be helpful if you find yourself getting too hungry during the fasts. Can also add cinnamon for flavor. Or  "bulletproof coffee.") Low Carb Healthy Fat Breakfast (if not fasting) -eggs in butter or olive oil with avocado -omelet with veggies and cheese  Lunch -hamburger with cheese and avocado wrapped in lettuce (no bun, no ketchup) -meat and cheese wrapped in lettuce (can dip in mustard or olive oil/vinegar/mayo) -salad with meat/cheese/nuts and higher fat dressing (vinaigrette or Ranch, etc) -tuna salad lettuce wrap -taco meat with cheese, sour cream, guacamole, cheese over lettuce  Dinner -steak with herb butter or Barnaise sauce -"Fathead" pizza (uses cheese and almond flour for the dough - several recipes available online) -roasted or grilled chicken with skin on, with low carb sauce (buffalo, garlic butter, alfredo, pesto, etc) -baked salmon with lemon butter -chicken alfredo with zucchini noodles -[place] butter chicken with low carb garlic naan -egg roll in a bowl  Side Dishes -mashed cauliflower (homemade or available in freezer section) -roast vegetables (green veggies that grow above ground rather than root veggies) with butter or cheese -Caprese salad (fresh mozzarella, tomato and basil with olive oil) -homemade low-carb coleslaw Snacks/Desserts (try to avoid unnecessary snacking and sweets in general) -celery or cucumber dipped in guacamole or sour cream dip -cheese and meat slices  -raspberries with whipped cream (can make homemade with no sugar added) -low carb [place] butter cake  AVOID - sugar, diet/regular soda, potatoes, breads, rice, pasta, candy, cookies, cakes, muffins, juice, high carb fruit (bananas, grapes), beer, ketchup, barbeque and other sweet sauces  -   Low-Sodium Eating Plan Sodium, which is an element that makes up salt, helps you maintain a healthy balance of fluids in your body. Too much sodium can increase your blood pressure and cause fluid and waste to be held in your body. Your health care provider or dietitian may recommend following this  plan if you have high blood pressure (hypertension), kidney disease, liver disease, or heart failure. Eating less sodium can help lower your blood pressure, reduce swelling, and protect your heart, liver, and kidneys. What are tips for following this plan? General guidelines   Most people on this plan should limit their sodium intake to 1,500-2,000 mg (milligrams) of sodium each day. Reading food labels   The Nutrition Facts label lists the amount of sodium in one serving of the food. If you eat more than one serving, you must multiply the listed amount of sodium by the number of servings.  Choose foods with less than 140 mg of sodium per serving.  Avoid foods with 300 mg of sodium or more per serving. Shopping   Look for lower-sodium products, often labeled as "low-sodium" or "no salt added."  Always check the sodium content even if foods are labeled as "unsalted" or "no salt added".  Buy fresh foods.  Avoid canned foods and premade or frozen meals.  Avoid canned, cured, or processed meats  Buy breads that have less than 80 mg of sodium per slice. Cooking   Eat more home-cooked food and less restaurant, buffet, and fast food.  Avoid adding salt when cooking. Use  salt-free seasonings or herbs instead of table salt or sea salt. Check with your health care provider or pharmacist before using salt substitutes.  Cook with plant-based oils, such as canola, sunflower, or olive oil. Meal planning   When eating at a restaurant, ask that your food be prepared with less salt or no salt, if possible.  Avoid foods that contain MSG (monosodium glutamate). MSG is sometimes added to [place] food, bouillon, and some canned foods. What foods are recommended? The items listed may not be a complete list. Talk with your dietitian about what dietary choices are best for you. Grains  Low-sodium cereals, including oats, puffed wheat and rice, and shredded wheat. Low-sodium crackers. Unsalted rice.  Unsalted pasta. Low-sodium bread. Whole-grain breads and whole-grain pasta. Vegetables  Fresh or frozen vegetables. "No salt added" canned vegetables. "No salt added" tomato sauce and paste. Low-sodium or reduced-sodium tomato and vegetable juice. Fruits  Fresh, frozen, or canned fruit. Fruit juice. Meats and other protein foods  Fresh or frozen (no salt added) meat, poultry, seafood, and fish. Low-sodium canned tuna and salmon. Unsalted nuts. Dried peas, beans, and lentils without added salt. Unsalted canned beans. Eggs. Unsalted nut butters. Dairy  Milk. Soy milk. Cheese that is naturally low in sodium, such as ricotta cheese, fresh mozzarella, or Swiss cheese Low-sodium or reduced-sodium cheese. Cream cheese. Yogurt. Fats and oils  Unsalted butter. Unsalted margarine with no trans fat. Vegetable oils such as canola or olive oils. Seasonings and other foods  Fresh and dried herbs and spices. Salt-free seasonings. Low-sodium mustard and ketchup. Sodium-free salad dressing. Sodium-free light mayonnaise. Fresh or refrigerated horseradish. Lemon juice. Vinegar. Homemade, reduced-sodium, or low-sodium soups. Unsalted popcorn and pretzels. Low-salt or salt-free chips. What foods are not recommended? The items listed may not be a complete list. Talk with your dietitian about what dietary choices are best for you. Grains  Instant hot cereals. Bread stuffing, pancake, and biscuit mixes. Croutons. Seasoned rice or pasta mixes. Noodle soup cups. Boxed or frozen macaroni and cheese. Regular salted crackers. Self-rising flour. Vegetables  Sauerkraut, pickled vegetables, and relishes. Olives. [place] fries. Onion rings. Regular canned vegetables (not low-sodium or reduced-sodium). Regular canned tomato sauce and paste (not low-sodium or reduced-sodium). Regular tomato and vegetable juice (not low-sodium or reduced-sodium). Frozen vegetables in sauces. Meats and other protein foods  Meat or fish that is  salted, canned, smoked, spiced, or pickled. Bacon, ham, sausage, hotdogs, corned beef, chipped beef, packaged lunch meats, salt pork, jerky, pickled herring, anchovies, regular canned tuna, sardines, salted nuts. Dairy  Processed cheese and cheese spreads. Cheese curds. Blue cheese. Feta cheese. String cheese. Regular cottage cheese. Buttermilk. Canned milk. Fats and oils  Salted butter. Regular margarine. Ghee. Bacon fat. Seasonings and other foods  Onion salt, garlic salt, seasoned salt, table salt, and sea salt. Canned and packaged gravies. Worcestershire sauce. Tartar sauce. Barbecue sauce. Teriyaki sauce. Soy sauce, including reduced-sodium. Steak sauce. Fish sauce. Oyster sauce. Cocktail sauce. Horseradish that you find on the shelf. Regular ketchup and mustard. Meat flavorings and tenderizers. Bouillon cubes. Hot sauce and Tabasco sauce. Premade or packaged marinades. Premade or packaged taco seasonings. Relishes. Regular salad dressings. Salsa. Potato and tortilla chips. Corn chips and puffs. Salted popcorn and pretzels. Canned or dried soups. Pizza. Frozen entrees and pot pies. Summary  Eating less sodium can help lower your blood pressure, reduce swelling, and protect your heart, liver, and kidneys.  Most people on this plan should limit their sodium intake to 1,500-2,000 mg (milligrams) of  sodium each day.  Canned, boxed, and frozen foods are high in sodium. Restaurant foods, fast foods, and pizza are also very high in sodium. You also get sodium by adding salt to food.  Try to cook at home, eat more fresh fruits and vegetables, and eat less fast food, canned, processed, or prepared foods. This information is not intended to replace advice given to you by your health care provider. Make sure you discuss any questions you have with your health care provider. Document Released: 07/06/2001 Document Revised: 01/08/2016 Document Reviewed: 01/08/2016 Elsevier Interactive Patient Education   2017 Elsevier Inc.  -   Hypertension Hypertension is another name for high blood pressure. High blood pressure forces your heart to work harder to pump blood. This can cause problems over time. There are two numbers in a blood pressure reading. There is a top number (systolic) over a bottom number (diastolic). It is best to have a blood pressure below 120/80. Healthy choices can help lower your blood pressure. You may need medicine to help lower your blood pressure if:  Your blood pressure cannot be lowered with healthy choices.  Your blood pressure is higher than 130/80. Follow these instructions at home: Eating and drinking   If directed, follow the DASH eating plan. This diet includes:  Filling half of your plate at each meal with fruits and vegetables.  Filling one quarter of your plate at each meal with whole grains. Whole grains include whole wheat pasta, brown rice, and whole grain bread.  Eating or drinking low-fat dairy products, such as skim milk or low-fat yogurt.  Filling one quarter of your plate at each meal with low-fat (lean) proteins. Low-fat proteins include fish, skinless chicken, eggs, beans, and tofu.  Avoiding fatty meat, cured and processed meat, or chicken with skin.  Avoiding premade or processed food.  Eat less than 1,500 mg of salt (sodium) a day.  Limit alcohol use to no more than 1 drink a day for nonpregnant women and 2 drinks a day for men. One drink equals 12 oz of beer, 5 oz of wine, or 1 oz of hard liquor. Lifestyle   Work with your doctor to stay at a healthy weight or to lose weight. Ask your doctor what the best weight is for you.  Get at least 30 minutes of exercise that causes your heart to beat faster (aerobic exercise) most days of the week. This may include walking, swimming, or biking.  Get at least 30 minutes of exercise that strengthens your muscles (resistance exercise) at least 3 days a week. This may include lifting weights or  pilates.  Do not use any products that contain nicotine or tobacco. This includes cigarettes and e-cigarettes. If you need help quitting, ask your doctor.  Check your blood pressure at home as told by your doctor.  Keep all follow-up visits as told by your doctor. This is important. Medicines   Take over-the-counter and prescription medicines only as told by your doctor. Follow directions carefully.  Do not skip doses of blood pressure medicine. The medicine does not work as well if you skip doses. Skipping doses also puts you at risk for problems.  Ask your doctor about side effects or reactions to medicines that you should watch for. Contact a doctor if:  You think you are having a reaction to the medicine you are taking.  You have headaches that keep coming back (recurring).  You feel dizzy.  You have swelling in your ankles.  You  have trouble with your vision. Get help right away if:  You get a very bad headache.  You start to feel confused.  You feel weak or numb.  You feel faint.  You get very bad pain in your:  Chest.  Belly (abdomen).  You throw up (vomit) more than once.  You have trouble breathing. Summary  Hypertension is another name for high blood pressure.  Making healthy choices can help lower blood pressure. If your blood pressure cannot be controlled with healthy choices, you may need to take medicine. This information is not intended to replace advice given to you by your health care provider. Make sure you discuss any questions you have with your health care provider. Document Released: 07/03/2007 Document Revised: 12/13/2015 Document Reviewed: 12/13/2015 Elsevier Interactive Patient Education  2017 Elsevier Inc. -   Sleep Apnea Sleep apnea is a condition that affects breathing. People with sleep apnea have moments during sleep when their breathing pauses briefly or gets shallow. Sleep apnea can cause these symptoms:  Trouble staying  asleep.  Sleepiness or tiredness during the day.  Irritability.  Loud snoring.  Morning headaches.  Trouble concentrating.  Forgetting things.  Less interest in sex.  Being sleepy for no reason.  Mood swings.  Personality changes.  Depression.  Waking up a lot during the night to pee (urinate).  Dry mouth.  Sore throat. Follow these instructions at home:  Make any changes in your routine that your doctor recommends.  Eat a healthy, well-balanced diet.  Take over-the-counter and prescription medicines only as told by your doctor.  Avoid using alcohol, calming medicines (sedatives), and narcotic medicines.  Take steps to lose weight if you are overweight.  If you were given a machine (device) to use while you sleep, use it only as told by your doctor.  Do not use any tobacco products, such as cigarettes, chewing tobacco, and e-cigarettes. If you need help quitting, ask your doctor.  Keep all follow-up visits as told by your doctor. This is important. Contact a doctor if:  The machine that you were given to use during sleep is uncomfortable or does not seem to be working.  Your symptoms do not get better.  Your symptoms get worse. Get help right away if:  Your chest hurts.  You have trouble breathing in enough air (shortness of breath).  You have an uncomfortable feeling in your back, arms, or stomach.  You have trouble talking.  One side of your body feels weak.  A part of your face is hanging down (drooping). These symptoms may be an emergency. Do not wait to see if the symptoms will go away. Get medical help right away. Call your local emergency services (911 in the [place] Do not drive yourself to the hospital. This information is not intended to replace advice given to you by your health care provider. Make sure you discuss any questions you have with your health care provider. Document Released: 10/24/2007 Document Revised: 09/10/2015 Document  Reviewed: 10/24/2014 Elsevier Interactive Patient Education  2017 [employer].

## 2016-04-02 LAB — HIV ANTIBODY (ROUTINE TESTING W REFLEX): HIV: NONREACTIVE

## 2016-04-03 ENCOUNTER — Telehealth: Payer: Self-pay

## 2016-04-03 NOTE — Telephone Encounter (Signed)
Contacted pt to go over lab results pt didn't answer lvm asking pt to give me a call at his earliest convenience   If pt calls back please give results: hiv neg, prostate cancer screening neg, thyroid function nml. Remind him to get chest [facility] abt [facility] wks [facility]

## 2016-04-08 ENCOUNTER — Ambulatory Visit: Payer: Self-pay | Attending: Internal Medicine

## 2016-04-15 ENCOUNTER — Other Ambulatory Visit: Payer: Self-pay | Admitting: *Deleted

## 2016-04-15 ENCOUNTER — Ambulatory Visit (HOSPITAL_COMMUNITY)
Admission: RE | Admit: 2016-04-15 | Discharge: 2016-04-15 | Disposition: A | Discharge status: Home / Self Care | Payer: Self-pay | Source: Ambulatory Visit | Attending: Internal Medicine | Admitting: Internal Medicine

## 2016-04-15 DIAGNOSIS — J189 Pneumonia, unspecified organism: Secondary | ICD-10-CM

## 2016-04-15 DIAGNOSIS — J984 Other disorders of lung: Secondary | ICD-10-CM | POA: Insufficient documentation

## 2016-04-15 MED ORDER — ALBUTEROL SULFATE HFA 108 (90 BASE) MCG/ACT IN AERS: 1.0000 | INHALATION_SPRAY | Freq: Four times a day (QID) | RESPIRATORY_TRACT | 3 refills | Status: DC | PRN

## 2016-04-15 MED ORDER — FLUTICASONE-SALMETEROL 100-50 MCG/DOSE IN AEPB: 1.0000 | INHALATION_SPRAY | Freq: Two times a day (BID) | RESPIRATORY_TRACT | 3 refills | Status: DC

## 2016-04-15 NOTE — Telephone Encounter (Signed)
PRINTED FOR PASS PROGRAM 

## 2016-04-25 ENCOUNTER — Telehealth: Payer: Self-pay

## 2016-04-25 NOTE — Telephone Encounter (Signed)
Contacted pt to go over xray results pt didn't answer lvm asking pt to give me a call at his earliest convenience   If pt calls back please give results: chest xray shows resolution of left lower lobe pneumonia. Some scaring noted in the right lung as well.

## 2016-05-06 MED FILL — LOSARTAN-HCTZ 100-25 MG TAB: 100-25 | 30 days supply | Qty: 30 | Fill #1

## 2016-05-06 MED FILL — metFORMIN HCL 500 MG TABS: 500 | 30 days supply | Qty: 30 | Fill #1

## 2016-05-06 MED FILL — $VENTOLIN HFA 18G INHALER: 108 (90 BAS | 30 days supply | Qty: 18 | Fill #0

## 2016-05-06 MED FILL — IPRAT-ALBUT 0.5-3(2.5) MG/3: 0.5-2.5 (3) | 15 days supply | Qty: 180 | Fill #1

## 2016-05-06 MED FILL — $ADVAIR 100/50 MCG INHALER: 100-50 | 30 days supply | Qty: 60 | Fill #1

## 2016-06-05 MED FILL — LOSARTAN-HCTZ 100-25 MG TAB: 100-25 | 30 days supply | Qty: 30 | Fill #2

## 2016-06-05 MED FILL — IPRAT-ALBUT 0.5-3(2.5) MG/3: 0.5-2.5 (3) | 15 days supply | Qty: 180 | Fill #2

## 2016-06-05 MED FILL — ?METFORMIN HCL 500MG TABLET: 500 | 30 days supply | Qty: 30 | Fill #2

## 2016-06-21 MED FILL — $VENTOLIN HFA 18G INHALER: 108 (90 BAS | 25 days supply | Qty: 18 | Fill #0

## 2016-06-21 MED FILL — $ADVAIR 100/50 MCG INHALER: 100-50 | 30 days supply | Qty: 60 | Fill #0

## 2016-07-17 MED FILL — LOSARTAN-HCTZ 100-25 MG TAB: 100-25 | 30 days supply | Qty: 30 | Fill #3

## 2016-07-17 MED FILL — ?METFORMIN HCL 500MG TABLET: 500 | 30 days supply | Qty: 30 | Fill #3

## 2016-07-17 MED FILL — IPRAT-ALBUT 0.5-3(2.5) MG/3: 0.5-2.5 (3) | 15 days supply | Qty: 180 | Fill #3

## 2016-09-03 MED FILL — ?METFORMIN HCL 500MG TABLET: 500 | 30 days supply | Qty: 30 | Fill #4

## 2016-09-03 MED FILL — IPRAT-ALBUT 0.5-3(2.5) MG/3: 0.5-2.5 (3) | 15 days supply | Qty: 180 | Fill #4

## 2016-09-03 MED FILL — $ADVAIR 100/50 MCG INHALER: 100-50 | 30 days supply | Qty: 60 | Fill #1

## 2016-09-03 MED FILL — $VENTOLIN HFA 18G INHALER: 108 (90 BAS | 25 days supply | Qty: 18 | Fill #1

## 2016-09-06 ENCOUNTER — Telehealth: Payer: Self-pay | Admitting: Internal Medicine

## 2016-09-06 NOTE — Telephone Encounter (Signed)
Pt came into office requesting medication refill on losartan-hydrochlorothiazide (HYZAAR) 100-25 MG tablet. Pt states he is completely out. Please f/up

## 2016-09-23 ENCOUNTER — Ambulatory Visit: Payer: Self-pay | Admitting: Family Medicine

## 2016-11-13 ENCOUNTER — Other Ambulatory Visit: Payer: Self-pay

## 2016-11-13 MED ORDER — LOSARTAN POTASSIUM-HCTZ 100-25 MG PO TABS: 1.0000 | ORAL_TABLET | Freq: Every day | ORAL | 0 refills | Status: DC

## 2016-11-29 MED FILL — $VENTOLIN HFA 18G INHALER: 108 (90 BAS | 25 days supply | Qty: 18 | Fill #2

## 2016-11-29 MED FILL — IPRAT-ALBUT 0.5-3(2.5) MG/3: 0.5-2.5 (3) | 15 days supply | Qty: 180 | Fill #5

## 2016-11-29 MED FILL — ?METFORMIN HCL 500MG TABLET: 500 | 30 days supply | Qty: 30 | Fill #5

## 2016-11-29 MED FILL — LOSARTAN-HCTZ 100-25 MG TAB: 100-25 | 30 days supply | Qty: 30 | Fill #0

## 2016-11-29 MED FILL — $ADVAIR 100/50 MCG INHALER: 100-50 | 30 days supply | Qty: 60 | Fill #2

## 2017-01-29 ENCOUNTER — Other Ambulatory Visit: Payer: Self-pay | Admitting: Internal Medicine

## 2017-01-29 MED FILL — $ADVAIR 100/50 MCG INHALER: 100-50 | 30 days supply | Qty: 60 | Fill #3

## 2017-01-29 MED FILL — $VENTOLIN HFA 18G INHALER: 108 (90 BAS | 25 days supply | Qty: 18 | Fill #3

## 2017-01-29 MED FILL — ?METFORMIN HCL 500MG TABLET: 500 | 30 days supply | Qty: 30 | Fill #6

## 2017-02-20 ENCOUNTER — Ambulatory Visit: Payer: Self-pay | Admitting: Internal Medicine

## 2017-02-20 ENCOUNTER — Telehealth: Payer: Self-pay

## 2017-02-20 NOTE — Telephone Encounter (Signed)
Patient called and requested for listed medication to be refilled. losartan-hydrochlorothiazide (HYZAAR) 100-25 MG tablet [[id number]]

## 2017-02-21 ENCOUNTER — Ambulatory Visit: Payer: Self-pay | Admitting: Internal Medicine

## 2017-02-24 ENCOUNTER — Other Ambulatory Visit: Payer: Self-pay | Admitting: Pharmacist

## 2017-02-24 MED ORDER — LOSARTAN POTASSIUM-HCTZ 100-25 MG PO TABS: 1.0000 | ORAL_TABLET | Freq: Every day | ORAL | 0 refills | Status: DC

## 2017-02-24 MED FILL — LOSARTAN-HCTZ 100-25 MG TAB: 100-25 | 7 days supply | Qty: 7 | Fill #0

## 2017-02-27 ENCOUNTER — Ambulatory Visit: Payer: Self-pay | Admitting: Internal Medicine

## 2017-02-28 MED FILL — ?METFORMIN HCL 500MG TABLET: 500 | 30 days supply | Qty: 30 | Fill #7

## 2017-02-28 MED FILL — $ADVAIR 100/50 MCG INHALER: 100-50 | 90 days supply | Qty: 180 | Fill #4

## 2017-03-10 ENCOUNTER — Ambulatory Visit: Payer: Self-pay | Attending: Nurse Practitioner | Admitting: Nurse Practitioner

## 2017-03-10 ENCOUNTER — Encounter: Payer: Self-pay | Admitting: Nurse Practitioner

## 2017-03-10 VITALS — BP 142/58 | HR 116 | Temp 98.3°F | Ht 70.0 in | Wt 383.4 lb

## 2017-03-10 DIAGNOSIS — I878 Other specified disorders of veins: Secondary | ICD-10-CM | POA: Insufficient documentation

## 2017-03-10 DIAGNOSIS — Z79899 Other long term (current) drug therapy: Secondary | ICD-10-CM | POA: Insufficient documentation

## 2017-03-10 DIAGNOSIS — L97819 Non-pressure chronic ulcer of other part of right lower leg with unspecified severity: Secondary | ICD-10-CM | POA: Insufficient documentation

## 2017-03-10 DIAGNOSIS — I83018 Varicose veins of right lower extremity with ulcer other part of lower leg: Secondary | ICD-10-CM

## 2017-03-10 DIAGNOSIS — I10 Essential (primary) hypertension: Secondary | ICD-10-CM | POA: Insufficient documentation

## 2017-03-10 DIAGNOSIS — G4733 Obstructive sleep apnea (adult) (pediatric): Secondary | ICD-10-CM | POA: Insufficient documentation

## 2017-03-10 DIAGNOSIS — Z760 Encounter for issue of repeat prescription: Secondary | ICD-10-CM | POA: Insufficient documentation

## 2017-03-10 DIAGNOSIS — R000 Tachycardia, unspecified: Secondary | ICD-10-CM | POA: Insufficient documentation

## 2017-03-10 DIAGNOSIS — L97811 Non-pressure chronic ulcer of other part of right lower leg limited to breakdown of skin: Secondary | ICD-10-CM

## 2017-03-10 DIAGNOSIS — R7303 Prediabetes: Secondary | ICD-10-CM | POA: Insufficient documentation

## 2017-03-10 DIAGNOSIS — Z7951 Long term (current) use of inhaled steroids: Secondary | ICD-10-CM | POA: Insufficient documentation

## 2017-03-10 DIAGNOSIS — J449 Chronic obstructive pulmonary disease, unspecified: Secondary | ICD-10-CM | POA: Insufficient documentation

## 2017-03-10 DIAGNOSIS — Z7984 Long term (current) use of oral hypoglycemic drugs: Secondary | ICD-10-CM | POA: Insufficient documentation

## 2017-03-10 LAB — GLUCOSE, POCT (MANUAL RESULT ENTRY): POC GLUCOSE: 103 mg/dL — AB (ref 70–99)

## 2017-03-10 MED ORDER — LOSARTAN POTASSIUM-HCTZ 100-25 MG PO TABS: 1.0000 | ORAL_TABLET | Freq: Every day | ORAL | 1 refills | Status: DC

## 2017-03-10 MED ORDER — IPRATROPIUM-ALBUTEROL 0.5-2.5 (3) MG/3ML IN SOLN: 3.0000 mL | Freq: Four times a day (QID) | RESPIRATORY_TRACT | 2 refills | Status: DC | PRN

## 2017-03-10 MED ORDER — ALBUTEROL SULFATE HFA 108 (90 BASE) MCG/ACT IN AERS: 1.0000 | INHALATION_SPRAY | Freq: Four times a day (QID) | RESPIRATORY_TRACT | 3 refills | Status: DC | PRN

## 2017-03-10 MED ORDER — LOSARTAN POTASSIUM-HCTZ 100-25 MG PO TABS: 1.0000 | ORAL_TABLET | Freq: Every day | ORAL | 0 refills | Status: DC

## 2017-03-10 MED ORDER — METOPROLOL SUCCINATE ER 25 MG PO TB24: 25.0000 mg | ORAL_TABLET | Freq: Every day | ORAL | 3 refills | Status: DC

## 2017-03-10 MED ORDER — FLUTICASONE-SALMETEROL 100-50 MCG/DOSE IN AEPB
1.0000 | INHALATION_SPRAY | Freq: Two times a day (BID) | RESPIRATORY_TRACT | 3 refills | Status: DC
Start: 2017-03-10 — End: 2017-03-10

## 2017-03-10 MED ORDER — FLUTICASONE-SALMETEROL 100-50 MCG/DOSE IN AEPB: 1.0000 | INHALATION_SPRAY | Freq: Two times a day (BID) | RESPIRATORY_TRACT | 3 refills | Status: DC

## 2017-03-10 MED FILL — LOSARTAN-HCTZ 100-25 MG TAB: 100-25 | 30 days supply | Qty: 30 | Fill #0

## 2017-03-10 MED FILL — IPRAT-ALBUT 0.5-3(2.5) MG/3: 0.5-2.5 (3) | 30 days supply | Qty: 360 | Fill #0

## 2017-03-10 MED FILL — $VENTOLIN HFA 18G INHALER: 108 (90 BAS | 75 days supply | Qty: 54 | Fill #4

## 2017-03-10 MED FILL — METOPROLOL SUCCINATE ER 25: 25 | 30 days supply | Qty: 30 | Fill #0

## 2017-03-10 NOTE — Progress Notes (Signed)
Assessment & Plan:  Samuel [name] was seen today for establish care and medication refill.  Diagnoses and all orders for this visit:  Prediabetes -     Glucose (CBG)  Morbid obesity (HCC) Discussed diet and exercise for person with BMI >25. Instructed: You must burn more calories than you eat. Losing 5 percent of your body weight should be considered a success. In the longer term, losing more than 15 percent of your body weight and staying at this weight is an extremely good result. However, keep in mind that even losing 5 percent of your body weight leads to important health benefits, so try not to get discouraged if you're not able to lose more than this. Will recheck weight in 3-6 months.    COPD mixed type (HCC) -     albuterol (PROVENTIL HFA;VENTOLIN HFA) 108 (90 Base) MCG/ACT inhaler; Inhale 1-2 puffs into the lungs every 6 (six) hours as needed for wheezing. -     Fluticasone-Salmeterol (ADVAIR) 100-50 MCG/DOSE AEPB; Inhale 1 puff into the lungs 2 (two) times daily. -     ipratropium-albuterol (DUONEB) 0.5-2.5 (3) MG/3ML SOLN; Take 3 mLs by nebulization every 6 (six) hours as needed (sob/doe).  Essential hypertension -     losartan-hydrochlorothiazide (HYZAAR) 100-25 MG tablet; Take 1 tablet by mouth daily.  Continue all antihypertensives as prescribed.  Remember to bring in your blood pressure log with you for your follow up appointment.  DASH/Mediterranean Diets are healthier choices for HTN.    Venous stasis ulcer of other part of right lower leg limited to breakdown of skin, unspecified whether varicose veins present (HCC)  OSA (obstructive sleep apnea) -     Ambulatory referral to Sleep Studies  Tachycardia. -     metoprolol succinate (TOPROL-XL) 25 MG 24 hr tablet; Take 1 tablet (25 mg total) by mouth daily.    Patient has been counseled on age-appropriate routine health concerns for screening and prevention. These are reviewed and up-to-date. Referrals have been  placed accordingly. Immunizations are up-to-date or declined.    Subjective:   Chief Complaint  Patient presents with  . Establish Care    Patient is here to establish care.  . Medication Refill    Patient need medication refills.    [name] 47 y.o. male presents to office today to establish care.   Prediabetes He was diagnosed in 2015 with prediabetes. He has managed to keep his A1c below 5.8 for the past several years. Risk factors for CAD include obesity, gender and dyslipidemia.   Lab Results  Component Value Date   HGBA1C 5.6 04/01/2016   Essential Hypertension He endorses medication compliance and takes hyzaar 100-25 mg daily.  Denies chest pain, shortness of breath, palpitations, lightheadedness, dizziness, headaches or visual disturbances.  BP Readings from Last 3 Encounters:  03/10/17 (!) 142/58  04/01/16 (!) 170/100  03/21/16 172/99   Tachycardia He is tachycardic. This has been an ongoing issue. His most recent EKG showed sinus tachycardia with no ischemic changes. He denies any chest pain or shortness of breath. Will start low dose metoprolol today.    OSA  He had a sleep study performed in 2015 which confirmed sleep apnea but he did not follow up with CPAP titration. Needs new sleep study testing along with titration test. Will place order today.  COPD/Asthma He had asthma as a child and then started smoking in high school. He has since stopped smoking. Now with Asthma/COPD. His most recent flare/exacerbation was  03-20-2016 along with Pneumonia. His symptoms are currently controlled on advair, albuterol prn and duo nebs prn. He denies any wheezing or shortness of breath.    Venous stasis He has concerns regarding worsening venous stasis. He is wearing compression socks today however the sock on his right leg has rolled down to his lower shin area due to edema. He states he can pull the socks all the way up to his knees in the morning however as the day  progresses his right leg begins to swell and the sock no longer fits. His last visit with vascular surgery was on 03-21-2014. He was instructed to follow up on an as needed basis. There was discussion regarding lymphedema and the patient was referred to the wound clinic.   He has worn in [employer] in the past with significant resolution of stasis ulcer. There does not appear to be any acute ulceration or signs of infection at this time. Will refer to wound care clinic for follow up and possible compression therapy.          Review of Systems  Constitutional: Negative for fever, malaise/fatigue and weight loss.  HENT: Negative.  Negative for nosebleeds.   Eyes: Negative.  Negative for blurred vision, double vision and photophobia.  Respiratory: Negative.  Negative for cough, shortness of breath and wheezing.   Cardiovascular: Negative.  Negative for chest pain, palpitations and leg swelling.  Gastrointestinal: Negative.  Negative for abdominal pain, constipation, diarrhea, heartburn, nausea and vomiting.  Musculoskeletal: Negative.  Negative for myalgias.  Skin: Positive for itching and rash.  Neurological: Negative.  Negative for dizziness, focal weakness, seizures and headaches.  Endo/Heme/Allergies: Negative for environmental allergies.  Psychiatric/Behavioral: Negative.  Negative for suicidal ideas.      Past Medical History:  Diagnosis Date  . Asthma   . Ulcer    venous insufficiency    History reviewed. No pertinent surgical history.  Family History  Problem Relation Age of Onset  . Asthma Mother   . Hypertension Mother     Social History Reviewed with no changes to be made today.   Outpatient Medications Prior to Visit  Medication Sig Dispense Refill  . albuterol (PROVENTIL HFA;VENTOLIN HFA) 108 (90 Base) MCG/ACT inhaler Inhale 1-2 puffs into the lungs every 6 (six) hours as needed for wheezing. 54 g 3  . Fluticasone-Salmeterol (ADVAIR) 100-50 MCG/DOSE AEPB Inhale 1  puff into the lungs 2 (two) times daily. 180 each 3  . ipratropium-albuterol (DUONEB) 0.5-2.5 (3) MG/3ML SOLN Take 3 mLs by nebulization every 6 (six) hours as needed (sob/doe). 360 mL 2  . losartan-hydrochlorothiazide (HYZAAR) 100-25 MG tablet Take 1 tablet by mouth daily. 7 tablet 0  . azithromycin (ZITHROMAX) 250 MG tablet Take 1 tablet (250 mg total) by mouth daily. Take first 2 tablets together, then 1 every day until finished. (Patient not taking: Reported on 04/01/2016) 6 tablet 0  . cephALEXin (KEFLEX) 500 MG capsule Take 1 capsule (500 mg total) by mouth 4 (four) times daily. (Patient not taking: Reported on 03/20/2016) 20 capsule 0  . dextromethorphan-guaiFENesin (MUCINEX DM) 30-600 MG 12hr tablet Take 1-2 tablets by mouth 2 (two) times daily as needed for cough (and congestion).     Marland Kitchen doxycycline (VIBRA-TABS) 100 MG tablet Take 1 tablet (100 mg total) by mouth 2 (two) times daily. (Patient not taking: Reported on 03/20/2016) 20 tablet 0  . metFORMIN (GLUCOPHAGE) 500 MG tablet Take 1 tablet (500 mg total) by mouth daily with breakfast. (Patient not taking:  Reported on 03/10/2017) 90 tablet 3  . neomycin-bacitracin-polymyxin (NEOSPORIN) OINT Apply 1 application topically 2 (two) times daily. (Patient not taking: Reported on 03/20/2016) 56 g 3  . predniSONE (DELTASONE) 20 MG tablet Take 40 mg by mouth daily for 3 days, then 20mg  by mouth daily for 3 days, then 10mg  daily for 3 days (Patient not taking: Reported on 04/01/2016) 12 tablet 0  . terbinafine (LAMISIL AT) 1 % cream Apply 1 application topically 2 (two) times daily. (Patient not taking: Reported on 03/20/2016) 42 g 6  . traMADol (ULTRAM) 50 MG tablet Take 1 tablet (50 mg total) by mouth every 6 (six) hours as needed for moderate pain. (Patient not taking: Reported on 03/20/2016) 15 tablet 0   No facility-administered medications prior to visit.     Allergies  Allergen Reactions  . Shrimp [Shellfish Allergy]     Lip swell up.          Objective:    BP (!) 142/58 (BP Location: Right Arm, Patient Position: Sitting, Cuff Size: Large)   Pulse (!) 116   Temp 98.3 F (36.8 C) (Oral)   Ht 5\' 10"  (1.778 m)   Wt (!) 383 lb 6.4 oz (173.9 kg)   SpO2 93%   BMI 55.01 kg/m  Wt Readings from Last 3 Encounters:  03/10/17 (!) 383 lb 6.4 oz (173.9 kg)  04/01/16 (!) 365 lb 12.8 oz (165.9 kg)  03/20/16 (!) 363 lb (164.7 kg)    Physical Exam  Constitutional: He is oriented to person, place, and time. He appears well-developed and well-nourished. He is cooperative.  HENT:  Head: Normocephalic and atraumatic.  Eyes: EOM are normal.  Neck: Normal range of motion.  Cardiovascular: Regular rhythm, normal heart sounds and intact distal pulses. Tachycardia present. Exam reveals no gallop and no friction rub.  No murmur heard. Pulmonary/Chest: Effort normal and breath sounds normal. No tachypnea. No respiratory distress. He has no decreased breath sounds. He has no wheezes. He has no rhonchi. He has no rales. He exhibits no tenderness.  Abdominal: Soft. Bowel sounds are normal.  Musculoskeletal: Normal range of motion. He exhibits no edema.  Neurological: He is alert and oriented to person, place, and time. Coordination normal.  Skin: Skin is warm and dry.  Psychiatric: He has a normal mood and affect. His behavior is normal. Judgment and thought content normal.  Nursing note and vitals reviewed.     Patient has been counseled extensively about nutrition and exercise as well as the importance of adherence with medications and regular follow-up. The patient was given clear instructions to go to ER or return to medical center if symptoms don't improve, worsen or new problems develop. The patient verbalized understanding.   Follow-up: Follow up 2 weeks   Claiborne RiggZelda W Fleming, FNP-BC [facility] [place], [place] [phone]   03/10/2017, 9:54 PM

## 2017-03-14 ENCOUNTER — Other Ambulatory Visit: Payer: Self-pay | Admitting: Nurse Practitioner

## 2017-03-14 DIAGNOSIS — G4733 Obstructive sleep apnea (adult) (pediatric): Secondary | ICD-10-CM

## 2017-03-24 ENCOUNTER — Ambulatory Visit: Payer: Self-pay | Attending: Nurse Practitioner | Admitting: Nurse Practitioner

## 2017-03-24 ENCOUNTER — Encounter: Payer: Self-pay | Admitting: Nurse Practitioner

## 2017-03-24 ENCOUNTER — Ambulatory Visit: Payer: Self-pay

## 2017-03-24 VITALS — BP 141/91 | HR 108 | Temp 98.3°F | Ht 70.0 in | Wt 385.8 lb

## 2017-03-24 DIAGNOSIS — Z7951 Long term (current) use of inhaled steroids: Secondary | ICD-10-CM | POA: Insufficient documentation

## 2017-03-24 DIAGNOSIS — R000 Tachycardia, unspecified: Secondary | ICD-10-CM

## 2017-03-24 DIAGNOSIS — R7303 Prediabetes: Secondary | ICD-10-CM

## 2017-03-24 DIAGNOSIS — Z79899 Other long term (current) drug therapy: Secondary | ICD-10-CM | POA: Insufficient documentation

## 2017-03-24 DIAGNOSIS — I10 Essential (primary) hypertension: Secondary | ICD-10-CM | POA: Insufficient documentation

## 2017-03-24 DIAGNOSIS — J449 Chronic obstructive pulmonary disease, unspecified: Secondary | ICD-10-CM

## 2017-03-24 DIAGNOSIS — Z91013 Allergy to seafood: Secondary | ICD-10-CM | POA: Insufficient documentation

## 2017-03-24 DIAGNOSIS — I872 Venous insufficiency (chronic) (peripheral): Secondary | ICD-10-CM

## 2017-03-24 DIAGNOSIS — Z825 Family history of asthma and other chronic lower respiratory diseases: Secondary | ICD-10-CM | POA: Insufficient documentation

## 2017-03-24 DIAGNOSIS — Z8249 Family history of ischemic heart disease and other diseases of the circulatory system: Secondary | ICD-10-CM | POA: Insufficient documentation

## 2017-03-24 LAB — POCT GLYCOSYLATED HEMOGLOBIN (HGB A1C): Hemoglobin A1C: 5.6

## 2017-03-24 MED ORDER — ALBUTEROL SULFATE HFA 108 (90 BASE) MCG/ACT IN AERS: 1.0000 | INHALATION_SPRAY | Freq: Four times a day (QID) | RESPIRATORY_TRACT | 3 refills | Status: DC | PRN

## 2017-03-24 MED ORDER — FLUTICASONE-SALMETEROL 250-50 MCG/DOSE IN AEPB: 1.0000 | INHALATION_SPRAY | Freq: Two times a day (BID) | RESPIRATORY_TRACT | 6 refills | Status: DC

## 2017-03-24 MED ORDER — METOPROLOL SUCCINATE ER 50 MG PO TB24: 50.0000 mg | ORAL_TABLET | Freq: Every day | ORAL | 3 refills | Status: DC

## 2017-03-24 NOTE — Progress Notes (Signed)
Assessment & Plan:  Ren was seen today for follow-up and leg pain.  Diagnoses and all orders for this visit:  Tachycardia -     metoprolol succinate (TOPROL-XL) 50 MG 24 hr tablet; Take 1 tablet (50 mg total) by mouth daily. -     Thyroid Panel With TSH  COPD mixed type (HCC) -     Fluticasone-Salmeterol (ADVAIR DISKUS) 250-50 MCG/DOSE AEPB; Inhale 1 puff into the lungs 2 (two) times daily. -     albuterol (PROVENTIL HFA;VENTOLIN HFA) 108 (90 Base) MCG/ACT inhaler; Inhale 1-2 puffs into the lungs every 6 (six) hours as needed for wheezing or shortness of breath (coughing).  Prediabetes -     HgB A1c  Venous stasis dermatitis of right lower extremity -     AMB referral to wound care center    Patient has been counseled on age-appropriate routine health concerns for screening and prevention. These are reviewed and up-to-date. Referrals have been placed accordingly. Immunizations are up-to-date or declined.    Subjective:   Chief Complaint  Patient presents with  . Follow-up    Patient is here for a follow-up.  . Leg Pain    Patient stated every now and then he gets leg pain.   [name] 47 y.o. male presents to office today for follow up.  COPD  He has a history of asthma/COPD. Symptoms include dry cough, dyspnea when laying down, shortness of breath, tightness in chest and wheezing. Symptoms began several years ago (childhood asthma/ then a smoker as a young adult), unchanged since that time.  He denies constant cough, hemoptysis, hoarseness, and unresolving pneumonia.  Associated symptoms include NONE. Weight has been stable.  Appetite has been unaffected. Symptoms are exacerbated by any exercise, exposure to cold air, lying flat and strenuous activity.  Symptoms are alleviated by medication(s) (albuterol and Advair).He DENIES ANY CURRENT SYMPTOMS.   The patient has been deviating from this regimen as follows: using albuterol inhaler multple times per day. He  currently has tachycardia which could possibly be related to his medications. The last exacerbation occurred 1 year ago.    Tachycardia At his last office visit with me a few weeks ago I added metoprolol due to tachycardia and poorly controlled HTN. Today he is still tachycardic. I will increase his metoprolol to 50mg  from 25mg . I have instructed him to eliminate or reduce caffeine from his diet. I have also increased his advair to avoid excessive albuterol use.    Essential Hypertension Blood pressure not at goal. He endorses medication compliance with losartan-hctz 100-25 and metoprolol succinate 25mg  daily. He is not checking his blood pressure at home. He feels great today. Tolerating both antihypertensives well. Denies chest pain, shortness of breath, palpitations, lightheadedness, dizziness or headaches. BP Readings from Last 3 Encounters:  03/24/17 (!) 141/91  03/10/17 (!) 142/58  04/01/16 (!) 170/100    Review of Systems  Constitutional: Negative for fever, malaise/fatigue and weight loss.  HENT: Negative.  Negative for nosebleeds.   Eyes: Negative.  Negative for blurred vision, double vision and photophobia.  Respiratory: Positive for cough. Negative for sputum production, shortness of breath and wheezing.   Cardiovascular: Positive for leg swelling. Negative for chest pain and palpitations.  Gastrointestinal: Negative.  Negative for abdominal pain, constipation, diarrhea, heartburn, nausea and vomiting.  Musculoskeletal: Negative.  Negative for myalgias.  Neurological: Negative.  Negative for dizziness, focal weakness, seizures and headaches.  Endo/Heme/Allergies: Negative for environmental allergies.  Psychiatric/Behavioral: Negative.  Negative  for suicidal ideas.    Past Medical History:  Diagnosis Date  . Asthma   . Prediabetes   . Ulcer    venous insufficiency    History reviewed. No pertinent surgical history.  Family History  Problem Relation Age of Onset  .  Asthma Mother   . Hypertension Mother     Social History Reviewed with no changes to be made today.   Outpatient Medications Prior to Visit  Medication Sig Dispense Refill  . ipratropium-albuterol (DUONEB) 0.5-2.5 (3) MG/3ML SOLN Take 3 mLs by nebulization every 6 (six) hours as needed (sob/doe). 360 mL 2  . losartan-hydrochlorothiazide (HYZAAR) 100-25 MG tablet Take 1 tablet by mouth daily. 90 tablet 1  . albuterol (PROVENTIL HFA;VENTOLIN HFA) 108 (90 Base) MCG/ACT inhaler Inhale 1-2 puffs into the lungs every 6 (six) hours as needed for wheezing. 54 g 3  . Fluticasone-Salmeterol (ADVAIR) 100-50 MCG/DOSE AEPB Inhale 1 puff into the lungs 2 (two) times daily. 180 each 3  . metoprolol succinate (TOPROL-XL) 25 MG 24 hr tablet Take 1 tablet (25 mg total) by mouth daily. 90 tablet 3   No facility-administered medications prior to visit.     Allergies  Allergen Reactions  . Shrimp [Shellfish Allergy]     Lip swell up.        Objective:    BP (!) 141/91 (BP Location: Right Arm, Cuff Size: Large) Comment (Cuff Size): thigh cuff  Pulse (!) 108   Temp 98.3 F (36.8 C) (Oral)   Ht 5\' 10"  (1.778 m)   Wt (!) 385 lb 12.8 oz (175 kg)   SpO2 93%   BMI 55.36 kg/m  Wt Readings from Last 3 Encounters:  03/24/17 (!) 385 lb 12.8 oz (175 kg)  03/10/17 (!) 383 lb 6.4 oz (173.9 kg)  04/01/16 (!) 365 lb 12.8 oz (165.9 kg)    Physical Exam  Constitutional: He is oriented to person, place, and time. He appears well-developed and well-nourished. He is cooperative.  HENT:  Head: Normocephalic and atraumatic.  Eyes: EOM are normal.  Neck: Normal range of motion.  Cardiovascular: Regular rhythm and normal heart sounds. Tachycardia present. Exam reveals no gallop and no friction rub.  No murmur heard. Pulmonary/Chest: Effort normal and breath sounds normal. No tachypnea. No respiratory distress. He has no decreased breath sounds. He has no wheezes. He has no rhonchi. He has no rales. He exhibits no  tenderness.  Abdominal: Soft. Bowel sounds are normal.  Musculoskeletal: Normal range of motion. He exhibits edema.  Moderate BLE non pitting edema. He has his compression socks on today. No visible signs of venous stasis ulceration.   Neurological: He is alert and oriented to person, place, and time. Coordination normal.  Skin: Skin is warm and dry.  Psychiatric: He has a normal mood and affect. His behavior is normal. Judgment and thought content normal.  Nursing note and vitals reviewed.      Patient has been counseled extensively about nutrition and exercise as well as the importance of adherence with medications and regular follow-up. The patient was given clear instructions to go to ER or return to medical center if symptoms don't improve, worsen or new problems develop. The patient verbalized understanding.   Follow-up: Return in about 3 weeks (around 04/14/2017) for BP recheck and physical.   Claiborne RiggZelda W Khyren Hing, FNP-BC [facility] [place], [place] [phone]   03/24/2017, 12:32 PM

## 2017-03-24 NOTE — Patient Instructions (Addendum)
Sinus Tachycardia Sinus tachycardia is a kind of fast heartbeat. In sinus tachycardia, the heart beats more than 100 times a minute. Sinus tachycardia starts in a part of the heart called the sinus node. Sinus tachycardia may be harmless, or it may be a sign of a serious condition. What are the causes? This condition may be caused by:  Exercise or exertion.  A fever.  Pain.  Loss of body fluids (dehydration).  Severe bleeding (hemorrhage).  Anxiety and stress.  Certain substances, including: ? Alcohol. ? Caffeine. ? Tobacco and nicotine products. ? Diet pills. ? Illegal drugs.  Medical conditions including: ? Heart disease. ? An infection. ? An overactive thyroid (hyperthyroidism). ? A lack of red blood cells (anemia).  What are the signs or symptoms? Symptoms of this condition include:  A feeling that the heart is beating quickly (palpitations).  Suddenly noticing your heartbeat (cardiac awareness).  Dizziness.  Tiredness (fatigue).  Shortness of breath.  Chest pain.  Nausea.  Fainting.  How is this diagnosed? This condition is diagnosed with:  A physical exam.  Other tests, such as: ? Blood tests. ? An electrocardiogram (ECG). This test measures the electrical activity of the heart. ? Holter monitoring. For this test, you wear a device that records your heartbeat for one or more days.  You may be referred to a heart specialist (cardiologist). How is this treated? Treatment for this condition depends on the cause or underlying condition. Treatment may involve:  Treating the underlying condition.  Taking new medicines or changing your current medicines as told by your health care provider.  Making changes to your diet or lifestyle.  Practicing relaxation methods.  Follow these instructions at home: Lifestyle  Do not use any products that contain nicotine or tobacco, such as cigarettes and e-cigarettes. If you need help quitting, ask your  health care provider.  Learn relaxation methods, like deep breathing, to help you when you get stressed or anxious.  Do not use illegal drugs, such as cocaine.  Do not abuse alcohol. Limit alcohol intake to no more than 1 drink a day for non-pregnant women and 2 drinks a day for men. One drink equals 12 oz of beer, 5 oz of wine, or 1 oz of hard liquor.  Find time to rest and relax often. This reduces stress.  Avoid: ? Caffeine. ? Stimulants such as over-the-counter diet pills or pills that help you to stay awake. ? Situations that cause anxiety or stress. General instructions  Drink enough fluids to keep your urine clear or pale yellow.  Take over-the-counter and prescription medicines only as told by your health care provider.  Keep all follow-up visits as told by your health care provider. This is important. Contact a health care provider if:  You have a fever.  You have vomiting or diarrhea that keeps happening (is persistent). Get help right away if:  You have pain in your chest, upper arms, jaw, or neck.  You become weak or dizzy.  You feel faint.  You have palpitations that do not go away. This information is not intended to replace advice given to you by your health care provider. Make sure you discuss any questions you have with your health care provider. Document Released: 02/22/2004 Document Revised: 08/12/2015 Document Reviewed: 07/29/2014 Elsevier Interactive Patient Education  2018 [employer].  Sinus Tachycardia Sinus tachycardia is a kind of fast heartbeat. In sinus tachycardia, the heart beats more than 100 times a minute. Sinus tachycardia starts in a part  of the heart called the sinus node. Sinus tachycardia may be harmless, or it may be a sign of a serious condition. What are the causes? This condition may be caused by:  Exercise or exertion.  A fever.  Pain.  Loss of body fluids (dehydration).  Severe bleeding (hemorrhage).  Anxiety and  stress.  Certain substances, including: ? Alcohol. ? Caffeine. ? Tobacco and nicotine products. ? Diet pills. ? Illegal drugs.  Medical conditions including: ? Heart disease. ? An infection. ? An overactive thyroid (hyperthyroidism). ? A lack of red blood cells (anemia).  What are the signs or symptoms? Symptoms of this condition include:  A feeling that the heart is beating quickly (palpitations).  Suddenly noticing your heartbeat (cardiac awareness).  Dizziness.  Tiredness (fatigue).  Shortness of breath.  Chest pain.  Nausea.  Fainting.  How is this diagnosed? This condition is diagnosed with:  A physical exam.  Other tests, such as: ? Blood tests. ? An electrocardiogram (ECG). This test measures the electrical activity of the heart. ? Holter monitoring. For this test, you wear a device that records your heartbeat for one or more days.  You may be referred to a heart specialist (cardiologist). How is this treated? Treatment for this condition depends on the cause or underlying condition. Treatment may involve:  Treating the underlying condition.  Taking new medicines or changing your current medicines as told by your health care provider.  Making changes to your diet or lifestyle.  Practicing relaxation methods.  Follow these instructions at home: Lifestyle  Do not use any products that contain nicotine or tobacco, such as cigarettes and e-cigarettes. If you need help quitting, ask your health care provider.  Learn relaxation methods, like deep breathing, to help you when you get stressed or anxious.  Do not use illegal drugs, such as cocaine.  Do not abuse alcohol. Limit alcohol intake to no more than 1 drink a day for non-pregnant women and 2 drinks a day for men. One drink equals 12 oz of beer, 5 oz of wine, or 1 oz of hard liquor.  Find time to rest and relax often. This reduces stress.  Avoid: ? Caffeine. ? Stimulants such as  over-the-counter diet pills or pills that help you to stay awake. ? Situations that cause anxiety or stress. General instructions  Drink enough fluids to keep your urine clear or pale yellow.  Take over-the-counter and prescription medicines only as told by your health care provider.  Keep all follow-up visits as told by your health care provider. This is important. Contact a health care provider if:  You have a fever.  You have vomiting or diarrhea that keeps happening (is persistent). Get help right away if:  You have pain in your chest, upper arms, jaw, or neck.  You become weak or dizzy.  You feel faint.  You have palpitations that do not go away. This information is not intended to replace advice given to you by your health care provider. Make sure you discuss any questions you have with your health care provider. Document Released: 02/22/2004 Document Revised: 08/12/2015 Document Reviewed: 07/29/2014 Elsevier Interactive Patient Education  2018 [employer]  Asthma Action Plan, Adult Introduction Name: ________________________________ Date: _______ Follow-Up Visit With Health Care Provider Bring your medicines to your follow-up visits.  Health care provider name: ____________________  Telephone: ____________________  How often should I see my health care provider? ____________________  The actions that you should take to control your asthma are based  on the symptoms that you are having. The condition can be divided into 3 zones: the green zone, yellow zone, and red zone. Follow the action steps for the zone that you are in each day. Green zone: when asthma is under control  Signs and symptoms You may not have any symptoms while you are in the green zone. This means that you:  Have no coughing or wheezing, even while you are working or playing.  Sleep through the night.  Are breathing well.  If you use a peak flow meter: The peak flow is above ______ (80% of  your personal best or greater). You should take these medicines every day:  Controller medicine and dosage: ________________  Controller medicine and dosage: ________________  Controller medicine and dosage: ________________  Controller medicine and dosage: ________________  Before exercise, use this reliever or rescue medicine: ________________  Call your health care provider if:  You are using a reliever or rescue medicine more than 2-3 times per week.  Yellow zone: when asthma is getting worse  Signs and symptoms When you are in the yellow zone, you may have symptoms that interfere with exercise, are noticeably worse after exposure to triggers, or are worse at the first sign of a cold (upper respiratory infection). These may include:  Waking from sleep.  Coughing, especially at night or first thing in the morning.  Mild wheezing.  Chest tightness.  If you use a peak flow meter: The peak flow is _____ to _____ (50-79% of your personal best). Add the following medicine to the ones that you use daily:  Reliever or rescue medicine and dosage: ________________  Additional medicine and dosage: ________________  Call your health care provider if:  You are using a reliever or rescue medicine more than 2-3 times per week.  You remain in the yellow zone for _____ hours.  Red zone: when asthma is severe  Signs and symptoms You will likely feel distressed and have symptoms at rest that restrict your activity. You are in the red zone if:  Your breathing is hard and quickly.  Your nose opens wide, your ribs show, and your neck muscles become visible when you breathe in.  Your lips, fingers, or toes are a bluish color.  You have trouble speaking in full sentences.  Your symptoms do not improve within 15-20 minutes after you use your reliever or rescue medicine (bronchodilator).  If you use a peak flow meter: The peak flow is less than _____ (less than 50% of your  personal best). Call your local emergency services (911 in the [place] right away or seek help at the emergency department of the nearest hospital. Use your reliever or rescue medicine.  Start a nebulizer treatment or take 2-4 puffs from a metered-dose inhaler with a spacer.  Repeat this action every 15-20 minutes until help arrives.  What are some common asthma triggers? Discuss your asthma triggers with your health care provider. Some common triggers are:  Dander from the skin, hair, or feathers of animals.  Dust mites.  Cockroaches.  Pollen from trees or grass.  Mold.  Cigarette or tobacco smoke.  Air pollutants, such as dust, household cleaners, hair sprays, aerosol sprays, scented candles, paint fumes, strong chemicals, or strong odors.  Cold air or changes in weather. Cold air may cause inflammation. Winds increase molds and pollens in the air.  Strong emotions, such as crying or laughing hard.  Stress.  Certain medicines, such as aspirin or beta blockers.  Sulfites  in foods and drinks, such as dried fruits and wine.  Infections or inflammatory conditions, such as: ? Flu (influenza). ? Upper respiratory tract infection. ? Lower respiratory tract infection (pneumonia or bronchitis). ? Inflammation of the nasal membranes (rhinitis).  Gastroesophageal reflux disease (GERD). GERD is a condition in which stomach acid comes up into the throat (esophagus).  Exercise or strenuous activity.  This information is not intended to replace advice given to you by your health care provider. Make sure you discuss any questions you have with your health care provider. Document Released: 11/11/2008 Document Revised: 09/11/2015 Document Reviewed: 11/02/2013 Elsevier Interactive Patient Education  2018 [employer]

## 2017-03-25 LAB — THYROID PANEL WITH TSH
FREE THYROXINE INDEX: 2.3 (ref 1.2–4.9)
T3 Uptake Ratio: 27 % (ref 24–39)
T4 TOTAL: 8.5 ug/dL (ref 4.5–12.0)
TSH: 0.629 u[IU]/mL (ref 0.450–4.500)

## 2017-04-02 ENCOUNTER — Encounter (HOSPITAL_BASED_OUTPATIENT_CLINIC_OR_DEPARTMENT_OTHER): Payer: Self-pay

## 2017-04-07 ENCOUNTER — Encounter (HOSPITAL_BASED_OUTPATIENT_CLINIC_OR_DEPARTMENT_OTHER): Payer: Self-pay

## 2017-04-08 ENCOUNTER — Encounter (HOSPITAL_BASED_OUTPATIENT_CLINIC_OR_DEPARTMENT_OTHER): Payer: Self-pay | Attending: Internal Medicine

## 2017-04-08 DIAGNOSIS — I890 Lymphedema, not elsewhere classified: Secondary | ICD-10-CM | POA: Insufficient documentation

## 2017-04-08 DIAGNOSIS — L97811 Non-pressure chronic ulcer of other part of right lower leg limited to breakdown of skin: Secondary | ICD-10-CM | POA: Insufficient documentation

## 2017-04-08 DIAGNOSIS — J449 Chronic obstructive pulmonary disease, unspecified: Secondary | ICD-10-CM | POA: Insufficient documentation

## 2017-04-08 DIAGNOSIS — I87331 Chronic venous hypertension (idiopathic) with ulcer and inflammation of right lower extremity: Secondary | ICD-10-CM | POA: Insufficient documentation

## 2017-04-08 DIAGNOSIS — I10 Essential (primary) hypertension: Secondary | ICD-10-CM | POA: Insufficient documentation

## 2017-04-08 DIAGNOSIS — I739 Peripheral vascular disease, unspecified: Secondary | ICD-10-CM | POA: Insufficient documentation

## 2017-04-08 DIAGNOSIS — L97511 Non-pressure chronic ulcer of other part of right foot limited to breakdown of skin: Secondary | ICD-10-CM | POA: Insufficient documentation

## 2017-04-10 MED FILL — LOSARTAN-HCTZ 100-25 MG TAB: 100-25 | 30 days supply | Qty: 30 | Fill #1

## 2017-04-10 MED FILL — METOPROLOL SUCCINATE ER 50: 50 | 30 days supply | Qty: 30 | Fill #0

## 2017-04-10 MED FILL — $VENTOLIN HFA 18G INHALER: 108 (90 BAS | 25 days supply | Qty: 18 | Fill #0

## 2017-04-10 MED FILL — IPRAT-ALBUT 0.5-3(2.5) MG/3: 0.5-2.5 (3) | 30 days supply | Qty: 360 | Fill #1

## 2017-04-10 MED FILL — !ADVAIR 250/50 DISKUS: 250-50 | 30 days supply | Qty: 60 | Fill #0

## 2017-04-14 ENCOUNTER — Encounter: Payer: Self-pay | Admitting: Nurse Practitioner

## 2017-04-14 ENCOUNTER — Ambulatory Visit: Payer: Self-pay | Attending: Nurse Practitioner | Admitting: Nurse Practitioner

## 2017-04-14 ENCOUNTER — Ambulatory Visit: Payer: Self-pay

## 2017-04-14 VITALS — BP 129/89 | HR 105 | Temp 98.2°F | Ht 70.0 in | Wt 381.6 lb

## 2017-04-14 DIAGNOSIS — R000 Tachycardia, unspecified: Secondary | ICD-10-CM | POA: Insufficient documentation

## 2017-04-14 DIAGNOSIS — J45909 Unspecified asthma, uncomplicated: Secondary | ICD-10-CM | POA: Insufficient documentation

## 2017-04-14 DIAGNOSIS — Z91013 Allergy to seafood: Secondary | ICD-10-CM | POA: Insufficient documentation

## 2017-04-14 DIAGNOSIS — Z9114 Patient's other noncompliance with medication regimen: Secondary | ICD-10-CM | POA: Insufficient documentation

## 2017-04-14 DIAGNOSIS — Z79899 Other long term (current) drug therapy: Secondary | ICD-10-CM | POA: Insufficient documentation

## 2017-04-14 DIAGNOSIS — Z8249 Family history of ischemic heart disease and other diseases of the circulatory system: Secondary | ICD-10-CM | POA: Insufficient documentation

## 2017-04-14 DIAGNOSIS — I10 Essential (primary) hypertension: Secondary | ICD-10-CM | POA: Insufficient documentation

## 2017-04-14 DIAGNOSIS — Z825 Family history of asthma and other chronic lower respiratory diseases: Secondary | ICD-10-CM | POA: Insufficient documentation

## 2017-04-14 DIAGNOSIS — R7303 Prediabetes: Secondary | ICD-10-CM | POA: Insufficient documentation

## 2017-04-14 MED ORDER — LOSARTAN POTASSIUM-HCTZ 100-25 MG PO TABS: 1.0000 | ORAL_TABLET | Freq: Every day | ORAL | 1 refills | Status: DC

## 2017-04-14 MED ORDER — METOPROLOL SUCCINATE ER 50 MG PO TB24: 50.0000 mg | ORAL_TABLET | Freq: Every day | ORAL | 3 refills | Status: DC

## 2017-04-14 NOTE — Patient Instructions (Addendum)
Metoprolol tablets What is this medicine? METOPROLOL (me TOE proe lole) is a beta-blocker. Beta-blockers reduce the workload on the heart and help it to beat more regularly. This medicine is used to treat high blood pressure and to prevent chest pain. It is also used to after a heart attack and to prevent an additional heart attack from occurring. This medicine may be used for other purposes; ask your health care provider or pharmacist if you have questions. COMMON BRAND NAME(S): Lopressor What should I tell my health care provider before I take this medicine? They need to know if you have any of these conditions: -diabetes -heart or vessel disease like slow heart rate, worsening heart failure, heart block, sick sinus syndrome or Raynaud's disease -kidney disease -liver disease -lung or breathing disease, like asthma or emphysema -pheochromocytoma -thyroid disease -an unusual or allergic reaction to metoprolol, other beta-blockers, medicines, foods, dyes, or preservatives -pregnant or trying to get pregnant -breast-feeding How should I use this medicine? Take this medicine by mouth with a drink of water. Follow the directions on the prescription label. Take this medicine immediately after meals. Take your doses at regular intervals. Do not take more medicine than directed. Do not stop taking this medicine suddenly. This could lead to serious heart-related effects. Talk to your pediatrician regarding the use of this medicine in children. Special care may be needed. Overdosage: If you think you have taken too much of this medicine contact a poison control center or emergency room at once. NOTE: This medicine is only for you. Do not share this medicine with others. What if I miss a dose? If you miss a dose, take it as soon as you can. If it is almost time for your next dose, take only that dose. Do not take double or extra doses. What may interact with this medicine? This medicine may interact  with the following medications: -certain medicines for blood pressure, heart disease, irregular heart beat -certain medicines for depression like monoamine oxidase (MAO) inhibitors, fluoxetine, or paroxetine -clonidine -dobutamine -epinephrine -isoproterenol -reserpine This list may not describe all possible interactions. Give your health care provider a list of all the medicines, herbs, non-prescription drugs, or dietary supplements you use. Also tell them if you smoke, drink alcohol, or use illegal drugs. Some items may interact with your medicine. What should I watch for while using this medicine? Visit your doctor or health care professional for regular check ups. Contact your doctor right away if your symptoms worsen. Check your blood pressure and pulse rate regularly. Ask your health care professional what your blood pressure and pulse rate should be, and when you should contact them. You may get drowsy or dizzy. Do not drive, use machinery, or do anything that needs mental alertness until you know how this medicine affects you. Do not sit or stand up quickly, especially if you are an older patient. This reduces the risk of dizzy or fainting spells. Contact your doctor if these symptoms continue. Alcohol may interfere with the effect of this medicine. Avoid alcoholic drinks. What side effects may I notice from receiving this medicine? Side effects that you should report to your doctor or health care professional as soon as possible: -allergic reactions like skin rash, itching or hives -cold or numb hands or feet -depression -difficulty breathing -faint -fever with sore throat -irregular heartbeat, chest pain -rapid weight gain -swollen legs or ankles Side effects that usually do not require medical attention (report to your doctor or health care professional   if they continue or are bothersome): -anxiety or nervousness -change in sex drive or performance -dry  skin -headache -nightmares or trouble sleeping -short term memory loss -stomach upset or diarrhea -unusually tired This list may not describe all possible side effects. Call your doctor for medical advice about side effects. You may report side effects to FDA at 1-800-FDA-1088. Where should I keep my medicine? Keep out of the reach of children. Store at room temperature between 15 and 30 degrees C (59 and 86 degrees F). Throw away any unused medicine after the expiration date. NOTE: This sheet is a summary. It may not cover all possible information. If you have questions about this medicine, talk to your doctor, pharmacist, or health care provider.  2018 Elsevier/Gold Standard (2012-09-18 14:40:36)  

## 2017-04-14 NOTE — Progress Notes (Signed)
Assessment & Plan:  Samuel [name] was seen today for follow-up and medication problem.  Diagnoses and all orders for this visit:  Essential hypertension -     losartan-hydrochlorothiazide (HYZAAR) 100-25 MG tablet; Take 1 tablet by mouth daily. Continue all antihypertensives as prescribed.  Remember to bring in your blood pressure log with you for your follow up appointment.  DASH/Mediterranean Diets are healthier choices for HTN.    Tachycardia -     metoprolol succinate (TOPROL-XL) 50 MG 24 hr tablet; Take 1 tablet (50 mg total) by mouth daily.    Patient has been counseled on age-appropriate routine health concerns for screening and prevention. These are reviewed and up-to-date. Referrals have been placed accordingly. Immunizations are up-to-date or declined.    Subjective:   Chief Complaint  Patient presents with  . Follow-up    Pt's is here for a follow-up.   . Medication Problem    Pt's stated the Metoprolol makes him feel weird and stop taking it a week ago.    [name] 47 y.o. male presents to office today for follow up.   Essential Hypertension Chronic. Blood pressure is well controlled today. He does endorse medication noncompliance with metoprolol which he stopped taking a week ago. I encouraged him to continue his beta blocker for now as he continues to be tachycardic. Would not want to increase due to history of asthma. He will continue metoprolol for now as he endorses very vague symptoms of not liking how the metoprolol made him feel. He can not recall specifically what symptoms he experienced prior to stopping the metoprolol.  Denies chest pain, shortness of breath, palpitations, lightheadedness, dizziness, headaches or BLE edema. He is to continue on Hyzaar as well.  BP Readings from Last 3 Encounters:  04/14/17 129/89  03/24/17 (!) 141/91  03/10/17 (!) 142/58    Review of Systems  Constitutional: Negative for fever, malaise/fatigue and weight loss.   HENT: Negative.  Negative for nosebleeds.   Eyes: Negative.  Negative for blurred vision, double vision and photophobia.  Respiratory: Negative.  Negative for cough and shortness of breath.   Cardiovascular: Negative.  Negative for chest pain, palpitations and leg swelling.  Gastrointestinal: Negative.  Negative for heartburn, nausea and vomiting.  Musculoskeletal: Negative.  Negative for myalgias.  Skin: Positive for rash (venous stasis ulcer).  Neurological: Negative.  Negative for dizziness, focal weakness, seizures and headaches.  Psychiatric/Behavioral: Negative.  Negative for suicidal ideas.      Past Medical History:  Diagnosis Date  . Asthma   . Hypertension   . Prediabetes   . Ulcer    venous insufficiency    History reviewed. No pertinent surgical history.  Family History  Problem Relation Age of Onset  . Asthma Mother   . Hypertension Mother     Social History Reviewed with no changes to be made today.   Outpatient Medications Prior to Visit  Medication Sig Dispense Refill  . albuterol (PROVENTIL HFA;VENTOLIN HFA) 108 (90 Base) MCG/ACT inhaler Inhale 1-2 puffs into the lungs every 6 (six) hours as needed for wheezing or shortness of breath (coughing). 54 g 3  . Fluticasone-Salmeterol (ADVAIR DISKUS) 250-50 MCG/DOSE AEPB Inhale 1 puff into the lungs 2 (two) times daily. 60 each 6  . ipratropium-albuterol (DUONEB) 0.5-2.5 (3) MG/3ML SOLN Take 3 mLs by nebulization every 6 (six) hours as needed (sob/doe). 360 mL 2  . losartan-hydrochlorothiazide (HYZAAR) 100-25 MG tablet Take 1 tablet by mouth daily. 90 tablet 1  .  metoprolol succinate (TOPROL-XL) 50 MG 24 hr tablet Take 1 tablet (50 mg total) by mouth daily. 30 tablet 3   No facility-administered medications prior to visit.     Allergies  Allergen Reactions  . Shrimp [Shellfish Allergy]     Lip swell up.        Objective:    BP 129/89 (BP Location: Right Arm, Patient Position: Sitting, Cuff Size: Large)  Comment (Cuff Size): thigh cuff  Pulse (!) 105   Temp 98.2 F (36.8 C) (Oral)   Ht 5\' 10"  (1.778 m)   Wt (!) 381 lb 9.6 oz (173.1 kg)   SpO2 92%   BMI 54.75 kg/m  Wt Readings from Last 3 Encounters:  04/14/17 (!) 381 lb 9.6 oz (173.1 kg)  03/24/17 (!) 385 lb 12.8 oz (175 kg)  03/10/17 (!) 383 lb 6.4 oz (173.9 kg)    Physical Exam  Constitutional: He is oriented to person, place, and time. He appears well-developed and well-nourished. He is cooperative.  HENT:  Head: Normocephalic and atraumatic.  Eyes: EOM are normal.  Neck: Normal range of motion.  Cardiovascular: Normal rate, regular rhythm, normal heart sounds and intact distal pulses. Exam reveals no gallop and no friction rub.  No murmur heard. Pulmonary/Chest: Effort normal and breath sounds normal. No tachypnea. No respiratory distress. He has no decreased breath sounds. He has no wheezes. He has no rhonchi. He has no rales. He exhibits no tenderness.  Abdominal: Soft. Bowel sounds are normal.  Musculoskeletal: Normal range of motion. He exhibits edema. He exhibits no tenderness.       Legs: Neurological: He is alert and oriented to person, place, and time. Coordination normal.  Skin: Skin is warm and dry.  Psychiatric: He has a normal mood and affect. His behavior is normal. Judgment and thought content normal.  Nursing note and vitals reviewed.     Patient has been counseled extensively about nutrition and exercise as well as the importance of adherence with medications and regular follow-up. The patient was given clear instructions to go to ER or return to medical center if symptoms don't improve, worsen or new problems develop. The patient verbalized understanding.   Follow-up: Return in about 3 months (around 07/15/2017) for HTN.   Claiborne Rigg, FNP-BC [facility] [place], [place] [phone]   04/14/2017, 3:25 PM

## 2017-04-20 ENCOUNTER — Ambulatory Visit (HOSPITAL_BASED_OUTPATIENT_CLINIC_OR_DEPARTMENT_OTHER): Payer: Self-pay

## 2017-04-29 ENCOUNTER — Encounter (HOSPITAL_BASED_OUTPATIENT_CLINIC_OR_DEPARTMENT_OTHER): Payer: Self-pay | Attending: Internal Medicine

## 2017-04-29 DIAGNOSIS — I872 Venous insufficiency (chronic) (peripheral): Secondary | ICD-10-CM | POA: Insufficient documentation

## 2017-04-29 DIAGNOSIS — I890 Lymphedema, not elsewhere classified: Secondary | ICD-10-CM | POA: Insufficient documentation

## 2017-04-29 DIAGNOSIS — Z09 Encounter for follow-up examination after completed treatment for conditions other than malignant neoplasm: Secondary | ICD-10-CM | POA: Insufficient documentation

## 2017-04-29 DIAGNOSIS — Z872 Personal history of diseases of the skin and subcutaneous tissue: Secondary | ICD-10-CM | POA: Insufficient documentation

## 2017-05-05 ENCOUNTER — Encounter (HOSPITAL_BASED_OUTPATIENT_CLINIC_OR_DEPARTMENT_OTHER): Payer: Self-pay

## 2017-05-14 MED FILL — IPRAT-ALBUT 0.5-3(2.5) MG/3: 0.5-2.5 (3) | 30 days supply | Qty: 360 | Fill #2

## 2017-05-14 MED FILL — !ADVAIR 250/50 DISKUS: 250-50 | 30 days supply | Qty: 60 | Fill #1

## 2017-05-14 MED FILL — $VENTOLIN HFA 18G INHALER: 108 (90 BAS | 25 days supply | Qty: 18 | Fill #1

## 2017-05-14 MED FILL — METOPROLOL SUCCINATE ER 50: 50 | 30 days supply | Qty: 30 | Fill #1

## 2017-05-14 MED FILL — LOSARTAN-HCTZ 100-25 MG TAB: 100-25 | 30 days supply | Qty: 30 | Fill #2

## 2017-05-25 ENCOUNTER — Ambulatory Visit (HOSPITAL_BASED_OUTPATIENT_CLINIC_OR_DEPARTMENT_OTHER): Payer: Self-pay | Attending: Nurse Practitioner

## 2017-06-24 MED FILL — !ADVAIR 250/50 DISKUS: 250-50 | 30 days supply | Qty: 60 | Fill #2

## 2017-06-24 MED FILL — LOSARTAN-HCTZ 100-25 MG TAB: 100-25 | 30 days supply | Qty: 30 | Fill #3

## 2017-06-24 MED FILL — !VENTOLIN HFA INHALER: 108 (90 BAS | 25 days supply | Qty: 18 | Fill #2

## 2017-06-24 MED FILL — METOPROLOL SUCCINATE ER 50: 50 | 30 days supply | Qty: 30 | Fill #2

## 2017-06-27 ENCOUNTER — Encounter (HOSPITAL_BASED_OUTPATIENT_CLINIC_OR_DEPARTMENT_OTHER): Payer: Self-pay | Attending: Internal Medicine

## 2017-06-27 DIAGNOSIS — Z87891 Personal history of nicotine dependence: Secondary | ICD-10-CM | POA: Insufficient documentation

## 2017-06-27 DIAGNOSIS — J449 Chronic obstructive pulmonary disease, unspecified: Secondary | ICD-10-CM | POA: Insufficient documentation

## 2017-06-27 DIAGNOSIS — I87323 Chronic venous hypertension (idiopathic) with inflammation of bilateral lower extremity: Secondary | ICD-10-CM | POA: Insufficient documentation

## 2017-06-27 DIAGNOSIS — Z6841 Body Mass Index (BMI) 40.0 and over, adult: Secondary | ICD-10-CM | POA: Insufficient documentation

## 2017-06-27 DIAGNOSIS — L97822 Non-pressure chronic ulcer of other part of left lower leg with fat layer exposed: Secondary | ICD-10-CM | POA: Insufficient documentation

## 2017-06-27 DIAGNOSIS — L97819 Non-pressure chronic ulcer of other part of right lower leg with unspecified severity: Secondary | ICD-10-CM | POA: Insufficient documentation

## 2017-06-27 DIAGNOSIS — I890 Lymphedema, not elsewhere classified: Secondary | ICD-10-CM | POA: Insufficient documentation

## 2017-07-04 ENCOUNTER — Encounter (HOSPITAL_BASED_OUTPATIENT_CLINIC_OR_DEPARTMENT_OTHER): Payer: Self-pay

## 2017-07-04 ENCOUNTER — Encounter (HOSPITAL_BASED_OUTPATIENT_CLINIC_OR_DEPARTMENT_OTHER): Payer: Self-pay | Attending: Internal Medicine

## 2017-07-04 DIAGNOSIS — L97829 Non-pressure chronic ulcer of other part of left lower leg with unspecified severity: Secondary | ICD-10-CM | POA: Insufficient documentation

## 2017-07-04 DIAGNOSIS — L97822 Non-pressure chronic ulcer of other part of left lower leg with fat layer exposed: Secondary | ICD-10-CM | POA: Insufficient documentation

## 2017-07-04 DIAGNOSIS — L97812 Non-pressure chronic ulcer of other part of right lower leg with fat layer exposed: Secondary | ICD-10-CM | POA: Insufficient documentation

## 2017-07-04 DIAGNOSIS — I10 Essential (primary) hypertension: Secondary | ICD-10-CM | POA: Insufficient documentation

## 2017-07-04 DIAGNOSIS — I890 Lymphedema, not elsewhere classified: Secondary | ICD-10-CM | POA: Insufficient documentation

## 2017-07-04 DIAGNOSIS — J449 Chronic obstructive pulmonary disease, unspecified: Secondary | ICD-10-CM | POA: Insufficient documentation

## 2017-07-04 DIAGNOSIS — I87333 Chronic venous hypertension (idiopathic) with ulcer and inflammation of bilateral lower extremity: Secondary | ICD-10-CM | POA: Insufficient documentation

## 2017-07-04 DIAGNOSIS — I739 Peripheral vascular disease, unspecified: Secondary | ICD-10-CM | POA: Insufficient documentation

## 2017-07-15 ENCOUNTER — Ambulatory Visit: Payer: Self-pay | Attending: Nurse Practitioner | Admitting: Nurse Practitioner

## 2017-07-15 ENCOUNTER — Encounter: Payer: Self-pay | Admitting: Nurse Practitioner

## 2017-07-15 VITALS — BP 129/86 | HR 93 | Temp 98.4°F | Ht 70.0 in | Wt 380.8 lb

## 2017-07-15 DIAGNOSIS — Z79899 Other long term (current) drug therapy: Secondary | ICD-10-CM | POA: Insufficient documentation

## 2017-07-15 DIAGNOSIS — Z87898 Personal history of other specified conditions: Secondary | ICD-10-CM

## 2017-07-15 DIAGNOSIS — I10 Essential (primary) hypertension: Secondary | ICD-10-CM | POA: Insufficient documentation

## 2017-07-15 DIAGNOSIS — R21 Rash and other nonspecific skin eruption: Secondary | ICD-10-CM | POA: Insufficient documentation

## 2017-07-15 DIAGNOSIS — N529 Male erectile dysfunction, unspecified: Secondary | ICD-10-CM | POA: Insufficient documentation

## 2017-07-15 DIAGNOSIS — G4733 Obstructive sleep apnea (adult) (pediatric): Secondary | ICD-10-CM | POA: Insufficient documentation

## 2017-07-15 DIAGNOSIS — R7303 Prediabetes: Secondary | ICD-10-CM | POA: Insufficient documentation

## 2017-07-15 DIAGNOSIS — R000 Tachycardia, unspecified: Secondary | ICD-10-CM | POA: Insufficient documentation

## 2017-07-15 DIAGNOSIS — I872 Venous insufficiency (chronic) (peripheral): Secondary | ICD-10-CM | POA: Insufficient documentation

## 2017-07-15 DIAGNOSIS — J449 Chronic obstructive pulmonary disease, unspecified: Secondary | ICD-10-CM | POA: Insufficient documentation

## 2017-07-15 DIAGNOSIS — Z7951 Long term (current) use of inhaled steroids: Secondary | ICD-10-CM | POA: Insufficient documentation

## 2017-07-15 MED ORDER — HYDROXYZINE PAMOATE 50 MG PO CAPS: 50.0000 mg | ORAL_CAPSULE | Freq: Three times a day (TID) | ORAL | 0 refills | Status: DC | PRN

## 2017-07-15 MED ORDER — TRIAMCINOLONE ACETONIDE 0.025 % EX LOTN: 1.0000 "application " | TOPICAL_LOTION | Freq: Every day | CUTANEOUS | 1 refills | Status: DC

## 2017-07-15 MED ORDER — METOPROLOL SUCCINATE ER 50 MG PO TB24: 50.0000 mg | ORAL_TABLET | Freq: Every day | ORAL | 1 refills | Status: DC

## 2017-07-15 MED ORDER — LOSARTAN POTASSIUM-HCTZ 100-25 MG PO TABS: 1.0000 | ORAL_TABLET | Freq: Every day | ORAL | 1 refills | Status: DC

## 2017-07-15 MED ORDER — SILDENAFIL CITRATE 50 MG PO TABS: 50.0000 mg | ORAL_TABLET | Freq: Every day | ORAL | 0 refills | Status: DC | PRN

## 2017-07-15 MED ORDER — ALBUTEROL SULFATE HFA 108 (90 BASE) MCG/ACT IN AERS: 1.0000 | INHALATION_SPRAY | Freq: Four times a day (QID) | RESPIRATORY_TRACT | 3 refills | Status: DC | PRN

## 2017-07-15 MED ORDER — FLUTICASONE-SALMETEROL 250-50 MCG/DOSE IN AEPB: 1.0000 | INHALATION_SPRAY | Freq: Two times a day (BID) | RESPIRATORY_TRACT | 6 refills | Status: DC

## 2017-07-15 MED FILL — HYDROXYZINE PAM 50 MG CAP: 50 | 10 days supply | Qty: 30 | Fill #0

## 2017-07-15 MED FILL — !VENTOLIN HFA INHALER: 108 (90 BAS | 25 days supply | Qty: 18 | Fill #3

## 2017-07-15 MED FILL — !VIAGRA 50 MG TABLET: 50 | 30 days supply | Qty: 10 | Fill #0

## 2017-07-15 NOTE — Patient Instructions (Addendum)
Limit your caloric intake to 1800-2000mg  a day Aim for 30 minutes of exercise most days. Rethink what you drink. Water is great! Aim for 2-3 Carb Choices per meal (30-45 grams) +/- 1 either way  Aim for 5-10 Carbs per snack if hungry  Include protein in moderation with your meals and snacks  Continue taking medication as directed Be mindful about how much sugar you are adding to beverages and other foods. Fruit Punch - find one with no sugar  Measure and decrease portions of carbohydrate foods  Make your plate and don't go back for seconds   Erectile Dysfunction Erectile dysfunction (ED) is the inability to get or keep an erection in order to have sexual intercourse. Erectile dysfunction may include:  Inability to get an erection.  Lack of enough hardness of the erection to allow penetration.  Loss of the erection before sex is finished.  What are the causes? This condition may be caused by:  Certain medicines, such as: ? Pain relievers. ? Antihistamines. ? Antidepressants. ? Blood pressure medicines. ? Water pills (diuretics). ? Ulcer medicines. ? Muscle relaxants. ? Drugs.  Excessive drinking.  Psychological causes, such as: ? Anxiety. ? Depression. ? Sadness. ? Exhaustion. ? Performance fear. ? Stress.  Physical causes, such as: ? Artery problems. This may include diabetes, smoking, liver disease, or atherosclerosis. ? High blood pressure. ? Hormonal problems, such as low testosterone. ? Obesity. ? Nerve problems. This may include back or pelvic injuries, diabetes mellitus, multiple sclerosis, or Parkinson disease.  What are the signs or symptoms? Symptoms of this condition include:  Inability to get an erection.  Lack of enough hardness of the erection to allow penetration.  Loss of the erection before sex is finished.  Normal erections at some times, but with frequent unsatisfactory episodes.  Low sexual satisfaction in either partner due to  erection problems.  A curved penis occurring with erection. The curve may cause pain or the penis may be too curved to allow for intercourse.  Never having nighttime erections.  How is this diagnosed? This condition is often diagnosed by:  Performing a physical exam to find other diseases or specific problems with the penis.  Asking you detailed questions about the problem.  Performing blood tests to check for diabetes mellitus or to measure hormone levels.  Performing other tests to check for underlying health conditions.  Performing an ultrasound exam to check for scarring.  Performing a test to check blood flow to the penis.  Doing a sleep study at home to measure nighttime erections.  How is this treated? This condition may be treated by:  Medicine taken by mouth to help you achieve an erection (oral medicine).  Hormone replacement therapy to replace low testosterone levels.  Medicine that is injected into the penis. Your health care provider may instruct you how to give yourself these injections at home.  Vacuum pump. This is a pump with a ring on it. The pump and ring are placed on the penis and used to create pressure that helps the penis become erect.  Penile implant surgery. In this procedure, you may receive: ? An inflatable implant. This consists of cylinders, a pump, and a reservoir. The cylinders can be inflated with a fluid that helps to create an erection, and they can be deflated after intercourse. ? A semi-rigid implant. This consists of two silicone rubber rods. The rods provide some rigidity. They are also flexible, so the penis can both curve downward in its normal  position and become straight for sexual intercourse.  Blood vessel surgery, to improve blood flow to the penis. During this procedure, a blood vessel from a different part of the body is placed into the penis to allow blood to flow around (bypass) damaged or blocked blood vessels.  Lifestyle  changes, such as exercising more, losing weight, and quitting smoking.  Follow these instructions at home: Medicines  Take over-the-counter and prescription medicines only as told by your health care provider. Do not increase the dosage without first discussing it with your health care provider.  If you are using self-injections, perform injections as directed by your health care provider. Make sure to avoid any veins that are on the surface of the penis. After giving an injection, apply pressure to the injection site for 5 minutes. General instructions  Exercise regularly, as directed by your health care provider. Work with your health care provider to lose weight, if needed.  Do not use any products that contain nicotine or tobacco, such as cigarettes and e-cigarettes. If you need help quitting, ask your health care provider.  Before using a vacuum pump, read the instructions that come with the pump and discuss any questions with your health care provider.  Keep all follow-up visits as told by your health care provider. This is important. Contact a health care provider if:  You feel nauseous.  You vomit. Get help right away if:  You are taking oral or injectable medicines and you have an erection that lasts longer than 4 hours. If your health care provider is unavailable, go to the nearest emergency room for evaluation. An erection that lasts much longer than 4 hours can result in permanent damage to your penis.  You have severe pain in your groin or abdomen.  You develop redness or severe swelling of your penis.  You have redness spreading up into your groin or lower abdomen.  You are unable to urinate.  You experience chest pain or a rapid heart beat (palpitations) after taking oral medicines. Summary  Erectile dysfunction (ED) is the inability to get or keep an erection during sexual intercourse. This problem can usually be treated successfully.  This condition is  diagnosed based on a physical exam, your symptoms, and tests to determine the cause. Treatment varies depending on the cause, and may include medicines, hormone therapy, surgery, or vacuum pump.  You may need follow-up visits to make sure that you are using your medicines or devices correctly.  Get help right away if you are taking or injecting medicines and you have an erection that lasts longer than 4 hours. This information is not intended to replace advice given to you by your health care provider. Make sure you discuss any questions you have with your health care provider. Document Released: 01/12/2000 Document Revised: 01/31/2016 Document Reviewed: 01/31/2016 Elsevier Interactive Patient Education  2017 [employer].

## 2017-07-15 NOTE — Progress Notes (Signed)
Assessment & Plan:  Samuel [name] was seen today for follow-up and rash.  Diagnoses and all orders for this visit:  Essential hypertension -     losartan-hydrochlorothiazide (HYZAAR) 100-25 MG tablet; Take 1 tablet by mouth daily. -     metoprolol succinate (TOPROL-XL) 50 MG 24 hr tablet; Take 1 tablet (50 mg total) by mouth daily. -     Lipid panel -     CMP14+EGFR -     CBC Continue all antihypertensives as prescribed.  Remember to bring in your blood pressure log with you for your follow up appointment.  DASH/Mediterranean Diets are healthier choices for HTN.    COPD mixed type (HCC) -     albuterol (PROVENTIL HFA;VENTOLIN HFA) 108 (90 Base) MCG/ACT inhaler; Inhale 1-2 puffs into the lungs every 6 (six) hours as needed for wheezing or shortness of breath (coughing). -     Fluticasone-Salmeterol (ADVAIR DISKUS) 250-50 MCG/DOSE AEPB; Inhale 1 puff into the lungs 2 (two) times daily   Tachycardia -     metoprolol succinate (TOPROL-XL) 50 MG 24 hr tablet; Take 1 tablet (50 mg total) by mouth daily.  Erectile dysfunction, unspecified erectile dysfunction type -     sildenafil (VIAGRA) 50 MG tablet; Take 1 tablet (50 mg total) by mouth daily as needed for erectile dysfunction.  OSA (obstructive sleep apnea) -     Nocturnal polysomnography (NPSG); Future  History of prediabetes -     Microalbumin/Creatinine Ratio, Urine  Skin rash -     hydrOXYzine (VISTARIL) 50 MG capsule; Take 1 capsule (50 mg total) by mouth 3 (three) times daily as needed. -     Triamcinolone Acetonide 0.025 % LOTN; Apply 1 application topically daily.    Patient has been counseled on age-appropriate routine health concerns for screening and prevention. These are reviewed and up-to-date. Referrals have been placed accordingly. Immunizations are up-to-date or declined.    Subjective:   Chief Complaint  Patient presents with  . Follow-up    Pt. is here follow-up on blood pressure.  . Rash    Pt. stated he  have a rash on bothof his arm.    Samuel [name] 47 y.o. male presents to office today for follow up to HTN. He did not show for his Sleep study. Reports he had family issues. He is requesting that I refer him again to have it performed.   Essential Hypertension Chronic. Well controlled today. Weight is down 5 lbs. He has changed his diet and is walking more. We discussed dietary and exercise modifications to enhance his weight loss.  Taking Hyzaar 100-25 and Toprol XL 50 mg daily as prescribed. Denies chest pain, shortness of breath, palpitations, lightheadedness, dizziness, headaches or BLE edema.  BP Readings from Last 3 Encounters:  07/15/17 129/86  04/14/17 129/89  03/24/17 (!) 141/91    Rash Patient complains of rash involving the bilateral arms. Rash started a few weeks ago. Appearance of rash at onset: Texture of lesion(s): raised. Rash has not changed over time Discomfort associated with rash: is pruritic.  Associated symptoms: none. Denies: fever, nausea and vomiting. Patient has not had previous evaluation of rash. Patient has not had previous treatment.  Patient has not had contacts with similar rash. Patient has not identified precipitant. Patient has not had new exposures (soaps, lotions, laundry detergents, foods, medications, plants, insects or animals.)  Erectile Dysfunction Patient complains of erectile dysfunction.  Onset of dysfunction was over 1 year ago and was gradual in  onset.  Patient states the nature of difficulty is both attaining and maintaining erection. Full erections occur never. Partial erections occur with intercourse and with masturbation. Libido is not affected. Risk factors for ED include toprol xl and hyzaar. Patient denies history of cranial, spinal, or pelvic trauma. Patient's expectations as to sexual function achieve and maintain erection.   Patient's description of relationship w/partner no issues.  Previous treatment of ED includes NONE.   Review  of Systems  Constitutional: Negative for fever, malaise/fatigue and weight loss.  HENT: Negative.  Negative for nosebleeds.   Eyes: Negative.  Negative for blurred vision, double vision and photophobia.  Respiratory: Negative.  Negative for cough and shortness of breath.   Cardiovascular: Negative.  Negative for chest pain, palpitations and leg swelling.  Gastrointestinal: Negative.  Negative for heartburn, nausea and vomiting.  Genitourinary:       SEE Samuel  Musculoskeletal: Negative.  Negative for myalgias.  Skin: Positive for itching and rash.  Neurological: Negative.  Negative for dizziness, focal weakness, seizures and headaches.  Psychiatric/Behavioral: Negative.  Negative for suicidal ideas.    Past Medical History:  Diagnosis Date  . Asthma   . Hypertension   . Prediabetes   . Ulcer    venous insufficiency    History reviewed. No pertinent surgical history.  Family History  Problem Relation Age of Onset  . Asthma Mother   . Hypertension Mother     Social History Reviewed with no changes to be made today.   Outpatient Medications Prior to Visit  Medication Sig Dispense Refill  . albuterol (PROVENTIL HFA;VENTOLIN HFA) 108 (90 Base) MCG/ACT inhaler Inhale 1-2 puffs into the lungs every 6 (six) hours as needed for wheezing or shortness of breath (coughing). 54 g 3  . Fluticasone-Salmeterol (ADVAIR DISKUS) 250-50 MCG/DOSE AEPB Inhale 1 puff into the lungs 2 (two) times daily. 60 each 6  . metoprolol succinate (TOPROL-XL) 50 MG 24 hr tablet Take 1 tablet (50 mg total) by mouth daily. 30 tablet 3  . ipratropium-albuterol (DUONEB) 0.5-2.5 (3) MG/3ML SOLN Take 3 mLs by nebulization every 6 (six) hours as needed (sob/doe). 360 mL 2  . losartan-hydrochlorothiazide (HYZAAR) 100-25 MG tablet Take 1 tablet by mouth daily. 90 tablet 1   No facility-administered medications prior to visit.     Allergies  Allergen Reactions  . Shrimp [Shellfish Allergy]     Lip swell up.         Objective:    BP 129/86 (BP Location: Right Arm, Patient Position: Sitting, Cuff Size: Large)   Pulse 93   Temp 98.4 F (36.9 C) (Oral)   Ht _0  (1.778 m)   Wt (!) 380 lb 12.8 oz (172.7 kg)   SpO2 93%   BMI 54.64 kg/m  Wt Readings from Last 3 Encounters:  07/15/17 (!) 380 lb 12.8 oz (172.7 kg)  04/14/17 (!) 381 lb 9.6 oz (173.1 kg)  03/24/17 (!) 385 lb 12.8 oz (175 kg)    Physical Exam  Constitutional: He is oriented to person, place, and time. He appears well-developed and well-nourished. He is cooperative.  HENT:  Head: Normocephalic and atraumatic.  Eyes: EOM are normal.  Neck: Normal range of motion.  Cardiovascular: Normal rate, regular rhythm and normal heart sounds. Exam reveals no gallop and no friction rub.  No murmur heard. Pulmonary/Chest: Effort normal and breath sounds normal. No tachypnea. No respiratory distress. He has no decreased breath sounds. He has no wheezes. He has no rhonchi. He has no  rales. He exhibits no tenderness.  Abdominal: Soft. Bowel sounds are normal.  Musculoskeletal: Normal range of motion. He exhibits edema (BLE).  Neurological: He is alert and oriented to person, place, and time. Coordination normal.  Skin: Skin is warm and dry. Rash noted. Rash is maculopapular. Rash is not pustular and not vesicular. No erythema. No pallor.     Maculopapular rash on bilateral posterior forearms. No visible signs of infection.   Psychiatric: He has a normal mood and affect. His behavior is normal. Judgment and thought content normal.  Nursing note and vitals reviewed.     Patient has been counseled extensively about nutrition and exercise as well as the importance of adherence with medications and regular follow-up. The patient was given clear instructions to go to ER or return to medical center if symptoms don't improve, worsen or new problems develop. The patient verbalized understanding.   Follow-up: Return in about 3 months (around 10/15/2017) for  HTN.   Gildardo Pounds, FNP-BC [facility] [place], [place]   07/15/2017, 9:47 AM

## 2017-07-16 LAB — CBC
Hematocrit: 40 % (ref 37.5–51.0)
Hemoglobin: 13.3 g/dL (ref 13.0–17.7)
MCH: 26.5 pg — ABNORMAL LOW (ref 26.6–33.0)
MCHC: 33.3 g/dL (ref 31.5–35.7)
MCV: 80 fL (ref 79–97)
PLATELETS: 362 10*3/uL (ref 150–450)
RBC: 5.01 x10E6/uL (ref 4.14–5.80)
RDW: 15.4 % (ref 12.3–15.4)
WBC: 7.7 10*3/uL (ref 3.4–10.8)

## 2017-07-16 LAB — CMP14+EGFR
A/G RATIO: 1.2 (ref 1.2–2.2)
ALT: 17 IU/L (ref 0–44)
AST: 14 IU/L (ref 0–40)
Albumin: 4 g/dL (ref 3.5–5.5)
Alkaline Phosphatase: 72 IU/L (ref 39–117)
BUN/Creatinine Ratio: 11 (ref 9–20)
BUN: 12 mg/dL (ref 6–24)
Bilirubin Total: 0.3 mg/dL (ref 0.0–1.2)
CO2: 23 mmol/L (ref 20–29)
Calcium: 9.4 mg/dL (ref 8.7–10.2)
Chloride: 101 mmol/L (ref 96–106)
Creatinine, Ser: 1.11 mg/dL (ref 0.76–1.27)
GFR calc Af Amer: 92 mL/min/{1.73_m2} (ref >59)
GFR calc non Af Amer: 80 mL/min/{1.73_m2} (ref >59)
GLOBULIN, TOTAL: 3.4 g/dL (ref 1.5–4.5)
Glucose: 100 mg/dL — ABNORMAL HIGH (ref 65–99)
POTASSIUM: 3.9 mmol/L (ref 3.5–5.2)
SODIUM: 140 mmol/L (ref 134–144)
Total Protein: 7.4 g/dL (ref 6.0–8.5)

## 2017-07-16 LAB — MICROALBUMIN / CREATININE URINE RATIO
CREATININE, UR: 337.6 mg/dL
MICROALB/CREAT RATIO: 2.2 mg/g{creat} (ref 0.0–30.0)
Microalbumin, Urine: 7.3 ug/mL

## 2017-07-16 LAB — LIPID PANEL
CHOL/HDL RATIO: 4.1 ratio (ref 0.0–5.0)
CHOLESTEROL TOTAL: 156 mg/dL (ref 100–199)
HDL: 38 mg/dL — ABNORMAL LOW (ref >39)
LDL Calculated: 100 mg/dL — ABNORMAL HIGH (ref 0–99)
Triglycerides: 91 mg/dL (ref 0–149)
VLDL CHOLESTEROL CAL: 18 mg/dL (ref 5–40)

## 2017-07-28 ENCOUNTER — Encounter (HOSPITAL_BASED_OUTPATIENT_CLINIC_OR_DEPARTMENT_OTHER): Payer: Self-pay | Attending: Internal Medicine

## 2017-07-28 DIAGNOSIS — L97811 Non-pressure chronic ulcer of other part of right lower leg limited to breakdown of skin: Secondary | ICD-10-CM | POA: Insufficient documentation

## 2017-07-28 DIAGNOSIS — I87333 Chronic venous hypertension (idiopathic) with ulcer and inflammation of bilateral lower extremity: Secondary | ICD-10-CM | POA: Insufficient documentation

## 2017-07-28 DIAGNOSIS — I890 Lymphedema, not elsewhere classified: Secondary | ICD-10-CM | POA: Insufficient documentation

## 2017-07-28 DIAGNOSIS — I739 Peripheral vascular disease, unspecified: Secondary | ICD-10-CM | POA: Insufficient documentation

## 2017-07-28 DIAGNOSIS — I10 Essential (primary) hypertension: Secondary | ICD-10-CM | POA: Insufficient documentation

## 2017-07-28 DIAGNOSIS — L97821 Non-pressure chronic ulcer of other part of left lower leg limited to breakdown of skin: Secondary | ICD-10-CM | POA: Insufficient documentation

## 2017-07-28 DIAGNOSIS — J449 Chronic obstructive pulmonary disease, unspecified: Secondary | ICD-10-CM | POA: Insufficient documentation

## 2017-07-30 MED FILL — !ADVAIR 250/50 DISKUS: 250-50 | 30 days supply | Qty: 60 | Fill #0

## 2017-07-30 MED FILL — LOSARTAN-HCTZ 100-25 MG TAB: 100-25 | 30 days supply | Qty: 30 | Fill #0

## 2017-07-30 MED FILL — METOPROLOL SUCCINATE ER 50: 50 | 30 days supply | Qty: 30 | Fill #0

## 2017-07-30 MED FILL — TRIAMCINOLONE 0.025% LOTION: 0.025 | 30 days supply | Qty: 60 | Fill #0

## 2017-07-30 MED FILL — !VENTOLIN HFA INHALER: 108 (90 BAS | 25 days supply | Qty: 18 | Fill #0

## 2017-09-17 MED FILL — $VENTOLIN HFA 18G INHALER: 108 (90 BAS | 75 days supply | Qty: 54 | Fill #1

## 2017-09-17 MED FILL — METOPROLOL SUCCINATE ER 50: 50 | 30 days supply | Qty: 30 | Fill #1

## 2017-09-17 MED FILL — $ADVAIR 250/50 MCG INHALER: 250-50 | 90 days supply | Qty: 180 | Fill #1

## 2017-09-17 MED FILL — LOSARTAN-HCTZ 100-25 MG TAB: 100-25 | 30 days supply | Qty: 30 | Fill #1

## 2017-10-15 ENCOUNTER — Ambulatory Visit: Payer: Self-pay | Attending: Nurse Practitioner | Admitting: Nurse Practitioner

## 2017-10-15 ENCOUNTER — Encounter: Payer: Self-pay | Admitting: Nurse Practitioner

## 2017-10-15 VITALS — BP 138/93 | HR 91 | Temp 98.4°F | Ht 70.0 in | Wt 383.0 lb

## 2017-10-15 DIAGNOSIS — N529 Male erectile dysfunction, unspecified: Secondary | ICD-10-CM | POA: Insufficient documentation

## 2017-10-15 DIAGNOSIS — R000 Tachycardia, unspecified: Secondary | ICD-10-CM | POA: Insufficient documentation

## 2017-10-15 DIAGNOSIS — Z8249 Family history of ischemic heart disease and other diseases of the circulatory system: Secondary | ICD-10-CM | POA: Insufficient documentation

## 2017-10-15 DIAGNOSIS — Z09 Encounter for follow-up examination after completed treatment for conditions other than malignant neoplasm: Secondary | ICD-10-CM | POA: Insufficient documentation

## 2017-10-15 DIAGNOSIS — J45909 Unspecified asthma, uncomplicated: Secondary | ICD-10-CM | POA: Insufficient documentation

## 2017-10-15 DIAGNOSIS — R0789 Other chest pain: Secondary | ICD-10-CM | POA: Insufficient documentation

## 2017-10-15 DIAGNOSIS — R002 Palpitations: Secondary | ICD-10-CM | POA: Insufficient documentation

## 2017-10-15 DIAGNOSIS — R7303 Prediabetes: Secondary | ICD-10-CM | POA: Insufficient documentation

## 2017-10-15 DIAGNOSIS — I10 Essential (primary) hypertension: Secondary | ICD-10-CM | POA: Insufficient documentation

## 2017-10-15 DIAGNOSIS — Z825 Family history of asthma and other chronic lower respiratory diseases: Secondary | ICD-10-CM | POA: Insufficient documentation

## 2017-10-15 DIAGNOSIS — R079 Chest pain, unspecified: Secondary | ICD-10-CM

## 2017-10-15 DIAGNOSIS — Z79899 Other long term (current) drug therapy: Secondary | ICD-10-CM | POA: Insufficient documentation

## 2017-10-15 MED ORDER — LOSARTAN POTASSIUM-HCTZ 100-25 MG PO TABS: 1.0000 | ORAL_TABLET | Freq: Every day | ORAL | 1 refills | Status: DC

## 2017-10-15 MED ORDER — METOPROLOL SUCCINATE ER 50 MG PO TB24: 50.0000 mg | ORAL_TABLET | Freq: Every day | ORAL | 1 refills | Status: DC

## 2017-10-15 MED ORDER — SILDENAFIL CITRATE 100 MG PO TABS
100.0000 mg | ORAL_TABLET | Freq: Every day | ORAL | 1 refills | Status: DC | PRN
Start: 2017-10-15 — End: 2018-10-23

## 2017-10-15 MED FILL — METOPROLOL SUCCINATE ER 50: 50 | 30 days supply | Qty: 30 | Fill #0

## 2017-10-15 MED FILL — LOSARTAN-HCTZ 100-25 MG TAB: 100-25 | 30 days supply | Qty: 30 | Fill #0

## 2017-10-15 NOTE — Progress Notes (Signed)
Assessment & Plan:  [name] was seen today for follow-up and pain.  Diagnoses and all orders for this visit:  Essential hypertension -     metoprolol succinate (TOPROL-XL) 50 MG 24 hr tablet; Take 1 tablet (50 mg total) by mouth daily. -     losartan-hydrochlorothiazide (HYZAAR) 100-25 MG tablet; Take 1 tablet by mouth daily. Continue all antihypertensives as prescribed.  Remember to bring in your blood pressure log with you for your follow up appointment.  DASH/Mediterranean Diets are healthier choices for HTN.    Tachycardia -     metoprolol succinate (TOPROL-XL) 50 MG 24 hr tablet; Take 1 tablet (50 mg total) by mouth daily. Stable and well controlled  Erectile dysfunction, unspecified erectile dysfunction type -     sildenafil (VIAGRA) 100 MG tablet; Take 1 tablet (100 mg total) by mouth daily as needed for erectile dysfunction.  Right-sided chest pain -     DG Chest 4 View; Future    Patient has been counseled on age-appropriate routine health concerns for screening and prevention. These are reviewed and up-to-date. Referrals have been placed accordingly. Immunizations are up-to-date or declined.    Subjective:   Chief Complaint  Patient presents with  . Follow-up    Pt. is here to follow-up on hypertension.   . Pain    Pt. stated when he laughs or cough his right side have a sharp pain.    [name] 47 y.o. male presents to office today for f/u to HTN and erectile dysfunction. He has complaints of right sided rib cage pain as well as intermittent palpitations.    CHRONIC HYPERTENSION Disease Monitoring  Blood pressure range Slightly elevated today. He does endorse medication compliance.  BP Readings from Last 3 Encounters:  10/15/17 (!) 138/93  07/15/17 129/86  04/14/17 129/89   Chest pain: no   Dyspnea: no   Medication compliance: yes, taking Toprol XL 50mg  daily, Hyzaar 100-25 mg daily.   Medication Side Effects  Lightheadedness: no    Urinary frequency: no   Edema: no   Impotence: yes. He tried viagra 50mg  and reports he was able to experience a firmer erection however he still was unable to maintain the erection for more than 3 minutes. I will increase his viagra to  100mg .  Preventitive Healthcare:  Exercise: no   Diet Pattern: diet: general  Salt Restriction:  no   Right Sided Pain  Onset was several  months ago, with worsening course over the past few days. The patient describes the pain as intermittent, dull in nature, does not radiate. Patient rates pain as a 5/10 in intensity.  Associated symptoms are none. Aggravating factors are laughing,  coughing or sneezing.  Alleviating factors are: none. Patient's cardiac risk factors are hypertension, male gender and obesity (BMI >= 30 kg/m2).  Patient's risk factors for DVT/PE: none. Previous cardiac testing: cholesterol, HDL, LDL and thyroid function.    Palpitations: Patient complains of palpitations.  The symptoms are of mild and off and on severity, occuring intermittently and lasting several minutes per episode. Cardiac risk factors include: dyslipidemia, hypertension, male gender and microalbuminuria. Aggravating factors: none. Relieving factors: spontaneous. Associated signs and symptoms: none  Review of Systems  Constitutional: Negative for fever, malaise/fatigue and weight loss.  HENT: Negative.  Negative for nosebleeds.   Eyes: Negative.  Negative for blurred vision, double vision and photophobia.  Respiratory: Negative.  Negative for cough and shortness of breath.   Cardiovascular: Positive for palpitations. Negative  for chest pain and leg swelling.  Gastrointestinal: Negative.  Negative for heartburn, nausea and vomiting.  Genitourinary:       Erectile dysfunction  Musculoskeletal: Negative.  Negative for myalgias.       SEE HPI  Neurological: Negative.  Negative for dizziness, focal weakness, seizures and headaches.  Psychiatric/Behavioral: Negative.   Negative for suicidal ideas.    Past Medical History:  Diagnosis Date  . Asthma   . Hypertension   . Prediabetes   . Ulcer    venous insufficiency    History reviewed. No pertinent surgical history.  Family History  Problem Relation Age of Onset  . Asthma Mother   . Hypertension Mother     Social History Reviewed with no changes to be made today.   Outpatient Medications Prior to Visit  Medication Sig Dispense Refill  . albuterol (PROVENTIL HFA;VENTOLIN HFA) 108 (90 Base) MCG/ACT inhaler Inhale 1-2 puffs into the lungs every 6 (six) hours as needed for wheezing or shortness of breath (coughing). 54 g 3  . Fluticasone-Salmeterol (ADVAIR DISKUS) 250-50 MCG/DOSE AEPB Inhale 1 puff into the lungs 2 (two) times daily. 60 each 6  . hydrOXYzine (VISTARIL) 50 MG capsule Take 1 capsule (50 mg total) by mouth 3 (three) times daily as needed. 30 capsule 0  . ipratropium-albuterol (DUONEB) 0.5-2.5 (3) MG/3ML SOLN Take 3 mLs by nebulization every 6 (six) hours as needed (sob/doe). 360 mL 2  . sildenafil (VIAGRA) 50 MG tablet Take 1 tablet (50 mg total) by mouth daily as needed for erectile dysfunction. 10 tablet 0  . Triamcinolone Acetonide 0.025 % LOTN Apply 1 application topically daily. (Patient not taking: Reported on 10/15/2017) 60 mL 1  . losartan-hydrochlorothiazide (HYZAAR) 100-25 MG tablet Take 1 tablet by mouth daily. 90 tablet 1  . metoprolol succinate (TOPROL-XL) 50 MG 24 hr tablet Take 1 tablet (50 mg total) by mouth daily. 90 tablet 1   No facility-administered medications prior to visit.     Allergies  Allergen Reactions  . Shrimp [Shellfish Allergy]     Lip swell up.        Objective:    BP (!) 138/93 (BP Location: Right Arm, Patient Position: Sitting, Cuff Size: Large) Comment (Cuff Size): thigh cuff.  Pulse 91   Temp 98.4 F (36.9 C) (Oral)   Ht 5\' 10"  (1.778 m)   Wt (!) 383 lb (173.7 kg)   SpO2 96%   BMI 54.95 kg/m  Wt Readings from Last 3 Encounters:    10/15/17 (!) 383 lb (173.7 kg)  07/15/17 (!) 380 lb 12.8 oz (172.7 kg)  04/14/17 (!) 381 lb 9.6 oz (173.1 kg)    Physical Exam  Constitutional: He is oriented to person, place, and time. He appears well-developed and well-nourished. He is cooperative.  HENT:  Head: Normocephalic and atraumatic.  Eyes: EOM are normal.  Neck: Normal range of motion.  Cardiovascular: Normal rate, regular rhythm and normal heart sounds. Exam reveals no gallop and no friction rub.  No murmur heard. Pulmonary/Chest: Effort normal and breath sounds normal. No stridor. No tachypnea. No respiratory distress. He has no decreased breath sounds. He has no wheezes. He has no rhonchi. He has no rales. He exhibits tenderness.    Abdominal: Bowel sounds are normal.  Musculoskeletal: Normal range of motion. He exhibits no edema.  Neurological: He is alert and oriented to person, place, and time. Coordination normal.  Skin: Skin is warm and dry.  Psychiatric: He has a normal mood and affect.  His behavior is normal. Judgment and thought content normal.  Nursing note and vitals reviewed.      Patient has been counseled extensively about nutrition and exercise as well as the importance of adherence with medications and regular follow-up. The patient was given clear instructions to go to ER or return to medical center if symptoms don't improve, worsen or new problems develop. The patient verbalized understanding.   Follow-up: Return in about 3 months (around 01/14/2018) for physical.   Claiborne RiggZelda W Kelwin Gibler, FNP-BC [facility] [place], [place] [phone]   10/16/2017, 9:56 PM

## 2017-10-16 ENCOUNTER — Encounter: Payer: Self-pay | Admitting: Nurse Practitioner

## 2017-12-17 MED FILL — LOSARTAN-HCTZ 100-25 MG TAB: 100-25 | 30 days supply | Qty: 30 | Fill #1

## 2017-12-17 MED FILL — METOPROLOL SUCCINATE ER 50: 50 | 30 days supply | Qty: 30 | Fill #1

## 2018-01-14 ENCOUNTER — Ambulatory Visit: Payer: Self-pay | Admitting: Nurse Practitioner

## 2018-01-27 MED FILL — LOSARTAN-HCTZ 100-25 MG TAB: 100-25 | 30 days supply | Qty: 30 | Fill #2

## 2018-01-27 MED FILL — METOPROLOL SUCCINATE ER 50: 50 | 30 days supply | Qty: 30 | Fill #2

## 2018-03-18 MED FILL — LOSARTAN-HCTZ 100-25 MG TAB: 100-25 | 30 days supply | Qty: 30 | Fill #3

## 2018-03-18 MED FILL — METOPROLOL SUCCINATE ER 50: 50 | 30 days supply | Qty: 30 | Fill #3

## 2018-06-10 ENCOUNTER — Encounter (HOSPITAL_COMMUNITY): Payer: Self-pay | Admitting: Emergency Medicine

## 2018-06-10 ENCOUNTER — Other Ambulatory Visit: Payer: Self-pay

## 2018-06-10 ENCOUNTER — Emergency Department (HOSPITAL_COMMUNITY)
Admission: EM | Admit: 2018-06-10 | Discharge: 2018-06-11 | Disposition: A | Discharge status: Home / Self Care | Payer: Self-pay | Attending: Emergency Medicine | Admitting: Emergency Medicine

## 2018-06-10 DIAGNOSIS — J449 Chronic obstructive pulmonary disease, unspecified: Secondary | ICD-10-CM | POA: Insufficient documentation

## 2018-06-10 DIAGNOSIS — R609 Edema, unspecified: Secondary | ICD-10-CM | POA: Insufficient documentation

## 2018-06-10 DIAGNOSIS — I10 Essential (primary) hypertension: Secondary | ICD-10-CM | POA: Insufficient documentation

## 2018-06-10 DIAGNOSIS — Z6841 Body Mass Index (BMI) 40.0 and over, adult: Secondary | ICD-10-CM | POA: Insufficient documentation

## 2018-06-10 DIAGNOSIS — M25561 Pain in right knee: Secondary | ICD-10-CM | POA: Insufficient documentation

## 2018-06-10 DIAGNOSIS — E669 Obesity, unspecified: Secondary | ICD-10-CM | POA: Insufficient documentation

## 2018-06-10 NOTE — ED Triage Notes (Signed)
Pt reports R posterior knee pain intermittently x 1 month, worsening tonight at 6 pm. Pt denies hx of blood clots. Pt reports taking 800 mg ibuprofen and a muscle relaxer with no relief.

## 2018-06-10 NOTE — ED Provider Notes (Signed)
[facility] Provider Note   CSN: [id number] Arrival date & time: 06/10/18  2343    History   Chief Complaint Chief Complaint  Patient presents with   Knee Pain    [name] is a 48 y.o. male.     Patient to ED with complaint of right posterior knee pain. Symptoms started one month ago and have been intermittent, daily. He notes swelling of the knee joint. The pain is constant but worse with ambulation/weight bearing. No calf or thigh pain. No SOB, or chest pain. No history of blood clots. No redness, fever or wound.   The history is provided by the patient. No language interpreter was used.  Knee Pain  Associated symptoms: no fever     Past Medical History:  Diagnosis Date   Asthma    Hypertension    Prediabetes    Ulcer    venous insufficiency    Patient Active Problem List   Diagnosis Date Noted   Prediabetes 02/08/2014   Venous ulcer, limited to breakdown of skin (HCC) 09/02/2013   Essential hypertension 09/02/2013   COPD exacerbation (HCC) 05/20/2013   OSA (obstructive sleep apnea) 05/20/2013   Lymphedema 02/02/2013   Ulcer of lower extremity (HCC) 12/28/2012   Edema 12/21/2012   Preventative health care 11/18/2012   COPD GOLD II 10/24/2012   Morbid obesity (HCC) 10/24/2012   Edema leg 10/24/2012    No past surgical history on file.      Home Medications    Prior to Admission medications   Medication Sig Start Date End Date Taking? Authorizing Provider  albuterol (PROVENTIL HFA;VENTOLIN HFA) 108 (90 Base) MCG/ACT inhaler Inhale 1-2 puffs into the lungs every 6 (six) hours as needed for wheezing or shortness of breath (coughing). 07/15/17   Claiborne RiggFleming, Zelda W, NP  Fluticasone-Salmeterol (ADVAIR DISKUS) 250-50 MCG/DOSE AEPB Inhale 1 puff into the lungs 2 (two) times daily. 07/15/17   Claiborne RiggFleming, Zelda W, NP  hydrOXYzine (VISTARIL) 50 MG capsule Take 1 capsule (50 mg total) by mouth 3 (three)  times daily as needed. 07/15/17   Claiborne RiggFleming, Zelda W, NP  ipratropium-albuterol (DUONEB) 0.5-2.5 (3) MG/3ML SOLN Take 3 mLs by nebulization every 6 (six) hours as needed (sob/doe). 03/10/17   Claiborne RiggFleming, Zelda W, NP  losartan-hydrochlorothiazide (HYZAAR) 100-25 MG tablet Take 1 tablet by mouth daily. 10/15/17 01/13/18  Claiborne RiggFleming, Zelda W, NP  metoprolol succinate (TOPROL-XL) 50 MG 24 hr tablet Take 1 tablet (50 mg total) by mouth daily. 10/15/17 01/13/18  Claiborne RiggFleming, Zelda W, NP  sildenafil (VIAGRA) 100 MG tablet Take 1 tablet (100 mg total) by mouth daily as needed for erectile dysfunction. 10/15/17 11/14/17  Claiborne RiggFleming, Zelda W, NP  Triamcinolone Acetonide 0.025 % LOTN Apply 1 application topically daily. Patient not taking: Reported on 10/15/2017 07/15/17   Claiborne RiggFleming, Zelda W, NP    Family History Family History  Problem Relation Age of Onset   Asthma Mother    Hypertension Mother     Social History Social History   Tobacco Use   Smoking status: Former Smoker    Packs/day: 1.00    Years: 15.00    Pack years: 15.00    Types: Cigarettes    Last attempt to quit: 10/28/2012    Years since quitting: 5.6   Smokeless tobacco: Never Used  Substance Use Topics   Alcohol use: Yes    Alcohol/week: 4.0 standard drinks    Types: 2 Cans of beer, 2 Shots of liquor per week  Drug use: No     Allergies   Shrimp [shellfish allergy]   Review of Systems Review of Systems  Constitutional: Negative for fever.  Respiratory: Negative for shortness of breath.   Cardiovascular: Negative for chest pain.  Gastrointestinal: Negative for nausea.  Musculoskeletal:       See HPI.  Skin: Negative for color change and wound.  Neurological: Negative for weakness and numbness.     Physical Exam Updated Vital Signs Pulse (!) 105    Temp 97.7 F (36.5 C) (Oral)    Resp 20    Ht 5\' 8"  (1.727 m)    Wt (!) 165.6 kg    SpO2 98%    BMI 55.50 kg/m   Physical Exam Constitutional:      Appearance: He is  well-developed.  Neck:     Musculoskeletal: Normal range of motion.  Pulmonary:     Effort: Pulmonary effort is normal.  Musculoskeletal: Normal range of motion.     Comments: Right LE has circumferential scarring from mid lower leg to ankle. No acute redness, drainage or tenderness. Remainder calf nontender. There is no knee joint effusion, redness or warmth. Tender to posterior knee with palpable mass or induration. Thigh nontender, no erythema.   Skin:    General: Skin is warm and dry.  Neurological:     Mental Status: He is alert and oriented to person, place, and time.      ED Treatments / Results  Labs (all labs ordered are listed, but only abnormal results are displayed) Labs Reviewed - No data to display  EKG None  Radiology No results found.  Procedures Procedures (including critical care time)  Medications Ordered in ED Medications - No data to display   Initial Impression / Assessment and Plan / ED Course  I have reviewed the triage vital signs and the nursing notes.  Pertinent labs & imaging results that were available during my care of the patient were reviewed by me and considered in my medical decision making (see chart for details).        1. Right posterior knee pain  Patient to ED with right knee pain, onset one month ago, worst pain tonight.   DDx: Baker's cyst vs clot vs infection  No redness, warmth, fever or other evidence to suggest infection. Doubt clot given location and progression, but will have the patient return to hospital in the morning for a doppler study.   Pain treated with Percocet. Will refer to orthopedics.   Final Clinical Impressions(s) / ED Diagnoses   Final diagnoses:  None    ED Discharge Orders    None       Danne Harbor 06/11/18 0018    Palumbo, April, MD 06/11/18 9528

## 2018-06-11 ENCOUNTER — Ambulatory Visit (HOSPITAL_COMMUNITY)
Admission: RE | Admit: 2018-06-11 | Discharge: 2018-06-11 | Disposition: A | Discharge status: Home / Self Care | Payer: Self-pay | Source: Ambulatory Visit | Attending: Emergency Medicine | Admitting: Emergency Medicine

## 2018-06-11 ENCOUNTER — Telehealth (HOSPITAL_COMMUNITY): Payer: Self-pay | Admitting: Physician Assistant

## 2018-06-11 ENCOUNTER — Telehealth: Payer: Self-pay | Admitting: *Deleted

## 2018-06-11 DIAGNOSIS — R599 Enlarged lymph nodes, unspecified: Secondary | ICD-10-CM | POA: Insufficient documentation

## 2018-06-11 DIAGNOSIS — M25561 Pain in right knee: Secondary | ICD-10-CM | POA: Insufficient documentation

## 2018-06-11 DIAGNOSIS — M79609 Pain in unspecified limb: Secondary | ICD-10-CM

## 2018-06-11 MED ORDER — OXYCODONE-ACETAMINOPHEN 5-325 MG PO TABS
2.0000 | ORAL_TABLET | Freq: Once | ORAL | Status: AC
  Administered 2018-06-11: 2 via ORAL
  Filled 2018-06-11: qty 2

## 2018-06-11 MED ORDER — OXYCODONE-ACETAMINOPHEN 5-325 MG PO TABS: 1.0000 | ORAL_TABLET | Freq: Four times a day (QID) | ORAL | 0 refills | Status: DC | PRN

## 2018-06-11 MED FILL — !VENTOLIN HFA INHALER: 108 (90 BAS | 25 days supply | Qty: 18 | Fill #2

## 2018-06-11 MED FILL — LOSARTAN-HCTZ 100-25 MG TAB: 100-25 | 30 days supply | Qty: 30 | Fill #4

## 2018-06-11 MED FILL — METOPROLOL SUCCINATE ER 50: 50 | 30 days supply | Qty: 30 | Fill #4

## 2018-06-11 MED FILL — !ADVAIR 250/50 DISKUS: 250-50 | 30 days supply | Qty: 60 | Fill #2

## 2018-06-11 NOTE — Discharge Instructions (Signed)
Apply warm compresses to the posterior knee for additional comfort. Continue Ibuprofen for moderate pain and take Percocet for severe pain as directed.  Return in the morning to have a doppler study of the right leg to insure there is no blood clot.

## 2018-06-11 NOTE — ED Notes (Signed)
Patient verbalizes understanding of discharge instructions. Opportunity for questioning and answers were provided. Armband removed by staff, pt discharged from ED in wheelchair.  

## 2018-06-12 NOTE — Telephone Encounter (Signed)
Pt called regarding pain Rx (narcotic) sent to [facility] which does not fill narcotics.  [facility] consulted J Hedges, PA to assist.

## 2018-06-23 ENCOUNTER — Encounter: Payer: Self-pay | Admitting: Orthopaedic Surgery

## 2018-06-23 ENCOUNTER — Ambulatory Visit (INDEPENDENT_AMBULATORY_CARE_PROVIDER_SITE_OTHER): Payer: Self-pay | Admitting: Orthopaedic Surgery

## 2018-06-23 ENCOUNTER — Other Ambulatory Visit: Payer: Self-pay

## 2018-06-23 ENCOUNTER — Ambulatory Visit (INDEPENDENT_AMBULATORY_CARE_PROVIDER_SITE_OTHER): Payer: Self-pay

## 2018-06-23 DIAGNOSIS — M25561 Pain in right knee: Secondary | ICD-10-CM

## 2018-06-23 DIAGNOSIS — G8929 Other chronic pain: Secondary | ICD-10-CM

## 2018-06-23 MED ORDER — LIDOCAINE HCL 1 % IJ SOLN
2.0000 mL | INTRAMUSCULAR | Status: AC | PRN
  Administered 2018-06-23: 2 mL

## 2018-06-23 MED ORDER — METHYLPREDNISOLONE ACETATE 40 MG/ML IJ SUSP
40.0000 mg | INTRAMUSCULAR | Status: AC | PRN
  Administered 2018-06-23: 10:00:00 40 mg via INTRA_ARTICULAR

## 2018-06-23 MED ORDER — BUPIVACAINE HCL 0.25 % IJ SOLN
2.0000 mL | INTRAMUSCULAR | Status: AC | PRN
  Administered 2018-06-23: 2 mL via INTRA_ARTICULAR

## 2018-06-23 NOTE — Progress Notes (Signed)
Office Visit Note   Patient: Samuel [name]           Date of Birth: Dec 29, 1970           MRN: [id number] Visit Date: 06/23/2018              Requested by: Claiborne RiggFleming, Zelda W, NP [address] [place], [place] [id number] PCP: Claiborne RiggFleming, Zelda W, NP   Assessment & Plan: Visit Diagnoses:  1. Chronic pain of right knee     Plan: Impression is right knee osteoarthritis.  We will inject the right knee with cortisone today.  Of also discussed use of compression socks.  He will follow-up with his PCP for the bilateral lower extremity swelling.  Follow-up with us as needed.  Follow-Up Instructions: Return if symptoms worsen or fail to improve.   Orders:  Orders Placed This Encounter  Procedures  . Large Joint Inj: R knee  . XR KNEE 3 VIEW RIGHT   No orders of the defined types were placed in this encounter.     Procedures: Large Joint Inj: R knee on 06/23/2018 10:16 AM Indications: pain Details: 22 G needle, anterolateral approach Medications: 2 mL bupivacaine 0.25 %; 2 mL lidocaine 1 %; 40 mg methylPREDNISolone acetate 40 MG/ML      Clinical Data: No additional findings.   Subjective: Chief Complaint  Patient presents with  . Right Knee - Pain    HPI patient is a pleasant 48 year old gentleman who presents our clinic today with right knee pain.  This began approximately 1 month ago without any known injury or change in activity.  Over the past 2 weeks at worsened.  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  The pain he has to the posterior aspect.  He describes this as an intermittent deep ache.  Going from a seated to standing position seems to worsen the pain.  He is tried over-the-counter medications without relief of symptoms.  No previous cortisone injection or surgical intervention to the right knee.  He was recently seen in the ED for this.  He was given Percocet and referred for a venous Doppler ultrasound which was  negative.  Review of Systems as detailed in HPI.  All others reviewed and are negative.   Objective: Vital Signs: There were no vitals taken for this visit.  Physical Exam well-developed and well-nourished gentleman in no acute distress.  Alert and oriented x3.  Ortho Exam examination of his right knee reveals no effusion.  Range of motion 0 to 110 degrees.  No joint line tenderness.  He does have slight tenderness to the popliteal fossa.  Ligaments are stable.  He does have 1+ pitting edema bilateral lower extremities.  He is neurovascularly intact distally.  Specialty Comments:  No specialty comments available.  Imaging: Xr Knee 3 View Right  Result Date: 06/23/2018 Moderate medial compartment joint space narrowing    PMFS History: Patient Active Problem List   Diagnosis Date Noted  . Chronic pain of right knee 06/23/2018  . Prediabetes 02/08/2014  . Venous ulcer, limited to breakdown of skin (HCC) 09/02/2013  . Essential hypertension 09/02/2013  . COPD exacerbation (HCC) 05/20/2013  . OSA (obstructive sleep apnea) 05/20/2013  . Lymphedema 02/02/2013  . Ulcer of lower extremity (HCC) 12/28/2012  . Edema 12/21/2012  . Preventative health care 11/18/2012  . COPD GOLD II 10/24/2012  . Morbid obesity (HCC) 10/24/2012  . Edema leg 10/24/2012   Past Medical  History:  Diagnosis Date  . Asthma   . Hypertension   . Prediabetes   . Ulcer    venous insufficiency    Family History  Problem Relation Age of Onset  . Asthma Mother   . Hypertension Mother     History reviewed. No pertinent surgical history. Social History   Occupational History  . Occupation: Forensic scientist: [employer]  Tobacco Use  . Smoking status: Former Smoker    Packs/day: 1.00    Years: 15.00    Pack years: 15.00    Types: Cigarettes    Last attempt to quit: 10/28/2012    Years since quitting: 5.6  . Smokeless tobacco: Never Used  Substance and Sexual Activity  . Alcohol use: Yes     Alcohol/week: 4.0 standard drinks    Types: 2 Cans of beer, 2 Shots of liquor per week  . Drug use: No  . Sexual activity: Not on file

## 2018-06-27 ENCOUNTER — Telehealth: Payer: Self-pay | Admitting: Cardiology

## 2018-06-27 NOTE — Telephone Encounter (Signed)
Left message for patient regarding recent office visit 5/26.  Called is in regards to possible Covid exposure and setting up testing appt.

## 2018-07-03 ENCOUNTER — Telehealth: Payer: Self-pay | Admitting: *Deleted

## 2018-07-03 NOTE — Telephone Encounter (Signed)
I left a voicemail for pt to call back to [phone] pertaining to a potential COVID-19 exposure at a recent doctor's visit to offer him the opportunity to be tested.

## 2018-09-07 ENCOUNTER — Other Ambulatory Visit: Payer: Self-pay | Admitting: Nurse Practitioner

## 2018-09-07 DIAGNOSIS — J449 Chronic obstructive pulmonary disease, unspecified: Secondary | ICD-10-CM

## 2018-09-07 MED FILL — LOSARTAN-HCTZ 100-25 MG TAB: 100-25 | 30 days supply | Qty: 30 | Fill #5

## 2018-09-07 MED FILL — METOPROLOL SUCCINATE ER 50: 50 | 30 days supply | Qty: 30 | Fill #5

## 2018-10-23 ENCOUNTER — Other Ambulatory Visit: Payer: Self-pay

## 2018-10-23 ENCOUNTER — Encounter: Payer: Self-pay | Admitting: Nurse Practitioner

## 2018-10-23 ENCOUNTER — Ambulatory Visit: Payer: Self-pay | Attending: Nurse Practitioner | Admitting: Nurse Practitioner

## 2018-10-23 VITALS — BP 139/91 | HR 99 | Temp 99.0°F | Ht 70.0 in | Wt 378.0 lb

## 2018-10-23 DIAGNOSIS — Z130 Encounter for screening for diseases of the blood and blood-forming organs and certain disorders involving the immune mechanism: Secondary | ICD-10-CM

## 2018-10-23 DIAGNOSIS — L97811 Non-pressure chronic ulcer of other part of right lower leg limited to breakdown of skin: Secondary | ICD-10-CM

## 2018-10-23 DIAGNOSIS — N529 Male erectile dysfunction, unspecified: Secondary | ICD-10-CM

## 2018-10-23 DIAGNOSIS — Z131 Encounter for screening for diabetes mellitus: Secondary | ICD-10-CM

## 2018-10-23 DIAGNOSIS — Z6841 Body Mass Index (BMI) 40.0 and over, adult: Secondary | ICD-10-CM

## 2018-10-23 DIAGNOSIS — R000 Tachycardia, unspecified: Secondary | ICD-10-CM

## 2018-10-23 DIAGNOSIS — J449 Chronic obstructive pulmonary disease, unspecified: Secondary | ICD-10-CM

## 2018-10-23 DIAGNOSIS — I10 Essential (primary) hypertension: Secondary | ICD-10-CM

## 2018-10-23 DIAGNOSIS — I83018 Varicose veins of right lower extremity with ulcer other part of lower leg: Secondary | ICD-10-CM

## 2018-10-23 DIAGNOSIS — E785 Hyperlipidemia, unspecified: Secondary | ICD-10-CM

## 2018-10-23 MED ORDER — IPRATROPIUM-ALBUTEROL 0.5-2.5 (3) MG/3ML IN SOLN: 3.0000 mL | Freq: Four times a day (QID) | RESPIRATORY_TRACT | 2 refills | Status: DC | PRN

## 2018-10-23 MED ORDER — LOSARTAN POTASSIUM-HCTZ 100-25 MG PO TABS: 1.0000 | ORAL_TABLET | Freq: Every day | ORAL | 1 refills | Status: DC

## 2018-10-23 MED ORDER — FLUTICASONE-SALMETEROL 250-50 MCG/DOSE IN AEPB: 1.0000 | INHALATION_SPRAY | Freq: Two times a day (BID) | RESPIRATORY_TRACT | 6 refills | Status: DC

## 2018-10-23 MED ORDER — ALBUTEROL SULFATE HFA 108 (90 BASE) MCG/ACT IN AERS: 1.0000 | INHALATION_SPRAY | Freq: Four times a day (QID) | RESPIRATORY_TRACT | 1 refills | Status: DC | PRN

## 2018-10-23 MED ORDER — SILDENAFIL CITRATE 100 MG PO TABS: 100.0000 mg | ORAL_TABLET | Freq: Every day | ORAL | 1 refills | Status: DC | PRN

## 2018-10-23 MED ORDER — METOPROLOL SUCCINATE ER 50 MG PO TB24: 50.0000 mg | ORAL_TABLET | Freq: Every day | ORAL | 1 refills | Status: DC

## 2018-10-23 MED FILL — LOSARTAN-HCTZ 100-25 MG TAB: 100-25 | 30 days supply | Qty: 30 | Fill #0

## 2018-10-23 MED FILL — $ADVAIR 250/50 MCG INHALER: 250-50 | 90 days supply | Qty: 180 | Fill #0

## 2018-10-23 MED FILL — METOPROLOL SUCCINATE ER 50: 50 | 90 days supply | Qty: 90 | Fill #0

## 2018-10-23 MED FILL — IPRAT-ALBUT 0.5-3(2.5) MG/3: 0.5-2.5 (3) | 30 days supply | Qty: 360 | Fill #0

## 2018-10-23 MED FILL — SILDENAFIL CITRATE 100 MG T: 100 | 30 days supply | Qty: 10 | Fill #0

## 2018-10-23 MED FILL — !VENTOLIN HFA INHALER: 108 (90 BAS | 25 days supply | Qty: 18 | Fill #0

## 2018-10-23 NOTE — Progress Notes (Signed)
 Assessment & Plan:  Samuel [name] was seen today for follow-up.  Diagnoses and all orders for this visit:  Essential hypertension -     CMP14+EGFR -     metoprolol succinate (TOPROL-XL) 50 MG 24 hr tablet; Take 1 tablet (50 mg total) by mouth daily. -     losartan-hydrochlorothiazide (HYZAAR) 100-25 MG tablet; Take 1 tablet by mouth daily. Continue all antihypertensives as prescribed.  Remember to bring in your blood pressure log with you for your follow up appointment.  DASH/Mediterranean Diets are healthier choices for HTN.    COPD mixed type (HCC) Stable. Chronic. Controlled.  -     Fluticasone-Salmeterol (ADVAIR DISKUS) 250-50 MCG/DOSE AEPB; Inhale 1 puff into the lungs 2 (two) times daily. -     albuterol (VENTOLIN HFA) 108 (90 Base) MCG/ACT inhaler; Inhale 1-2 puffs into the lungs every 6 (six) hours as needed for wheezing or shortness of breath (coughing). -     ipratropium-albuterol (DUONEB) 0.5-2.5 (3) MG/3ML SOLN; Take 3 mLs by nebulization every 6 (six) hours as needed (sob/doe).  Dyslipidemia -     Lipid panel Lab Results  Component Value Date   LDLCALC 100 (H) 07/15/2017    Tachycardia -     metoprolol succinate (TOPROL-XL) 50 MG 24 hr tablet; Take 1 tablet (50 mg total) by mouth daily.  Erectile dysfunction, unspecified erectile dysfunction type -     sildenafil (VIAGRA) 100 MG tablet; Take 1 tablet (100 mg total) by mouth daily as needed for erectile dysfunction.  Diabetes mellitus screening -     Hemoglobin A1c Lab Results  Component Value Date   HGBA1C 5.8 (H) 10/23/2018    Screening for deficiency anemia -     CBC  Venous stasis ulcer of other part of right lower leg limited to breakdown of skin, unspecified whether varicose veins present (HCC) Currently stable. No active ulcerations or signs of infection. He continues to wear his compression stockings as instructed.   Morbid obesity with BMI of 50.0-59.9, adult (HCC) Discussed diet and exercise for  person with BMI >25. Instructed: You must burn more calories than you eat. Losing 5 percent of your body weight should be considered a success. In the longer term, losing more than 15 percent of your body weight and staying at this weight is an extremely good result. However, keep in mind that even losing 5 percent of your body weight leads to important health benefits, so try not to get discouraged if you're not able to lose more than this. Will recheck weight in 3-6 months.  Patient has been counseled on age-appropriate routine health concerns for screening and prevention. These are reviewed and up-to-date. Referrals have been placed accordingly. Immunizations are up-to-date or declined.    Subjective:   Chief Complaint  Patient presents with  . Follow-up    Pt. is here for a follow up on blood pressure and need medication refills.    [name] 48 y.o. male presents to office today for follow up.    has a past medical history of Asthma, Hypertension, Prediabetes, and Ulcer.  Essential Hypertension Slightly elevated today. Will not make any adjustments with antihypertensives as he has been out of his medication for a few days. He is not monitoring at home. Denies chest pain, shortness of breath, palpitations, lightheadedness, dizziness, headaches or BLE edema. Current medications include hyzaaar 100-25 mg daiy, toprol XL 50 mg daily.  BP Readings from Last 3 Encounters:  10/23/18 (!) 139/91  06/11/18   112/62  10/15/17 (!) 138/93   Asthma/COPD mixed type Well controlled. He denies any symptoms of exacerbation today including increased cough, shortness of breath or active wheezing. Will refill advair, albuterol and duonebs.   Review of Systems  Constitutional: Negative for fever, malaise/fatigue and weight loss.  HENT: Negative.  Negative for nosebleeds.   Eyes: Negative.  Negative for blurred vision, double vision and photophobia.  Respiratory: Negative.  Negative for cough  and shortness of breath.   Cardiovascular: Negative.  Negative for chest pain, palpitations and leg swelling.  Gastrointestinal: Negative.  Negative for heartburn, nausea and vomiting.  Musculoskeletal: Negative.  Negative for myalgias.  Neurological: Negative.  Negative for dizziness, focal weakness, seizures and headaches.  Endo/Heme/Allergies: Positive for environmental allergies.  Psychiatric/Behavioral: Negative.  Negative for suicidal ideas.    Past Medical History:  Diagnosis Date  . Asthma   . Hypertension   . Prediabetes   . Ulcer    venous insufficiency    History reviewed. No pertinent surgical history.  Family History  Problem Relation Age of Onset  . Asthma Samuel [name]   . Hypertension Samuel [name]     Social History Reviewed with no changes to be made today.   Outpatient Medications Prior to Visit  Medication Sig Dispense Refill  . albuterol (PROVENTIL HFA;VENTOLIN HFA) 108 (90 Base) MCG/ACT inhaler Inhale 1-2 puffs into the lungs every 6 (six) hours as needed for wheezing or shortness of breath (coughing). 54 g 3  . Fluticasone-Salmeterol (ADVAIR DISKUS) 250-50 MCG/DOSE AEPB Inhale 1 puff into the lungs 2 (two) times daily. 60 each 6  . ipratropium-albuterol (DUONEB) 0.5-2.5 (3) MG/3ML SOLN Take 3 mLs by nebulization every 6 (six) hours as needed (sob/doe). 360 mL 2  . hydrOXYzine (VISTARIL) 50 MG capsule Take 1 capsule (50 mg total) by mouth 3 (three) times daily as needed. (Patient not taking: Reported on 10/23/2018) 30 capsule 0  . losartan-hydrochlorothiazide (HYZAAR) 100-25 MG tablet Take 1 tablet by mouth daily. 90 tablet 1  . metoprolol succinate (TOPROL-XL) 50 MG 24 hr tablet Take 1 tablet (50 mg total) by mouth daily. 90 tablet 1  . oxyCODONE-acetaminophen (PERCOCET/ROXICET) 5-325 MG tablet Take 1 tablet by mouth every 6 (six) hours as needed for severe pain. (Patient not taking: Reported on 10/23/2018) 6 tablet 0  . sildenafil (VIAGRA) 100 MG tablet Take 1 tablet (100  mg total) by mouth daily as needed for erectile dysfunction. 10 tablet 1  . Triamcinolone Acetonide 0.025 % LOTN Apply 1 application topically daily. (Patient not taking: Reported on 10/15/2017) 60 mL 1   No facility-administered medications prior to visit.     Allergies  Allergen Reactions  . Shrimp [Shellfish Allergy]     Lip swell up.        Objective:    BP (!) 139/91 (BP Location: Right Arm, Patient Position: Sitting, Cuff Size: Large) Comment (Cuff Size): thigh  Pulse 99   Temp 99 F (37.2 C) (Oral)   Ht 5' 10" (1.778 m)   Wt (!) 378 lb (171.5 kg)   SpO2 95%   BMI 54.24 kg/m  Wt Readings from Last 3 Encounters:  10/23/18 (!) 378 lb (171.5 kg)  06/10/18 (!) 365 lb (165.6 kg)  10/15/17 (!) 383 lb (173.7 kg)    Physical Exam Vitals signs and nursing note reviewed.  Constitutional:      Appearance: He is well-developed.  HENT:     Head: Normocephalic and atraumatic.  Neck:     Musculoskeletal: Normal range of   motion.  Cardiovascular:     Rate and Rhythm: Normal rate and regular rhythm.     Heart sounds: Normal heart sounds. No murmur. No friction rub. No gallop.   Pulmonary:     Effort: Pulmonary effort is normal. No tachypnea or respiratory distress.     Breath sounds: Normal breath sounds. No decreased breath sounds, wheezing, rhonchi or rales.  Chest:     Chest wall: No tenderness.  Abdominal:     General: Bowel sounds are normal.     Palpations: Abdomen is soft.  Musculoskeletal: Normal range of motion.  Skin:    General: Skin is warm and dry.  Neurological:     Mental Status: He is alert and oriented to person, place, and time.     Coordination: Coordination normal.  Psychiatric:        Behavior: Behavior normal. Behavior is cooperative.        Thought Content: Thought content normal.        Judgment: Judgment normal.          Patient has been counseled extensively about nutrition and exercise as well as the importance of adherence with  medications and regular follow-up. The patient was given clear instructions to go to ER or return to medical center if symptoms don't improve, worsen or new problems develop. The patient verbalized understanding.   Follow-up: Return in about 3 months (around 01/22/2019).   Zelda W Fleming, FNP-BC [facility] [place], [place] [phone]   10/25/2018, 10:54 PM 

## 2018-10-24 LAB — CBC
Hematocrit: 42.1 % (ref 37.5–51.0)
Hemoglobin: 14 g/dL (ref 13.0–17.7)
MCH: 27.1 pg (ref 26.6–33.0)
MCHC: 33.3 g/dL (ref 31.5–35.7)
MCV: 81 fL (ref 79–97)
Platelets: 357 10*3/uL (ref 150–450)
RBC: 5.17 x10E6/uL (ref 4.14–5.80)
RDW: 13.9 % (ref 11.6–15.4)
WBC: 8 10*3/uL (ref 3.4–10.8)

## 2018-10-24 LAB — CMP14+EGFR
ALT: 17 IU/L (ref 0–44)
AST: 14 IU/L (ref 0–40)
Albumin/Globulin Ratio: 1.4 (ref 1.2–2.2)
Albumin: 4.3 g/dL (ref 4.0–5.0)
Alkaline Phosphatase: 77 IU/L (ref 39–117)
BUN/Creatinine Ratio: 13 (ref 9–20)
BUN: 14 mg/dL (ref 6–24)
Bilirubin Total: 0.3 mg/dL (ref 0.0–1.2)
CO2: 25 mmol/L (ref 20–29)
Calcium: 9.4 mg/dL (ref 8.7–10.2)
Chloride: 101 mmol/L (ref 96–106)
Creatinine, Ser: 1.12 mg/dL (ref 0.76–1.27)
GFR calc Af Amer: 91 mL/min/{1.73_m2} (ref >59)
GFR calc non Af Amer: 78 mL/min/{1.73_m2} (ref >59)
Globulin, Total: 3 g/dL (ref 1.5–4.5)
Glucose: 87 mg/dL (ref 65–99)
Potassium: 4.1 mmol/L (ref 3.5–5.2)
Sodium: 140 mmol/L (ref 134–144)
Total Protein: 7.3 g/dL (ref 6.0–8.5)

## 2018-10-24 LAB — LIPID PANEL
Chol/HDL Ratio: 4.5 ratio (ref 0.0–5.0)
Cholesterol, Total: 174 mg/dL (ref 100–199)
HDL: 39 mg/dL — ABNORMAL LOW (ref >39)
LDL Chol Calc (NIH): 105 mg/dL — ABNORMAL HIGH (ref 0–99)
Triglycerides: 170 mg/dL — ABNORMAL HIGH (ref 0–149)
VLDL Cholesterol Cal: 30 mg/dL (ref 5–40)

## 2018-10-24 LAB — HEMOGLOBIN A1C
Est. average glucose Bld gHb Est-mCnc: 120 mg/dL
Hgb A1c MFr Bld: 5.8 % — ABNORMAL HIGH (ref 4.8–5.6)

## 2018-10-25 ENCOUNTER — Encounter: Payer: Self-pay | Admitting: Nurse Practitioner

## 2018-11-19 ENCOUNTER — Encounter: Payer: Self-pay | Admitting: Nurse Practitioner

## 2018-12-03 MED FILL — !VENTOLIN HFA INHALER: 108 (90 BAS | 25 days supply | Qty: 18 | Fill #1

## 2018-12-03 MED FILL — IPRAT-ALBUT 0.5-3(2.5) MG/3: 0.5-2.5 (3) | 30 days supply | Qty: 360 | Fill #1

## 2018-12-03 MED FILL — LOSARTAN-HCTZ 100-25 MG TAB: 100-25 | 30 days supply | Qty: 30 | Fill #1

## 2018-12-03 MED FILL — SILDENAFIL CITRATE 100 MG T: 100 | 30 days supply | Qty: 10 | Fill #1

## 2019-01-25 ENCOUNTER — Ambulatory Visit: Payer: Self-pay | Admitting: Nurse Practitioner

## 2019-02-11 ENCOUNTER — Other Ambulatory Visit: Payer: Self-pay | Admitting: Nurse Practitioner

## 2019-02-11 DIAGNOSIS — N529 Male erectile dysfunction, unspecified: Secondary | ICD-10-CM

## 2019-02-11 DIAGNOSIS — J449 Chronic obstructive pulmonary disease, unspecified: Secondary | ICD-10-CM

## 2019-02-11 MED FILL — IPRAT-ALBUT 0.5-3(2.5) MG/3: 0.5-2.5 (3) | 30 days supply | Qty: 360 | Fill #2

## 2019-02-11 MED FILL — LOSARTAN-HCTZ 100-25 MG TAB: 100-25 | 30 days supply | Qty: 30 | Fill #2

## 2019-02-18 ENCOUNTER — Telehealth: Payer: Self-pay | Admitting: Nurse Practitioner

## 2019-02-18 DIAGNOSIS — J449 Chronic obstructive pulmonary disease, unspecified: Secondary | ICD-10-CM

## 2019-02-18 MED ORDER — ALBUTEROL SULFATE HFA 108 (90 BASE) MCG/ACT IN AERS: 1.0000 | INHALATION_SPRAY | Freq: Four times a day (QID) | RESPIRATORY_TRACT | 0 refills | Status: DC | PRN

## 2019-02-18 MED FILL — !VENTOLIN HFA INHALER: 108 (90 BAS | 25 days supply | Qty: 18 | Fill #0

## 2019-02-18 NOTE — Telephone Encounter (Signed)
Rx sent 

## 2019-02-18 NOTE — Telephone Encounter (Signed)
1) Medication(s) Requested (by name): -albuterol (VENTOLIN HFA) 108 (90 Base) MCG/ACT inhaler   2) Pharmacy of Choice: -[facility] - [place], [place] - [address]   3) Special Requests: -Until 03/02/2019

## 2019-03-02 ENCOUNTER — Ambulatory Visit: Payer: Self-pay | Admitting: Nurse Practitioner

## 2019-03-09 ENCOUNTER — Encounter: Payer: Self-pay | Admitting: Nurse Practitioner

## 2019-03-09 ENCOUNTER — Other Ambulatory Visit: Payer: Self-pay

## 2019-03-09 ENCOUNTER — Ambulatory Visit: Payer: Self-pay | Attending: Nurse Practitioner | Admitting: Nurse Practitioner

## 2019-03-09 VITALS — BP 132/85 | HR 105 | Temp 96.8°F | Ht 70.0 in | Wt 383.0 lb

## 2019-03-09 DIAGNOSIS — I10 Essential (primary) hypertension: Secondary | ICD-10-CM

## 2019-03-09 DIAGNOSIS — R000 Tachycardia, unspecified: Secondary | ICD-10-CM

## 2019-03-09 DIAGNOSIS — N529 Male erectile dysfunction, unspecified: Secondary | ICD-10-CM

## 2019-03-09 DIAGNOSIS — J449 Chronic obstructive pulmonary disease, unspecified: Secondary | ICD-10-CM

## 2019-03-09 DIAGNOSIS — E785 Hyperlipidemia, unspecified: Secondary | ICD-10-CM

## 2019-03-09 DIAGNOSIS — Z130 Encounter for screening for diseases of the blood and blood-forming organs and certain disorders involving the immune mechanism: Secondary | ICD-10-CM

## 2019-03-09 DIAGNOSIS — R1905 Periumbilic swelling, mass or lump: Secondary | ICD-10-CM

## 2019-03-09 MED ORDER — ALBUTEROL SULFATE HFA 108 (90 BASE) MCG/ACT IN AERS: 1.0000 | INHALATION_SPRAY | Freq: Four times a day (QID) | RESPIRATORY_TRACT | 0 refills | Status: DC | PRN

## 2019-03-09 MED ORDER — FLUTICASONE-SALMETEROL 250-50 MCG/DOSE IN AEPB: 1.0000 | INHALATION_SPRAY | Freq: Two times a day (BID) | RESPIRATORY_TRACT | 6 refills | Status: DC

## 2019-03-09 MED ORDER — SILDENAFIL CITRATE 100 MG PO TABS: 100.0000 mg | ORAL_TABLET | Freq: Every day | ORAL | 1 refills | Status: DC | PRN

## 2019-03-09 MED ORDER — LOSARTAN POTASSIUM-HCTZ 100-25 MG PO TABS: 1.0000 | ORAL_TABLET | Freq: Every day | ORAL | 1 refills | Status: DC

## 2019-03-09 MED ORDER — IPRATROPIUM-ALBUTEROL 0.5-2.5 (3) MG/3ML IN SOLN: 3.0000 mL | Freq: Four times a day (QID) | RESPIRATORY_TRACT | 2 refills | Status: DC | PRN

## 2019-03-09 MED ORDER — METOPROLOL SUCCINATE ER 50 MG PO TB24: 50.0000 mg | ORAL_TABLET | Freq: Every day | ORAL | 1 refills | Status: DC

## 2019-03-09 NOTE — Progress Notes (Signed)
Assessment & Plan:  Samuel [name] was seen today for follow-up.  Diagnoses and all orders for this visit:  Essential hypertension -     CMP14+EGFR Continue all antihypertensives as prescribed.  Remember to bring in your blood pressure log with you for your follow up appointment.  DASH/Mediterranean Diets are healthier choices for HTN.   Dyslipidemia -     Lipid panel INSTRUCTIONS: Work on a low fat, heart healthy diet and participate in regular aerobic exercise program by working out at least 150 minutes per week; 5 days a week-30 minutes per day. Avoid red meat/beef/steak,  fried foods. junk foods, sodas, sugary drinks, unhealthy snacking, alcohol and smoking.  Drink at least 80 oz of water per day and monitor your carbohydrate intake daily.    Screening for deficiency anemia -     CBC  COPD Well controlled. Taking advair as prescribed. Denies cough, worsening shortness of breath and wheezing  Patient has been counseled on age-appropriate routine health concerns for screening and prevention. These are reviewed and up-to-date. Referrals have been placed accordingly. Immunizations are up-to-date or declined.    Subjective:   Chief Complaint  Patient presents with  . Follow-up    Pt. is here for hypertension follow up.    [name] 49 y.o. male presents to office today for follow up.  has a past medical history of Asthma, Hypertension, Prediabetes, and Ulcer.  Essential Hypertension Well controlled. Taking Hyzaar 100-25 mg daily and metoprolol XL 50 mg daily. He does not monitor his blood pressure at home and does not endorse chest pain, shortness of breath, palpitations, lightheadedness, dizziness, headaches or BLE edema. Needs more dietary and exercise adherence.  BP Readings from Last 3 Encounters:  03/09/19 132/85  10/23/18 (!) 139/91  06/11/18 112/62    Dyslipidemia LDL at goal of <100.  Lab Results  Component Value Date   LDLCALC 98 03/09/2019    Review  of Systems  Constitutional: Negative for fever, malaise/fatigue and weight loss.  HENT: Negative.  Negative for nosebleeds.   Eyes: Negative.  Negative for blurred vision, double vision and photophobia.  Respiratory: Negative.  Negative for cough and shortness of breath.   Cardiovascular: Negative.  Negative for chest pain, palpitations and leg swelling.  Gastrointestinal: Negative.  Negative for heartburn, nausea and vomiting.  Genitourinary:       ED  Musculoskeletal: Negative.  Negative for myalgias.  Neurological: Negative.  Negative for dizziness, focal weakness, seizures and headaches.  Psychiatric/Behavioral: Negative.  Negative for suicidal ideas.    Past Medical History:  Diagnosis Date  . Asthma   . Hypertension   . Prediabetes   . Ulcer    venous insufficiency    History reviewed. No pertinent surgical history.  Family History  Problem Relation Age of Onset  . Asthma Mother   . Hypertension Mother     Social History Reviewed with no changes to be made today.   Outpatient Medications Prior to Visit  Medication Sig Dispense Refill  . albuterol (VENTOLIN HFA) 108 (90 Base) MCG/ACT inhaler Inhale 1-2 puffs into the lungs every 6 (six) hours as needed for wheezing or shortness of breath (coughing). 18 g 0  . Fluticasone-Salmeterol (ADVAIR DISKUS) 250-50 MCG/DOSE AEPB Inhale 1 puff into the lungs 2 (two) times daily. 60 each 6  . ipratropium-albuterol (DUONEB) 0.5-2.5 (3) MG/3ML SOLN Take 3 mLs by nebulization every 6 (six) hours as needed (sob/doe). 360 mL 2  . losartan-hydrochlorothiazide (HYZAAR) 100-25 MG tablet Take  1 tablet by mouth daily. 90 tablet 1  . metoprolol succinate (TOPROL-XL) 50 MG 24 hr tablet Take 1 tablet (50 mg total) by mouth daily. 90 tablet 1  . sildenafil (VIAGRA) 100 MG tablet Take 1 tablet (100 mg total) by mouth daily as needed for erectile dysfunction. 10 tablet 1   No facility-administered medications prior to visit.    Allergies    Allergen Reactions  . Shrimp [Shellfish Allergy]     Lip swell up.        Objective:    BP 132/85 (BP Location: Right Arm, Patient Position: Sitting, Cuff Size: Large) Comment (Cuff Size): thigh cuff  Pulse (!) 105   Temp (!) 96.8 F (36 C) (Temporal)   Ht 5' 10" (1.778 m)   Wt (!) 383 lb (173.7 kg)   SpO2 94%   BMI 54.95 kg/m  Wt Readings from Last 3 Encounters:  03/09/19 (!) 383 lb (173.7 kg)  10/23/18 (!) 378 lb (171.5 kg)  06/10/18 (!) 365 lb (165.6 kg)    Physical Exam Vitals and nursing note reviewed.  Constitutional:      Appearance: He is well-developed.  HENT:     Head: Normocephalic and atraumatic.  Cardiovascular:     Rate and Rhythm: Regular rhythm. Tachycardia present.     Heart sounds: Normal heart sounds. No murmur. No friction rub. No gallop.   Pulmonary:     Effort: Pulmonary effort is normal. No tachypnea or respiratory distress.     Breath sounds: Normal breath sounds. No decreased breath sounds, wheezing, rhonchi or rales.  Chest:     Chest wall: No tenderness.  Abdominal:     General: Bowel sounds are normal.     Palpations: Abdomen is soft.     Hernia: A hernia is present. Hernia is present in the umbilical area.    Musculoskeletal:        General: Normal range of motion.     Cervical back: Normal range of motion.  Skin:    General: Skin is warm and dry.  Neurological:     Mental Status: He is alert and oriented to person, place, and time.     Coordination: Coordination normal.  Psychiatric:        Behavior: Behavior normal. Behavior is cooperative.        Thought Content: Thought content normal.        Judgment: Judgment normal.          Patient has been counseled extensively about nutrition and exercise as well as the importance of adherence with medications and regular follow-up. The patient was given clear instructions to go to ER or return to medical center if symptoms don't improve, worsen or new problems develop. The patient  verbalized understanding.   Follow-up: Return in about 3 months (around 06/06/2019) for physical .   Gildardo Pounds, FNP-BC [facility] and [place], [place]   03/20/2019, 10:50 PM

## 2019-03-10 LAB — CBC
Hematocrit: 41.9 % (ref 37.5–51.0)
Hemoglobin: 14 g/dL (ref 13.0–17.7)
MCH: 27.6 pg (ref 26.6–33.0)
MCHC: 33.4 g/dL (ref 31.5–35.7)
MCV: 83 fL (ref 79–97)
Platelets: 370 10*3/uL (ref 150–450)
RBC: 5.08 x10E6/uL (ref 4.14–5.80)
RDW: 13.9 % (ref 11.6–15.4)
WBC: 8.4 10*3/uL (ref 3.4–10.8)

## 2019-03-10 LAB — CMP14+EGFR
ALT: 16 IU/L (ref 0–44)
AST: 12 IU/L (ref 0–40)
Albumin/Globulin Ratio: 1.3 (ref 1.2–2.2)
Albumin: 4 g/dL (ref 4.0–5.0)
Alkaline Phosphatase: 75 IU/L (ref 39–117)
BUN/Creatinine Ratio: 4 — ABNORMAL LOW (ref 9–20)
BUN: 5 mg/dL — ABNORMAL LOW (ref 6–24)
Bilirubin Total: 0.3 mg/dL (ref 0.0–1.2)
CO2: 23 mmol/L (ref 20–29)
Calcium: 9.4 mg/dL (ref 8.7–10.2)
Chloride: 103 mmol/L (ref 96–106)
Creatinine, Ser: 1.16 mg/dL (ref 0.76–1.27)
GFR calc Af Amer: 87 mL/min/{1.73_m2} (ref >59)
GFR calc non Af Amer: 75 mL/min/{1.73_m2} (ref >59)
Globulin, Total: 3 g/dL (ref 1.5–4.5)
Glucose: 89 mg/dL (ref 65–99)
Potassium: 4.2 mmol/L (ref 3.5–5.2)
Sodium: 142 mmol/L (ref 134–144)
Total Protein: 7 g/dL (ref 6.0–8.5)

## 2019-03-10 LAB — LIPID PANEL
Chol/HDL Ratio: 4.2 ratio (ref 0.0–5.0)
Cholesterol, Total: 167 mg/dL (ref 100–199)
HDL: 40 mg/dL (ref >39)
LDL Chol Calc (NIH): 98 mg/dL (ref 0–99)
Triglycerides: 169 mg/dL — ABNORMAL HIGH (ref 0–149)
VLDL Cholesterol Cal: 29 mg/dL (ref 5–40)

## 2019-03-10 MED FILL — LOSARTAN-HCTZ 100-25 MG TAB: 100-25 | 30 days supply | Qty: 30 | Fill #0

## 2019-03-10 MED FILL — IPRAT-ALBUT 0.5-3(2.5) MG/3: 0.5-2.5 (3) | 30 days supply | Qty: 360 | Fill #0

## 2019-03-10 MED FILL — SILDENAFIL CITRATE 100 MG T: 100 | 30 days supply | Qty: 5 | Fill #0

## 2019-03-10 MED FILL — METOPROLOL SUCCINATE ER 50: 50 | 30 days supply | Qty: 30 | Fill #0

## 2019-03-10 MED FILL — ALBUTEROL SULFATE HFA 108 (: 108 (90 BAS | 25 days supply | Qty: 18 | Fill #0

## 2019-03-10 MED FILL — FLUTICASONE-SALMETEROL 250-: 250-50 | 30 days supply | Qty: 60 | Fill #0

## 2019-03-20 ENCOUNTER — Encounter: Payer: Self-pay | Admitting: Nurse Practitioner

## 2019-05-13 ENCOUNTER — Other Ambulatory Visit: Payer: Self-pay | Admitting: Nurse Practitioner

## 2019-05-13 DIAGNOSIS — J449 Chronic obstructive pulmonary disease, unspecified: Secondary | ICD-10-CM

## 2019-05-13 MED FILL — LOSARTAN-HCTZ 100-25 MG TAB: 100-25 | 30 days supply | Qty: 30 | Fill #1

## 2019-05-13 MED FILL — METOPROLOL SUCCINATE ER 50: 50 | 30 days supply | Qty: 30 | Fill #1

## 2019-05-13 MED FILL — SILDENAFIL CITRATE 100 MG T: 100 | 30 days supply | Qty: 5 | Fill #1

## 2019-05-13 MED FILL — IPRAT-ALBUT 0.5-3(2.5) MG/3: 0.5-2.5 (3) | 30 days supply | Qty: 360 | Fill #1

## 2019-05-13 MED FILL — $ADVAIR 250/50 MCG INHALER: 250-50 | 30 days supply | Qty: 60 | Fill #1

## 2019-05-14 MED FILL — !VENTOLIN HFA INHALER: 108 (90 BAS | 25 days supply | Qty: 18 | Fill #0

## 2019-06-07 ENCOUNTER — Other Ambulatory Visit: Payer: Self-pay

## 2019-06-07 ENCOUNTER — Ambulatory Visit: Payer: Self-pay | Attending: Nurse Practitioner | Admitting: Nurse Practitioner

## 2019-06-07 ENCOUNTER — Encounter: Payer: Self-pay | Admitting: Nurse Practitioner

## 2019-06-07 ENCOUNTER — Other Ambulatory Visit: Payer: Self-pay | Admitting: Nurse Practitioner

## 2019-06-07 VITALS — BP 122/74 | HR 96 | Temp 97.7°F | Ht 70.0 in | Wt 388.0 lb

## 2019-06-07 DIAGNOSIS — Z6841 Body Mass Index (BMI) 40.0 and over, adult: Secondary | ICD-10-CM

## 2019-06-07 DIAGNOSIS — Z0000 Encounter for general adult medical examination without abnormal findings: Secondary | ICD-10-CM

## 2019-06-07 DIAGNOSIS — R000 Tachycardia, unspecified: Secondary | ICD-10-CM

## 2019-06-07 DIAGNOSIS — I83018 Varicose veins of right lower extremity with ulcer other part of lower leg: Secondary | ICD-10-CM

## 2019-06-07 DIAGNOSIS — R7303 Prediabetes: Secondary | ICD-10-CM

## 2019-06-07 DIAGNOSIS — I10 Essential (primary) hypertension: Secondary | ICD-10-CM

## 2019-06-07 DIAGNOSIS — N529 Male erectile dysfunction, unspecified: Secondary | ICD-10-CM

## 2019-06-07 DIAGNOSIS — R350 Frequency of micturition: Secondary | ICD-10-CM

## 2019-06-07 DIAGNOSIS — L97811 Non-pressure chronic ulcer of other part of right lower leg limited to breakdown of skin: Secondary | ICD-10-CM

## 2019-06-07 DIAGNOSIS — J449 Chronic obstructive pulmonary disease, unspecified: Secondary | ICD-10-CM

## 2019-06-07 DIAGNOSIS — G4733 Obstructive sleep apnea (adult) (pediatric): Secondary | ICD-10-CM

## 2019-06-07 LAB — POCT URINALYSIS DIP (CLINITEK)
Bilirubin, UA: NEGATIVE
Glucose, UA: NEGATIVE mg/dL
Ketones, POC UA: NEGATIVE mg/dL
Leukocytes, UA: NEGATIVE
Nitrite, UA: NEGATIVE
POC PROTEIN,UA: NEGATIVE
Spec Grav, UA: 1.025 (ref 1.010–1.025)
Urobilinogen, UA: 0.2 E.U./dL
pH, UA: 5.5 (ref 5.0–8.0)

## 2019-06-07 LAB — POCT GLYCOSYLATED HEMOGLOBIN (HGB A1C): Hemoglobin A1C: 5.6 % (ref 4.0–5.6)

## 2019-06-07 LAB — GLUCOSE, POCT (MANUAL RESULT ENTRY): POC Glucose: 108 mg/dl — AB (ref 70–99)

## 2019-06-07 MED ORDER — LOSARTAN POTASSIUM-HCTZ 100-25 MG PO TABS: 1.0000 | ORAL_TABLET | Freq: Every day | ORAL | 1 refills | Status: DC

## 2019-06-07 MED ORDER — METOPROLOL SUCCINATE ER 50 MG PO TB24: 50.0000 mg | ORAL_TABLET | Freq: Every day | ORAL | 1 refills | Status: DC

## 2019-06-07 MED ORDER — PHENTERMINE HCL 37.5 MG PO TABS: 37.5000 mg | ORAL_TABLET | Freq: Every day | ORAL | 0 refills | Status: DC

## 2019-06-07 MED ORDER — FLUTICASONE-SALMETEROL 250-50 MCG/DOSE IN AEPB: 1.0000 | INHALATION_SPRAY | Freq: Two times a day (BID) | RESPIRATORY_TRACT | 0 refills | Status: DC

## 2019-06-07 MED ORDER — SILDENAFIL CITRATE 100 MG PO TABS: 100.0000 mg | ORAL_TABLET | Freq: Every day | ORAL | 1 refills | Status: DC | PRN

## 2019-06-07 MED ORDER — CETIRIZINE HCL 10 MG PO TABS: 10.0000 mg | ORAL_TABLET | Freq: Every day | ORAL | 3 refills | Status: DC

## 2019-06-07 MED ORDER — PREDNISONE 20 MG PO TABS: 20.0000 mg | ORAL_TABLET | Freq: Every day | ORAL | 0 refills | Status: AC

## 2019-06-07 NOTE — Progress Notes (Signed)
Assessment & Plan:  Samuel [name] was seen today for annual exam.  Diagnoses and all orders for this visit:   [place] hypertension Well controlled -     losartan-hydrochlorothiazide (HYZAAR) 100-25 MG tablet; Take 1 tablet by mouth daily. Please fill as a 90 day supply -     Basic metabolic panel -     metoprolol succinate (TOPROL-XL) 50 MG 24 hr tablet; Take 1 tablet (50 mg total) by mouth daily. Please fill as a 90 day supply Continue all antihypertensives as prescribed.  Remember to bring in your blood pressure log with you for your follow up appointment.  DASH/Mediterranean Diets are healthier choices for HTN.    Erectile dysfunction, unspecified erectile dysfunction type -     sildenafil (VIAGRA) 100 MG tablet; Take 1 tablet (100 mg total) by mouth daily as needed for erectile dysfunction.  COPD mixed type ([place]) -     predniSONE (DELTASONE) 20 MG tablet; Take 1 tablet (20 mg total) by mouth daily with breakfast for 5 days. -     Split night study; Future -     Fluticasone-Salmeterol (ADVAIR DISKUS) 250-50 MCG/DOSE AEPB; Inhale 1 puff into the lungs 2 (two) times daily. Please fill as a 90 day supply  OSA (obstructive sleep apnea) -     Split night study; Future  Increased urinary frequency -     PSA -     POCT URINALYSIS DIP (CLINITEK) -     HgB A1c  Prediabetes -     Glucose (CBG)  Tachycardia -     metoprolol succinate (TOPROL-XL) 50 MG 24 hr tablet; Take 1 tablet (50 mg total) by mouth daily. Please fill as a 90 day supply  Morbid obesity with BMI of 50.0-59.9, adult ([place]) -     Split night study; Future -     phentermine (ADIPEX-P) 37.5 MG tablet; Take 1 tablet (37.5 mg total) by mouth daily before breakfast. Discussed diet and exercise. Instructed: You must burn more calories than you eat. Losing 5 percent of your body weight should be considered a success. In the longer term, losing more than 15 percent of your body weight and staying at this  weight is an extremely good result. However, keep in mind that even losing 5 percent of your body weight leads to important health benefits, so try not to get discouraged if you're not able to lose more than this. Will recheck weight in 2 months  Venous stasis ulcer of other part of right lower leg limited to breakdown of skin, unspecified whether varicose veins present (HCC) No venous ulcers present.   Other orders -     cetirizine (ZYRTEC) 10 MG tablet; Take 1 tablet (10 mg total) by mouth daily. Please fill as a 90 day supply    Patient has been counseled on age-appropriate routine health concerns for screening and prevention. These are reviewed and up-to-date. Referrals have been placed accordingly. Immunizations are up-to-date or declined.    Subjective:   Chief Complaint  Patient presents with  . Annual Exam    Pt. is here for a physical.    [name] 49 y.o. male presents to office today   Peeing more at night. 4-5 times.    Essential Hypertension BP Readings from Last 3 Encounters:  06/07/19 122/74  03/09/19 132/85  10/23/18 (!) 139/91   Prediabetes  Review of Systems  Constitutional: Negative for fever, malaise/fatigue and weight loss.  HENT: Negative.  Negative  for nosebleeds.   Eyes: Negative.  Negative for blurred vision, double vision and photophobia.  Respiratory: Positive for shortness of breath and wheezing. Negative for cough.   Cardiovascular: Negative.  Negative for chest pain, palpitations and leg swelling.  Gastrointestinal: Negative.  Negative for heartburn, nausea and vomiting.  Genitourinary: Positive for frequency.       ED  Musculoskeletal: Negative.  Negative for myalgias.  Skin: Negative.   Neurological: Negative.  Negative for dizziness, focal weakness, seizures and headaches.  Endo/Heme/Allergies: Positive for environmental allergies.  Psychiatric/Behavioral: Negative.  Negative for suicidal ideas.    Past Medical History:    Diagnosis Date  . Asthma   . Hypertension   . Prediabetes   . Ulcer    venous insufficiency    History reviewed. No pertinent surgical history.  Family History  Problem Relation Age of Onset  . Asthma Mother   . Hypertension Mother     Social History Reviewed with no changes to be made today.   Outpatient Medications Prior to Visit  Medication Sig Dispense Refill  . albuterol (VENTOLIN HFA) 108 (90 Base) MCG/ACT inhaler INHALE 1-2 PUFFS INTO THE LUNGS EVERY 6 (SIX) HOURS AS NEEDED FOR WHEEZING OR SHORTNESS OF BREATH (COUGHING). 18 g 0  . ipratropium-albuterol (DUONEB) 0.5-2.5 (3) MG/3ML SOLN Take 3 mLs by nebulization every 6 (six) hours as needed (sob/doe). 360 mL 2  . Fluticasone-Salmeterol (ADVAIR DISKUS) 250-50 MCG/DOSE AEPB Inhale 1 puff into the lungs 2 (two) times daily. 60 each 6  . losartan-hydrochlorothiazide (HYZAAR) 100-25 MG tablet Take 1 tablet by mouth daily. 90 tablet 1  . metoprolol succinate (TOPROL-XL) 50 MG 24 hr tablet Take 1 tablet (50 mg total) by mouth daily. 90 tablet 1  . sildenafil (VIAGRA) 100 MG tablet Take 1 tablet (100 mg total) by mouth daily as needed for erectile dysfunction. 10 tablet 1   No facility-administered medications prior to visit.    Allergies  Allergen Reactions  . Shrimp [Shellfish Allergy]     Lip swell up.        Objective:    BP 122/74 (BP Location: Left Arm, Patient Position: Sitting, Cuff Size: Large) Comment (Cuff Size): thigh cuff  Pulse 96   Temp 97.7 F (36.5 C) (Temporal)   Ht 5\' 10"  (1.778 m)   Wt (!) 388 lb (176 kg)   SpO2 93%   BMI 55.67 kg/m  Wt Readings from Last 3 Encounters:  06/07/19 (!) 388 lb (176 kg)  03/09/19 (!) 383 lb (173.7 kg)  10/23/18 (!) 378 lb (171.5 kg)    Physical Exam Vitals and nursing note reviewed.  Constitutional:      Appearance: He is well-developed.  HENT:     Head: Normocephalic and atraumatic.     Right Ear: Hearing, tympanic membrane, ear canal and external ear  normal.     Left Ear: Hearing, tympanic membrane, ear canal and external ear normal.     Nose: Nose normal. No mucosal edema or rhinorrhea.     Mouth/Throat:     Pharynx: Uvula midline.     Tonsils: No tonsillar exudate. 1+ on the right. 1+ on the left.  Eyes:     General: Lids are normal. No scleral icterus.    Conjunctiva/sclera: Conjunctivae normal.     Pupils: Pupils are equal, round, and reactive to light.  Neck:     Thyroid: No thyromegaly.     Trachea: No tracheal deviation.  Cardiovascular:     Rate and Rhythm: Normal  rate and regular rhythm.     Heart sounds: Normal heart sounds. No murmur. No friction rub. No gallop.   Pulmonary:     Effort: Pulmonary effort is normal. No tachypnea or respiratory distress.     Breath sounds: Wheezing present. No decreased breath sounds, rhonchi or rales.  Chest:     Chest wall: No mass or tenderness.     Breasts:        Right: No inverted nipple, mass, nipple discharge, skin change or tenderness.        Left: No inverted nipple, mass, nipple discharge, skin change or tenderness.  Abdominal:     General: Bowel sounds are normal. There is no distension.     Palpations: Abdomen is soft. There is no mass.     Tenderness: There is no abdominal tenderness. There is no guarding or rebound.     Hernia: There is no hernia in the left inguinal area.  Musculoskeletal:        General: No tenderness or deformity. Normal range of motion.     Cervical back: Normal range of motion and neck supple.     Right lower leg: Edema present.     Left lower leg: Edema present.     Comments: Non pitting edema present bilaterally   Lymphadenopathy:     Cervical: No cervical adenopathy.     Lower Body: No right inguinal adenopathy. No left inguinal adenopathy.  Skin:    General: Skin is warm and dry.     Capillary Refill: Capillary refill takes less than 2 seconds.     Findings: No erythema.  Neurological:     Mental Status: He is alert and oriented to  person, place, and time.     Cranial Nerves: No cranial nerve deficit.     Motor: No abnormal muscle tone.     Coordination: Coordination normal.     Deep Tendon Reflexes: Reflexes normal.     Reflex Scores:      Patellar reflexes are 1+ on the right side and 1+ on the left side. Psychiatric:        Behavior: Behavior normal. Behavior is cooperative.        Thought Content: Thought content normal.        Judgment: Judgment normal.          Patient has been counseled extensively about nutrition and exercise as well as the importance of adherence with medications and regular follow-up. The patient was given clear instructions to go to ER or return to medical center if symptoms don't improve, worsen or new problems develop. The patient verbalized understanding.   Follow-up: Return in about 3 months (around 09/07/2019).   Claiborne Rigg, FNP-BC [facility] and [facility] [place], [place] [phone]

## 2019-06-08 LAB — BASIC METABOLIC PANEL
BUN/Creatinine Ratio: 11 (ref 9–20)
BUN: 11 mg/dL (ref 6–24)
CO2: 24 mmol/L (ref 20–29)
Calcium: 9.4 mg/dL (ref 8.7–10.2)
Chloride: 100 mmol/L (ref 96–106)
Creatinine, Ser: 0.99 mg/dL (ref 0.76–1.27)
GFR calc Af Amer: 104 mL/min/{1.73_m2} (ref >59)
GFR calc non Af Amer: 90 mL/min/{1.73_m2} (ref >59)
Glucose: 92 mg/dL (ref 65–99)
Potassium: 4.3 mmol/L (ref 3.5–5.2)
Sodium: 140 mmol/L (ref 134–144)

## 2019-06-08 LAB — PSA: Prostate Specific Ag, Serum: 0.4 ng/mL (ref 0.0–4.0)

## 2019-06-18 MED FILL — predniSONE 20 MG TABS: 20 | 5 days supply | Qty: 5 | Fill #0

## 2019-06-25 MED FILL — METOPROLOL SUCCINATE ER 50: 50 | 30 days supply | Qty: 30 | Fill #0

## 2019-06-25 MED FILL — LOSARTAN-HCTZ 100-25 MG TAB: 100-25 | 30 days supply | Qty: 30 | Fill #0

## 2019-06-25 MED FILL — $ADVAIR 250/50 MCG INHALER: 250-50 | 30 days supply | Qty: 60 | Fill #0

## 2019-06-25 MED FILL — SILDENAFIL CITRATE 100 MG T: 100 | 30 days supply | Qty: 10 | Fill #0

## 2019-06-26 ENCOUNTER — Encounter: Payer: Self-pay | Admitting: Nurse Practitioner

## 2019-07-01 ENCOUNTER — Other Ambulatory Visit: Payer: Self-pay | Admitting: Nurse Practitioner

## 2019-07-01 DIAGNOSIS — J449 Chronic obstructive pulmonary disease, unspecified: Secondary | ICD-10-CM

## 2019-07-01 MED FILL — $VENTOLIN HFA 18G INHALER: 108 (90 BAS | 25 days supply | Qty: 18 | Fill #0

## 2019-07-20 ENCOUNTER — Encounter: Payer: Self-pay | Admitting: Nurse Practitioner

## 2019-07-20 ENCOUNTER — Ambulatory Visit: Payer: Self-pay | Attending: Nurse Practitioner | Admitting: Nurse Practitioner

## 2019-07-20 ENCOUNTER — Other Ambulatory Visit: Payer: Self-pay

## 2019-07-20 VITALS — BP 155/95 | HR 101 | Temp 97.7°F | Ht 70.0 in | Wt 383.0 lb

## 2019-07-20 DIAGNOSIS — Z6841 Body Mass Index (BMI) 40.0 and over, adult: Secondary | ICD-10-CM

## 2019-07-20 DIAGNOSIS — J449 Chronic obstructive pulmonary disease, unspecified: Secondary | ICD-10-CM

## 2019-07-20 DIAGNOSIS — R7303 Prediabetes: Secondary | ICD-10-CM

## 2019-07-20 LAB — GLUCOSE, POCT (MANUAL RESULT ENTRY): POC Glucose: 110 mg/dl — AB (ref 70–99)

## 2019-07-20 MED ORDER — PHENTERMINE HCL 37.5 MG PO TABS: 37.5000 mg | ORAL_TABLET | Freq: Every day | ORAL | 2 refills | Status: DC

## 2019-07-20 MED ORDER — ALBUTEROL SULFATE HFA 108 (90 BASE) MCG/ACT IN AERS: 1.0000 | INHALATION_SPRAY | Freq: Four times a day (QID) | RESPIRATORY_TRACT | 1 refills | Status: DC | PRN

## 2019-07-20 MED ORDER — FLUTICASONE-SALMETEROL 250-50 MCG/DOSE IN AEPB: 1.0000 | INHALATION_SPRAY | Freq: Two times a day (BID) | RESPIRATORY_TRACT | 0 refills | Status: DC

## 2019-07-20 MED FILL — $VENTOLIN HFA 18G INHALER: 108 (90 BAS | 25 days supply | Qty: 18 | Fill #0

## 2019-07-20 MED FILL — LOSARTAN-HCTZ 100-25 MG TAB: 100-25 | 30 days supply | Qty: 30 | Fill #1

## 2019-07-20 MED FILL — $ADVAIR 250/50 MCG INHALER: 250-50 | 90 days supply | Qty: 180 | Fill #0

## 2019-07-20 MED FILL — METOPROLOL SUCCINATE ER 50: 50 | 30 days supply | Qty: 30 | Fill #1

## 2019-07-20 NOTE — Progress Notes (Signed)
Assessment & Plan:  Samuel [name] was seen today for follow-up.  Diagnoses and all orders for this visit:  Morbid obesity with BMI of 50.0-59.9, adult (HCC) -     phentermine (ADIPEX-P) 37.5 MG tablet; Take 1 tablet (37.5 mg total) by mouth daily before breakfast.  Prediabetes -     Glucose (CBG)  COPD mixed type (HCC) -     Fluticasone-Salmeterol (ADVAIR DISKUS) 250-50 MCG/DOSE AEPB; Inhale 1 puff into the lungs 2 (two) times daily. Please fill as a 90 day supply -     albuterol (VENTOLIN HFA) 108 (90 Base) MCG/ACT inhaler; Inhale 1-2 puffs into the lungs every 6 (six) hours as needed for wheezing or shortness of breath.    Patient has been counseled on age-appropriate routine health concerns for screening and prevention. These are reviewed and up-to-date. Referrals have been placed accordingly. Immunizations are up-to-date or declined.    Subjective:   Chief Complaint  Patient presents with  . Follow-up    Pt. is here for weight check follow up.    [name] 49 y.o. male presents to office today for weight check.  Has lost 5 lbs since 06-07-2019. Blood pressure and heart rate are normal. Denies chest pain, shortness of breath, palpitations, lightheadedness, dizziness, headaches or BLE edema. Will continue on phentermine at this time.  Wt Readings from Last 3 Encounters:  07/20/19 (!) 383 lb (173.7 kg)  06/07/19 (!) 388 lb (176 kg)  03/09/19 (!) 383 lb (173.7 kg)   BP Readings from Last 3 Encounters:  07/20/19 (!) 155/95  06/07/19 122/74  03/09/19 132/85    Review of Systems  Constitutional: Negative for fever, malaise/fatigue and weight loss.  HENT: Negative.  Negative for nosebleeds.   Eyes: Negative.  Negative for blurred vision, double vision and photophobia.  Respiratory: Negative.  Negative for cough and shortness of breath.   Cardiovascular: Negative.  Negative for chest pain, palpitations and leg swelling.  Gastrointestinal: Negative.  Negative for  heartburn, nausea and vomiting.  Musculoskeletal: Negative.  Negative for myalgias.  Neurological: Negative.  Negative for dizziness, focal weakness, seizures and headaches.  Psychiatric/Behavioral: Negative.  Negative for suicidal ideas.    Past Medical History:  Diagnosis Date  . Asthma   . Hypertension   . Prediabetes   . Ulcer    venous insufficiency    History reviewed. No pertinent surgical history.  Family History  Problem Relation Age of Onset  . Asthma Mother   . Hypertension Mother     Social History Reviewed with no changes to be made today.   Outpatient Medications Prior to Visit  Medication Sig Dispense Refill  . cetirizine (ZYRTEC) 10 MG tablet Take 1 tablet (10 mg total) by mouth daily. Please fill as a 90 day supply 90 tablet 3  . ipratropium-albuterol (DUONEB) 0.5-2.5 (3) MG/3ML SOLN Take 3 mLs by nebulization every 6 (six) hours as needed (sob/doe). 360 mL 2  . losartan-hydrochlorothiazide (HYZAAR) 100-25 MG tablet Take 1 tablet by mouth daily. Please fill as a 90 day supply 90 tablet 1  . metoprolol succinate (TOPROL-XL) 50 MG 24 hr tablet Take 1 tablet (50 mg total) by mouth daily. Please fill as a 90 day supply 90 tablet 1  . Fluticasone-Salmeterol (ADVAIR DISKUS) 250-50 MCG/DOSE AEPB Inhale 1 puff into the lungs 2 (two) times daily. Please fill as a 90 day supply 180 each 0  . VENTOLIN HFA 108 (90 Base) MCG/ACT inhaler INHALE 1-2 PUFFS INTO THE LUNGS EVERY  6 (SIX) HOURS AS NEEDED FOR WHEEZING OR SHORTNESS OF BREATH (COUGHING). 18 g 0  . sildenafil (VIAGRA) 100 MG tablet Take 1 tablet (100 mg total) by mouth daily as needed for erectile dysfunction. 10 tablet 1  . phentermine (ADIPEX-P) 37.5 MG tablet Take 1 tablet (37.5 mg total) by mouth daily before breakfast. 30 tablet 0   No facility-administered medications prior to visit.    Allergies  Allergen Reactions  . Shrimp [Shellfish Allergy]     Lip swell up.        Objective:    BP (!) 155/95 (BP  Location: Left Arm, Patient Position: Sitting, Cuff Size: Large)   Pulse (!) 101   Temp 97.7 F (36.5 C) (Temporal)   Ht 5\' 10"  (1.778 m)   Wt (!) 383 lb (173.7 kg)   SpO2 95%   BMI 54.95 kg/m  Wt Readings from Last 3 Encounters:  07/20/19 (!) 383 lb (173.7 kg)  06/07/19 (!) 388 lb (176 kg)  03/09/19 (!) 383 lb (173.7 kg)    Physical Exam Vitals and nursing note reviewed.  Constitutional:      Appearance: He is well-developed.  HENT:     Head: Normocephalic and atraumatic.  Cardiovascular:     Rate and Rhythm: Normal rate and regular rhythm.     Heart sounds: Normal heart sounds. No murmur heard.  No friction rub. No gallop.   Pulmonary:     Effort: Pulmonary effort is normal. No tachypnea or respiratory distress.     Breath sounds: Normal breath sounds. No decreased breath sounds, wheezing, rhonchi or rales.  Chest:     Chest wall: No tenderness.  Abdominal:     General: Bowel sounds are normal.     Palpations: Abdomen is soft.  Musculoskeletal:        General: Normal range of motion.     Cervical back: Normal range of motion.  Skin:    General: Skin is warm and dry.  Neurological:     Mental Status: He is alert and oriented to person, place, and time.     Coordination: Coordination normal.  Psychiatric:        Behavior: Behavior normal. Behavior is cooperative.        Thought Content: Thought content normal.        Judgment: Judgment normal.          Patient has been counseled extensively about nutrition and exercise as well as the importance of adherence with medications and regular follow-up. The patient was given clear instructions to go to ER or return to medical center if symptoms don't improve, worsen or new problems develop. The patient verbalized understanding.   Follow-up: Return in about 3 months (around 10/20/2019) for weight check.   10/22/2019, FNP-BC [facility] and [facility] [place], [place]   07/20/2019,  9:00 AM

## 2019-07-24 ENCOUNTER — Other Ambulatory Visit (HOSPITAL_COMMUNITY): Payer: Self-pay

## 2019-07-26 ENCOUNTER — Encounter (HOSPITAL_BASED_OUTPATIENT_CLINIC_OR_DEPARTMENT_OTHER): Payer: Self-pay | Admitting: Internal Medicine

## 2019-07-27 MED FILL — predniSONE 20 MG TABS: 20 | 5 days supply | Qty: 5 | Fill #0

## 2019-10-14 MED FILL — METOPROLOL SUCCINATE ER 50: 50 | 30 days supply | Qty: 30 | Fill #2

## 2019-10-14 MED FILL — LOSARTAN-HCTZ 100-25 MG TAB: 100-25 | 30 days supply | Qty: 30 | Fill #2

## 2019-10-14 MED FILL — $VENTOLIN HFA 18G INHALER: 108 (90 BAS | 25 days supply | Qty: 18 | Fill #1

## 2019-10-14 MED FILL — IPRAT-ALBUT 0.5-3(2.5) MG/3: 0.5-2.5 (3) | 30 days supply | Qty: 360 | Fill #2

## 2019-10-14 MED FILL — SILDENAFIL CITRATE 100 MG T: 100 | 30 days supply | Qty: 10 | Fill #1

## 2019-10-14 MED FILL — predniSONE 20 MG TABS: 20 | 5 days supply | Qty: 5 | Fill #0

## 2019-10-20 ENCOUNTER — Ambulatory Visit: Payer: Self-pay | Admitting: Nurse Practitioner

## 2019-10-22 MED FILL — METOPROLOL SUCCINATE ER 50: 50 | 30 days supply | Qty: 30 | Fill #2

## 2019-10-22 MED FILL — LOSARTAN-HCTZ 100-25 MG TAB: 100-25 | 30 days supply | Qty: 30 | Fill #2

## 2019-10-22 MED FILL — $ADVAIR 250/50 MCG INHALER: 250-50 | 60 days supply | Qty: 120 | Fill #1

## 2019-10-22 MED FILL — IPRAT-ALBUT 0.5-3(2.5) MG/3: 0.5-2.5 (3) | 30 days supply | Qty: 360 | Fill #2

## 2019-10-22 MED FILL — $VENTOLIN HFA 18G INHALER: 108 (90 BAS | 25 days supply | Qty: 18 | Fill #1

## 2019-10-22 MED FILL — predniSONE 20 MG TABS: 20 | 5 days supply | Qty: 5 | Fill #0

## 2019-12-21 ENCOUNTER — Other Ambulatory Visit: Payer: Self-pay | Admitting: Nurse Practitioner

## 2019-12-21 DIAGNOSIS — J449 Chronic obstructive pulmonary disease, unspecified: Secondary | ICD-10-CM

## 2019-12-21 MED FILL — SILDENAFIL CITRATE 100 MG T: 100 | 30 days supply | Qty: 10 | Fill #1

## 2019-12-21 MED FILL — METOPROLOL SUCCINATE ER 50: 50 | 30 days supply | Qty: 30 | Fill #3

## 2019-12-21 MED FILL — $VENTOLIN HFA 18G INHALER: 108 (90 BAS | 25 days supply | Qty: 18 | Fill #0

## 2019-12-21 MED FILL — LOSARTAN-HCTZ 100-25 MG TAB: 100-25 | 30 days supply | Qty: 30 | Fill #3

## 2019-12-31 ENCOUNTER — Other Ambulatory Visit: Payer: Self-pay | Admitting: Nurse Practitioner

## 2019-12-31 DIAGNOSIS — J449 Chronic obstructive pulmonary disease, unspecified: Secondary | ICD-10-CM

## 2019-12-31 MED FILL — $VENTOLIN HFA 18G INHALER: 108 (90 BAS | 25 days supply | Qty: 18 | Fill #0

## 2019-12-31 MED FILL — IPRAT-ALBUT 0.5-3(2.5) MG/3: 0.5-2.5 (3) | 30 days supply | Qty: 360 | Fill #0

## 2019-12-31 MED FILL — SILDENAFIL CITRATE 100 MG T: 100 | 30 days supply | Qty: 10 | Fill #1

## 2019-12-31 MED FILL — LOSARTAN-HCTZ 100-25 MG TAB: 100-25 | 30 days supply | Qty: 30 | Fill #3

## 2019-12-31 MED FILL — $ADVAIR 250/50 MCG INHALER: 250-50 | 30 days supply | Qty: 60 | Fill #1

## 2019-12-31 NOTE — Telephone Encounter (Signed)
Requested medication (s) are due for refill today: no  Requested medication (s) are on the active medication list: yes   Last refill: 10/22/2018  Future visit scheduled: no  Notes to clinic:  This refill cannot be delegated   Requested Prescriptions  Pending Prescriptions Disp Refills   predniSONE (DELTASONE) 20 MG tablet [Pharmacy Med Name: predniSONE 20 MG TABS 20 Tablet] 5 tablet 0    Sig: Take 1 tablet (20 mg total) by mouth daily with breakfast for 5 days.      Not Delegated - Endocrinology:  Oral Corticosteroids Failed - 12/31/2019  8:53 AM      Failed - This refill cannot be delegated      Failed - Last BP in normal range    BP Readings from Last 1 Encounters:  07/20/19 (!) 155/95          Passed - Valid encounter within last 6 months    Recent Outpatient Visits           5 months ago Morbid obesity with BMI of 50.0-59.9, adult [facility])   [facility] [place], Shea Stakes, NP   6 months ago Encounter for annual physical exam   [facility] [place], Shea Stakes, NP   9 months ago Essential hypertension   [facility] [place], Shea Stakes, NP   1 year ago Essential hypertension   [facility] [place], Shea Stakes, NP   2 years ago Essential hypertension   [facility] [place], Shea Stakes, NP

## 2020-01-27 ENCOUNTER — Other Ambulatory Visit: Payer: Self-pay | Admitting: Nurse Practitioner

## 2020-01-27 DIAGNOSIS — Z6841 Body Mass Index (BMI) 40.0 and over, adult: Secondary | ICD-10-CM

## 2020-02-14 ENCOUNTER — Other Ambulatory Visit: Payer: Self-pay | Admitting: Nurse Practitioner

## 2020-02-14 ENCOUNTER — Telehealth: Payer: Self-pay | Admitting: Nurse Practitioner

## 2020-02-14 DIAGNOSIS — I10 Essential (primary) hypertension: Secondary | ICD-10-CM

## 2020-02-14 DIAGNOSIS — J449 Chronic obstructive pulmonary disease, unspecified: Secondary | ICD-10-CM

## 2020-02-14 DIAGNOSIS — Z6841 Body Mass Index (BMI) 40.0 and over, adult: Secondary | ICD-10-CM

## 2020-02-14 DIAGNOSIS — R000 Tachycardia, unspecified: Secondary | ICD-10-CM

## 2020-02-14 DIAGNOSIS — N529 Male erectile dysfunction, unspecified: Secondary | ICD-10-CM

## 2020-02-14 NOTE — Telephone Encounter (Signed)
Called patient and LVM alerting patient that due to the inclement weather miss Samuel [name] will be working virtually tomorrow and his physical has been cancelled and needs to be rescheduled. Advised patient to call [phone] to reschedule.

## 2020-02-15 ENCOUNTER — Encounter: Payer: Self-pay | Admitting: Nurse Practitioner

## 2020-02-16 ENCOUNTER — Other Ambulatory Visit: Payer: Self-pay | Admitting: Nurse Practitioner

## 2020-02-16 DIAGNOSIS — R000 Tachycardia, unspecified: Secondary | ICD-10-CM

## 2020-02-16 DIAGNOSIS — N529 Male erectile dysfunction, unspecified: Secondary | ICD-10-CM

## 2020-02-16 DIAGNOSIS — I10 Essential (primary) hypertension: Secondary | ICD-10-CM

## 2020-02-16 DIAGNOSIS — J449 Chronic obstructive pulmonary disease, unspecified: Secondary | ICD-10-CM

## 2020-02-16 MED ORDER — FLUTICASONE-SALMETEROL 250-50 MCG/DOSE IN AEPB: 1.0000 | INHALATION_SPRAY | Freq: Two times a day (BID) | RESPIRATORY_TRACT | 0 refills | Status: DC

## 2020-02-16 MED ORDER — SILDENAFIL CITRATE 100 MG PO TABS: 100.0000 mg | ORAL_TABLET | Freq: Every day | ORAL | 1 refills | Status: DC | PRN

## 2020-02-16 MED ORDER — METOPROLOL SUCCINATE ER 50 MG PO TB24
50.0000 mg | ORAL_TABLET | Freq: Every day | ORAL | 0 refills | Status: DC
Start: 2020-02-16 — End: 2020-04-18

## 2020-02-16 MED ORDER — ALBUTEROL SULFATE HFA 108 (90 BASE) MCG/ACT IN AERS
INHALATION_SPRAY | RESPIRATORY_TRACT | 0 refills | Status: DC
Start: 2020-02-16 — End: 2020-04-18

## 2020-02-16 MED ORDER — LOSARTAN POTASSIUM-HCTZ 100-25 MG PO TABS: 1.0000 | ORAL_TABLET | Freq: Every day | ORAL | 0 refills | Status: DC

## 2020-02-17 MED ORDER — PHENTERMINE HCL 37.5 MG PO TABS
ORAL_TABLET | ORAL | 0 refills | Status: DC
Start: 2020-02-16 — End: 2020-05-25

## 2020-02-17 MED FILL — SILDENAFIL CITRATE 100 MG T: 100 | 30 days supply | Qty: 10 | Fill #0

## 2020-02-17 MED FILL — ALBUTEROL SULFATE HFA 108 (: 108 (90 BAS | 25 days supply | Qty: 18 | Fill #0

## 2020-02-17 MED FILL — $ADVAIR 250/50 MCG INHALER: 250-50 | 90 days supply | Qty: 180 | Fill #0

## 2020-02-17 MED FILL — METOPROLOL SUCCINATE ER 50: 50 | 30 days supply | Qty: 30 | Fill #0

## 2020-02-17 MED FILL — LOSARTAN-HCTZ 100-25 MG TAB: 100-25 | 30 days supply | Qty: 30 | Fill #0

## 2020-04-17 ENCOUNTER — Other Ambulatory Visit: Payer: Self-pay | Admitting: Nurse Practitioner

## 2020-04-17 DIAGNOSIS — I10 Essential (primary) hypertension: Secondary | ICD-10-CM

## 2020-04-17 DIAGNOSIS — R000 Tachycardia, unspecified: Secondary | ICD-10-CM

## 2020-04-17 DIAGNOSIS — Z6841 Body Mass Index (BMI) 40.0 and over, adult: Secondary | ICD-10-CM

## 2020-04-17 DIAGNOSIS — J449 Chronic obstructive pulmonary disease, unspecified: Secondary | ICD-10-CM

## 2020-04-17 DIAGNOSIS — N529 Male erectile dysfunction, unspecified: Secondary | ICD-10-CM

## 2020-04-18 ENCOUNTER — Other Ambulatory Visit: Payer: Self-pay

## 2020-04-18 DIAGNOSIS — I10 Essential (primary) hypertension: Secondary | ICD-10-CM

## 2020-04-18 DIAGNOSIS — N529 Male erectile dysfunction, unspecified: Secondary | ICD-10-CM

## 2020-04-18 DIAGNOSIS — R000 Tachycardia, unspecified: Secondary | ICD-10-CM

## 2020-04-18 DIAGNOSIS — J449 Chronic obstructive pulmonary disease, unspecified: Secondary | ICD-10-CM

## 2020-04-18 MED ORDER — METOPROLOL SUCCINATE ER 50 MG PO TB24: 50.0000 mg | ORAL_TABLET | Freq: Every day | ORAL | 0 refills | Status: DC

## 2020-04-18 MED ORDER — CETIRIZINE HCL 10 MG PO TABS: 10.0000 mg | ORAL_TABLET | Freq: Every day | ORAL | 0 refills | Status: DC

## 2020-04-18 MED ORDER — LOSARTAN POTASSIUM-HCTZ 100-25 MG PO TABS: 1.0000 | ORAL_TABLET | Freq: Every day | ORAL | 0 refills | Status: DC

## 2020-04-18 MED ORDER — ALBUTEROL SULFATE HFA 108 (90 BASE) MCG/ACT IN AERS: INHALATION_SPRAY | RESPIRATORY_TRACT | 0 refills | Status: DC

## 2020-04-18 MED ORDER — FLUTICASONE-SALMETEROL 250-50 MCG/DOSE IN AEPB: 1.0000 | INHALATION_SPRAY | Freq: Two times a day (BID) | RESPIRATORY_TRACT | 0 refills | Status: DC

## 2020-04-18 MED ORDER — SILDENAFIL CITRATE 100 MG PO TABS: 100.0000 mg | ORAL_TABLET | Freq: Every day | ORAL | 0 refills | Status: DC | PRN

## 2020-04-18 MED FILL — LOSARTAN-HCTZ 100-25 MG TAB: 100-25 | 30 days supply | Qty: 30 | Fill #0

## 2020-04-18 MED FILL — SILDENAFIL CITRATE 100 MG T: 100 | 30 days supply | Qty: 10 | Fill #0

## 2020-04-18 MED FILL — METOPROLOL SUCCINATE ER 50: 50 | 30 days supply | Qty: 30 | Fill #0

## 2020-04-18 MED FILL — !VENTOLIN HFA INHALER: 108 (90 BAS | 25 days supply | Qty: 18 | Fill #0

## 2020-05-25 ENCOUNTER — Ambulatory Visit: Payer: Self-pay | Attending: Physician Assistant | Admitting: Physician Assistant

## 2020-05-25 ENCOUNTER — Encounter: Payer: Self-pay | Admitting: Physician Assistant

## 2020-05-25 ENCOUNTER — Other Ambulatory Visit: Payer: Self-pay

## 2020-05-25 VITALS — BP 183/79 | HR 100 | Resp 20 | Ht 71.0 in | Wt 391.8 lb

## 2020-05-25 DIAGNOSIS — Z6841 Body Mass Index (BMI) 40.0 and over, adult: Secondary | ICD-10-CM

## 2020-05-25 DIAGNOSIS — R7303 Prediabetes: Secondary | ICD-10-CM

## 2020-05-25 DIAGNOSIS — I10 Essential (primary) hypertension: Secondary | ICD-10-CM

## 2020-05-25 DIAGNOSIS — E785 Hyperlipidemia, unspecified: Secondary | ICD-10-CM

## 2020-05-25 DIAGNOSIS — N529 Male erectile dysfunction, unspecified: Secondary | ICD-10-CM

## 2020-05-25 DIAGNOSIS — J449 Chronic obstructive pulmonary disease, unspecified: Secondary | ICD-10-CM

## 2020-05-25 DIAGNOSIS — K429 Umbilical hernia without obstruction or gangrene: Secondary | ICD-10-CM

## 2020-05-25 DIAGNOSIS — R000 Tachycardia, unspecified: Secondary | ICD-10-CM

## 2020-05-25 LAB — POCT GLYCOSYLATED HEMOGLOBIN (HGB A1C): HbA1c, POC (controlled diabetic range): 5.7 % (ref 0.0–7.0)

## 2020-05-25 MED ORDER — ALBUTEROL SULFATE HFA 108 (90 BASE) MCG/ACT IN AERS
INHALATION_SPRAY | RESPIRATORY_TRACT | 0 refills | Status: DC
  Filled 2020-05-25 – 2020-06-02 (×2): qty 18, 25d supply, fill #0
  Filled 2020-06-15: qty 18, 30d supply, fill #0

## 2020-05-25 MED ORDER — SILDENAFIL CITRATE 100 MG PO TABS
ORAL_TABLET | ORAL | 0 refills | Status: DC
Start: 2020-05-25 — End: 2020-07-26
  Filled 2020-05-25 – 2020-06-15 (×3): qty 10, 30d supply, fill #0

## 2020-05-25 MED ORDER — METOPROLOL SUCCINATE ER 50 MG PO TB24
50.0000 mg | ORAL_TABLET | Freq: Every day | ORAL | 3 refills | Status: DC
  Filled 2020-05-25 – 2020-06-15 (×3): qty 30, 30d supply, fill #0
  Filled 2020-07-26: qty 30, 30d supply, fill #1
  Filled 2020-08-18 – 2020-09-22 (×4): qty 30, 30d supply, fill #2
  Filled 2020-11-06: qty 30, 30d supply, fill #3

## 2020-05-25 MED ORDER — FLUTICASONE-SALMETEROL 250-50 MCG/ACT IN AEPB
1.0000 | INHALATION_SPRAY | Freq: Two times a day (BID) | RESPIRATORY_TRACT | 3 refills | Status: DC
  Filled 2020-05-25 – 2020-06-15 (×3): qty 60, 30d supply, fill #0
  Filled 2020-07-26: qty 60, 30d supply, fill #1
  Filled 2020-08-18 – 2020-09-21 (×4): qty 60, 30d supply, fill #2
  Filled 2020-11-06: qty 60, 30d supply, fill #3

## 2020-05-25 MED ORDER — PHENTERMINE HCL 37.5 MG PO TABS: ORAL_TABLET | ORAL | 0 refills | Status: DC

## 2020-05-25 MED ORDER — LOSARTAN POTASSIUM-HCTZ 100-25 MG PO TABS
1.0000 | ORAL_TABLET | Freq: Every day | ORAL | 3 refills | Status: DC
  Filled 2020-05-25 – 2020-06-15 (×3): qty 30, 30d supply, fill #0
  Filled 2020-07-26: qty 30, 30d supply, fill #1
  Filled 2020-08-18 – 2020-09-21 (×4): qty 30, 30d supply, fill #2
  Filled 2020-11-06: qty 30, 30d supply, fill #3

## 2020-05-25 NOTE — Progress Notes (Signed)
Patient ID: Samuel [name], male   DOB: [birth year], 50 y.o.   MRN: [id number]   Samuel [name], is a 51 y.o. male  QAS:[id number]  [id number]  DOB - [birth year]  Subjective:  Chief Complaint and HPI: Samuel [name] is a 50 y.o. male here today for med RF and blood work.  He has not taken BP meds in 3 days but says BP is good when he is taking meds.    He has insurance now and wants to see urologist for decreased libido and ED bc viagra not really helping much.    Has an umbilical hernia he would like to see a surgeon about  ROS:   Constitutional:  No f/c, No night sweats, No unexplained weight loss. EENT:  No vision changes, No blurry vision, No hearing changes. No mouth, throat, or ear problems.  Respiratory: No cough, No SOB Cardiac: No CP, no palpitations GI:  No abd pain, No N/V/D. GU: No Urinary s/sx Musculoskeletal: No joint pain Neuro: No headache, no dizziness, no motor weakness.  Skin: No rash Endocrine:  No polydipsia. No polyuria.  Psych: Denies SI/HI  No problems updated.  ALLERGIES: Allergies  Allergen Reactions  . Shrimp [Shellfish Allergy]     Lip swell up.     PAST MEDICAL HISTORY: Past Medical History:  Diagnosis Date  . Asthma   . Hypertension   . Prediabetes   . Ulcer    venous insufficiency    MEDICATIONS AT HOME: Prior to Admission medications   Medication Sig Start Date End Date Taking? Authorizing Provider  cetirizine (ZYRTEC) 10 MG tablet TAKE 1 TABLET (10 MG TOTAL) BY MOUTH DAILY. 04/18/20 04/18/21 Yes Claiborne Rigg, NP  fluticasone-salmeterol (ADVAIR) 250-50 MCG/ACT AEPB Inhale 1 puff into the lungs in the morning and at bedtime. 05/25/20  Yes Yisel Megill M, PA-C  ipratropium-albuterol (DUONEB) 0.5-2.5 (3) MG/3ML SOLN TAKE 3 MLS BY NEBULIZATION EVERY 6 (SIX) HOURS AS NEEDED (SOB/DOE). 12/31/19 12/30/20 Yes Claiborne Rigg, NP  albuterol (VENTOLIN HFA) 108 (90 Base) MCG/ACT inhaler INHALE 1-2 PUFFS INTO THE LUNGS EVERY 6 (SIX)  HOURS AS NEEDED FOR WHEEZING OR SHORTNESS OF BREATH (COUGHING). 05/25/20   Anders Simmonds, PA-C  losartan-hydrochlorothiazide (HYZAAR) 100-25 MG tablet Take 1 tablet by mouth daily. 05/25/20 05/25/21  Anders Simmonds, PA-C  metoprolol succinate (TOPROL-XL) 50 MG 24 hr tablet Take 1 tablet (50 mg total) by mouth daily. 05/25/20 05/25/21  Anders Simmonds, PA-C  phentermine (ADIPEX-P) 37.5 MG tablet TAKE 1 TABLET(37.5 MG) BY MOUTH DAILY BEFORE BREAKFAST 05/25/20   Georgian Co M, PA-C  sildenafil (VIAGRA) 100 MG tablet TAKE 1 TABLET (100 MG TOTAL) BY MOUTH DAILY AS NEEDED FOR ERECTILE DYSFUNCTION. 05/25/20 05/25/21  Anders Simmonds, PA-C     Objective:  EXAM:   Vitals:   05/25/20 1613  BP: (!) 183/79  Pulse: 100  Resp: 20  SpO2: 95%  Weight: (!) 391 lb 12.8 oz (177.7 kg)  Height: 5\' 11"  (1.803 m)    General appearance : A&OX3. NAD. Non-toxic-appearing;  obese HEENT: Atraumatic and Normocephalic.  PERRLA. EOM intact.Neck: supple, no JVD. No cervical lymphadenopathy. No thyromegaly Chest/Lungs:  Breathing-non-labored, Good air entry bilaterally, breath sounds normal without rales, rhonchi, or wheezing  CVS: S1 S2 regular, no murmurs, gallops, rubs  Extremities: Bilateral Lower Ext shows no edema, both legs are warm to touch with = pulse throughout Neurology:  CN II-XII grossly intact, Non focal.   Psych:  TP linear. J/I WNL. Normal speech.  Appropriate eye contact and affect.  Skin:  No Rash  Data Review Lab Results  Component Value Date   HGBA1C 5.7 05/25/2020   HGBA1C 5.6 06/07/2019   HGBA1C 5.8 (H) 10/23/2018     Assessment & Plan   1. Essential hypertension Check BP once restarting meds.   - losartan-hydrochlorothiazide (HYZAAR) 100-25 MG tablet; Take 1 tablet by mouth daily.  Dispense: 30 tablet; Refill: 3 - metoprolol succinate (TOPROL-XL) 50 MG 24 hr tablet; Take 1 tablet (50 mg total) by mouth daily.  Dispense: 30 tablet; Refill: 3 - Comprehensive metabolic panel -  CBC with Differential/Platelet We have discussed target BP range and blood pressure goal. I have advised patient to check BP regularly and to call us back or report to clinic if the numbers are consistently higher than 140/90. We discussed the importance of compliance with medical therapy and DASH diet recommended, consequences of uncontrolled hypertension discussed.     2. Prediabetes -needs weight loss and diet changes.  RF phentermine X 1 as courtesy and should get subsequent from PCP.  He verbalizes needing to only take once BP is back to normal after restarting BP meds - HgB A1c - losartan-hydrochlorothiazide (HYZAAR) 100-25 MG tablet; Take 1 tablet by mouth daily.  Dispense: 30 tablet; Refill: 3 - metoprolol succinate (TOPROL-XL) 50 MG 24 hr tablet; Take 1 tablet (50 mg total) by mouth daily.  Dispense: 30 tablet; Refill: 3 I have had a lengthy discussion and provided education about insulin resistance and the intake of too much sugar/refined carbohydrates.  I have advised the patient to work at a goal of eliminating sugary drinks, candy, desserts, sweets, refined sugars, processed foods, and white carbohydrates.  The patient expresses understanding.   3. Dyslipidemia - Lipid panel  4. Tachycardia controlled - metoprolol succinate (TOPROL-XL) 50 MG 24 hr tablet; Take 1 tablet (50 mg total) by mouth daily.  Dispense: 30 tablet; Refill: 3  5. COPD mixed type (HCC) - albuterol (VENTOLIN HFA) 108 (90 Base) MCG/ACT inhaler; INHALE 1-2 PUFFS INTO THE LUNGS EVERY 6 (SIX) HOURS AS NEEDED FOR WHEEZING OR SHORTNESS OF BREATH (COUGHING).  Dispense: 18 g; Refill: 0 - fluticasone-salmeterol (ADVAIR) 250-50 MCG/ACT AEPB; Inhale 1 puff into the lungs in the morning and at bedtime.  Dispense: 60 each; Refill: 3  6. Morbid obesity with BMI of 50.0-59.9, adult (HCC) See #2 - phentermine (ADIPEX-P) 37.5 MG tablet; TAKE 1 TABLET(37.5 MG) BY MOUTH DAILY BEFORE BREAKFAST  Dispense: 30 tablet; Refill:  0  7. Erectile dysfunction, unspecified erectile dysfunction type - sildenafil (VIAGRA) 100 MG tablet; TAKE 1 TABLET (100 MG TOTAL) BY MOUTH DAILY AS NEEDED FOR ERECTILE DYSFUNCTION.  Dispense: 10 tablet; Refill: 0 - Ambulatory referral to Urology  8. Umbilical hernia without obstruction and without gangrene - Ambulatory referral to General Surgery   Patient have been counseled extensively about nutrition and exercise  Return in about 3 months (around 08/24/2020) for PCP;  chronic conditions.  The patient was given clear instructions to go to ER or return to medical center if symptoms don't improve, worsen or new problems develop. The patient verbalized understanding. The patient was told to call to get lab results if they haven't heard anything in the next week.     Georgian Co, PA-C [facility] and [facility] [place], [place] [phone]   05/25/2020, 4:38 PM

## 2020-05-26 ENCOUNTER — Other Ambulatory Visit: Payer: Self-pay

## 2020-05-26 LAB — COMPREHENSIVE METABOLIC PANEL
ALT: 18 IU/L (ref 0–44)
AST: 13 IU/L (ref 0–40)
Albumin/Globulin Ratio: 1.4 (ref 1.2–2.2)
Albumin: 4.2 g/dL (ref 4.0–5.0)
Alkaline Phosphatase: 78 IU/L (ref 44–121)
BUN/Creatinine Ratio: 7 — ABNORMAL LOW (ref 9–20)
BUN: 8 mg/dL (ref 6–24)
Bilirubin Total: 0.4 mg/dL (ref 0.0–1.2)
CO2: 24 mmol/L (ref 20–29)
Calcium: 9 mg/dL (ref 8.7–10.2)
Chloride: 101 mmol/L (ref 96–106)
Creatinine, Ser: 1.11 mg/dL (ref 0.76–1.27)
Globulin, Total: 2.9 g/dL (ref 1.5–4.5)
Glucose: 90 mg/dL (ref 65–99)
Potassium: 4.3 mmol/L (ref 3.5–5.2)
Sodium: 139 mmol/L (ref 134–144)
Total Protein: 7.1 g/dL (ref 6.0–8.5)
eGFR: 82 mL/min/{1.73_m2} (ref >59)

## 2020-05-26 LAB — LIPID PANEL
Chol/HDL Ratio: 3.9 ratio (ref 0.0–5.0)
Cholesterol, Total: 173 mg/dL (ref 100–199)
HDL: 44 mg/dL (ref >39)
LDL Chol Calc (NIH): 109 mg/dL — ABNORMAL HIGH (ref 0–99)
Triglycerides: 107 mg/dL (ref 0–149)
VLDL Cholesterol Cal: 20 mg/dL (ref 5–40)

## 2020-05-26 LAB — CBC WITH DIFFERENTIAL/PLATELET
Basophils Absolute: 0 10*3/uL (ref 0.0–0.2)
Basos: 0 %
EOS (ABSOLUTE): 0.1 10*3/uL (ref 0.0–0.4)
Eos: 1 %
Hematocrit: 41.6 % (ref 37.5–51.0)
Hemoglobin: 14 g/dL (ref 13.0–17.7)
Immature Grans (Abs): 0 10*3/uL (ref 0.0–0.1)
Immature Granulocytes: 1 %
Lymphocytes Absolute: 2.1 10*3/uL (ref 0.7–3.1)
Lymphs: 29 %
MCH: 27.1 pg (ref 26.6–33.0)
MCHC: 33.7 g/dL (ref 31.5–35.7)
MCV: 81 fL (ref 79–97)
Monocytes Absolute: 0.8 10*3/uL (ref 0.1–0.9)
Monocytes: 11 %
Neutrophils Absolute: 4.2 10*3/uL (ref 1.4–7.0)
Neutrophils: 58 %
Platelets: 338 10*3/uL (ref 150–450)
RBC: 5.17 x10E6/uL (ref 4.14–5.80)
RDW: 13.6 % (ref 11.6–15.4)
WBC: 7.3 10*3/uL (ref 3.4–10.8)

## 2020-06-02 ENCOUNTER — Other Ambulatory Visit: Payer: Self-pay

## 2020-06-02 MED FILL — Ipratropium-Albuterol Nebu Soln 0.5-2.5(3) MG/3ML: RESPIRATORY_TRACT | 30 days supply | Qty: 360 | Fill #0 | Status: CN

## 2020-06-09 ENCOUNTER — Other Ambulatory Visit: Payer: Self-pay

## 2020-06-15 ENCOUNTER — Other Ambulatory Visit: Payer: Self-pay

## 2020-06-15 MED FILL — Ipratropium-Albuterol Nebu Soln 0.5-2.5(3) MG/3ML: RESPIRATORY_TRACT | 30 days supply | Qty: 90 | Fill #0 | Status: AC

## 2020-06-16 ENCOUNTER — Other Ambulatory Visit: Payer: Self-pay

## 2020-07-26 ENCOUNTER — Other Ambulatory Visit: Payer: Self-pay | Admitting: Physician Assistant

## 2020-07-26 ENCOUNTER — Other Ambulatory Visit: Payer: Self-pay

## 2020-07-26 DIAGNOSIS — N529 Male erectile dysfunction, unspecified: Secondary | ICD-10-CM

## 2020-07-26 DIAGNOSIS — J449 Chronic obstructive pulmonary disease, unspecified: Secondary | ICD-10-CM

## 2020-07-26 MED ORDER — ALBUTEROL SULFATE HFA 108 (90 BASE) MCG/ACT IN AERS
INHALATION_SPRAY | RESPIRATORY_TRACT | 0 refills | Status: DC
  Filled 2020-07-26: qty 18, 16d supply, fill #0

## 2020-07-26 MED ORDER — SILDENAFIL CITRATE 100 MG PO TABS
ORAL_TABLET | ORAL | 0 refills | Status: DC
  Filled 2020-07-26: qty 10, 10d supply, fill #0

## 2020-07-26 MED FILL — Ipratropium-Albuterol Nebu Soln 0.5-2.5(3) MG/3ML: RESPIRATORY_TRACT | 30 days supply | Qty: 90 | Fill #1 | Status: AC

## 2020-07-27 ENCOUNTER — Other Ambulatory Visit: Payer: Self-pay

## 2020-07-28 ENCOUNTER — Other Ambulatory Visit: Payer: Self-pay

## 2020-08-18 ENCOUNTER — Other Ambulatory Visit: Payer: Self-pay | Admitting: Physician Assistant

## 2020-08-18 DIAGNOSIS — J449 Chronic obstructive pulmonary disease, unspecified: Secondary | ICD-10-CM

## 2020-08-18 DIAGNOSIS — N529 Male erectile dysfunction, unspecified: Secondary | ICD-10-CM

## 2020-08-18 MED FILL — Ipratropium-Albuterol Nebu Soln 0.5-2.5(3) MG/3ML: RESPIRATORY_TRACT | 30 days supply | Qty: 90 | Fill #2 | Status: CN

## 2020-08-18 MED FILL — Cetirizine HCl Tab 10 MG: ORAL | 30 days supply | Qty: 30 | Fill #0 | Status: CN

## 2020-08-18 NOTE — Telephone Encounter (Signed)
Requested medications are due for refill today inhaler one week early, Viagra only given 10 so hard to say if early  Requested medications are on the active medication list yes  Last refill 7/1  Last visit 4/28  Future visit scheduled no, had appt 7/29 canceled by provider  Notes to clinic Difficulty to assess, do not want to deny inhaler for asking early, Viagara only given 10 and can take one daily, please assess.

## 2020-08-21 ENCOUNTER — Other Ambulatory Visit: Payer: Self-pay

## 2020-08-21 MED ORDER — ALBUTEROL SULFATE HFA 108 (90 BASE) MCG/ACT IN AERS
INHALATION_SPRAY | RESPIRATORY_TRACT | 0 refills | Status: DC
  Filled 2020-08-21 – 2020-09-21 (×4): qty 18, 25d supply, fill #0

## 2020-08-21 MED ORDER — SILDENAFIL CITRATE 100 MG PO TABS
100.0000 mg | ORAL_TABLET | ORAL | 0 refills | Status: DC | PRN
  Filled 2020-08-21 – 2020-09-21 (×4): qty 10, 30d supply, fill #0

## 2020-08-25 ENCOUNTER — Telehealth: Payer: No Typology Code available for payment source | Admitting: Nurse Practitioner

## 2020-08-28 ENCOUNTER — Other Ambulatory Visit: Payer: Self-pay

## 2020-09-05 ENCOUNTER — Other Ambulatory Visit: Payer: Self-pay

## 2020-09-05 MED FILL — Ipratropium-Albuterol Nebu Soln 0.5-2.5(3) MG/3ML: RESPIRATORY_TRACT | 22 days supply | Qty: 90 | Fill #2 | Status: CN

## 2020-09-05 MED FILL — Cetirizine HCl Tab 10 MG: ORAL | 30 days supply | Qty: 30 | Fill #0 | Status: CN

## 2020-09-06 ENCOUNTER — Other Ambulatory Visit: Payer: Self-pay

## 2020-09-13 ENCOUNTER — Other Ambulatory Visit: Payer: Self-pay

## 2020-09-14 ENCOUNTER — Other Ambulatory Visit: Payer: Self-pay

## 2020-09-14 MED FILL — Ipratropium-Albuterol Nebu Soln 0.5-2.5(3) MG/3ML: RESPIRATORY_TRACT | 7 days supply | Qty: 90 | Fill #2 | Status: CN

## 2020-09-21 ENCOUNTER — Other Ambulatory Visit: Payer: Self-pay

## 2020-09-21 MED FILL — Ipratropium-Albuterol Nebu Soln 0.5-2.5(3) MG/3ML: RESPIRATORY_TRACT | 30 days supply | Qty: 90 | Fill #2 | Status: AC

## 2020-09-22 ENCOUNTER — Other Ambulatory Visit: Payer: Self-pay

## 2020-11-06 ENCOUNTER — Other Ambulatory Visit: Payer: Self-pay

## 2020-11-06 ENCOUNTER — Other Ambulatory Visit: Payer: Self-pay | Admitting: Family Medicine

## 2020-11-06 DIAGNOSIS — N529 Male erectile dysfunction, unspecified: Secondary | ICD-10-CM

## 2020-11-06 DIAGNOSIS — J449 Chronic obstructive pulmonary disease, unspecified: Secondary | ICD-10-CM

## 2020-11-06 MED FILL — Ipratropium-Albuterol Nebu Soln 0.5-2.5(3) MG/3ML: RESPIRATORY_TRACT | 30 days supply | Qty: 90 | Fill #3 | Status: AC

## 2020-11-07 NOTE — Telephone Encounter (Signed)
Requested medications are due for refill today yes  Requested medications are on the active medication list yes  Last refill 09/22/20  Last visit 05/25/20  Future visit scheduled NO  Notes to clinic Has already had a curtesy refill and there is no upcoming appointment scheduled.

## 2020-11-08 ENCOUNTER — Other Ambulatory Visit: Payer: Self-pay

## 2020-11-08 MED ORDER — ALBUTEROL SULFATE HFA 108 (90 BASE) MCG/ACT IN AERS
INHALATION_SPRAY | RESPIRATORY_TRACT | 0 refills | Status: DC
  Filled 2020-11-08: qty 18, 25d supply, fill #0

## 2020-11-10 ENCOUNTER — Other Ambulatory Visit: Payer: Self-pay

## 2020-11-13 ENCOUNTER — Encounter: Payer: Self-pay | Admitting: Nurse Practitioner

## 2020-11-14 ENCOUNTER — Telehealth: Payer: Self-pay | Admitting: Nurse Practitioner

## 2020-11-14 ENCOUNTER — Other Ambulatory Visit: Payer: Self-pay

## 2020-11-14 ENCOUNTER — Ambulatory Visit: Payer: Self-pay | Attending: Nurse Practitioner | Admitting: Nurse Practitioner

## 2020-11-14 NOTE — Telephone Encounter (Signed)
I have called both numbers listed. Can not leave VM on either number. [phone]

## 2020-11-14 NOTE — Telephone Encounter (Signed)
Mailbox full. Unable to LVM 

## 2020-12-20 ENCOUNTER — Other Ambulatory Visit: Payer: Self-pay | Admitting: Physician Assistant

## 2020-12-20 ENCOUNTER — Other Ambulatory Visit: Payer: Self-pay | Admitting: Family Medicine

## 2020-12-20 ENCOUNTER — Other Ambulatory Visit: Payer: Self-pay

## 2020-12-20 DIAGNOSIS — J449 Chronic obstructive pulmonary disease, unspecified: Secondary | ICD-10-CM

## 2020-12-20 DIAGNOSIS — I10 Essential (primary) hypertension: Secondary | ICD-10-CM

## 2020-12-20 DIAGNOSIS — R7303 Prediabetes: Secondary | ICD-10-CM

## 2020-12-20 MED ORDER — FLUTICASONE-SALMETEROL 250-50 MCG/ACT IN AEPB
1.0000 | INHALATION_SPRAY | Freq: Two times a day (BID) | RESPIRATORY_TRACT | 2 refills | Status: DC
  Filled 2020-12-20 – 2020-12-27 (×2): qty 60, 30d supply, fill #0

## 2020-12-20 MED ORDER — ALBUTEROL SULFATE HFA 108 (90 BASE) MCG/ACT IN AERS
INHALATION_SPRAY | RESPIRATORY_TRACT | 0 refills | Status: DC
  Filled 2020-12-20 – 2020-12-27 (×2): qty 18, 25d supply, fill #0

## 2020-12-20 MED ORDER — LOSARTAN POTASSIUM-HCTZ 100-25 MG PO TABS
1.0000 | ORAL_TABLET | Freq: Every day | ORAL | 0 refills | Status: DC
  Filled 2020-12-20 – 2020-12-27 (×2): qty 30, 30d supply, fill #0

## 2020-12-20 NOTE — Telephone Encounter (Signed)
Requested Prescriptions  Pending Prescriptions Disp Refills  . losartan-hydrochlorothiazide (HYZAAR) 100-25 MG tablet 30 tablet 3    Sig: Take 1 tablet by mouth daily.     Cardiovascular: ARB + Diuretic Combos Failed - 12/20/2020 11:42 AM      Failed - K in normal range and within 180 days    Potassium  Date Value Ref Range Status  05/25/2020 4.3 3.5 - 5.2 mmol/L Final         Failed - Na in normal range and within 180 days    Sodium  Date Value Ref Range Status  05/25/2020 139 134 - 144 mmol/L Final         Failed - Cr in normal range and within 180 days    Creat  Date Value Ref Range Status  05/20/2013 0.91 0.50 - 1.35 mg/dL Final   Creatinine, Ser  Date Value Ref Range Status  05/25/2020 1.11 0.76 - 1.27 mg/dL Final         Failed - Ca in normal range and within 180 days    Calcium  Date Value Ref Range Status  05/25/2020 9.0 8.7 - 10.2 mg/dL Final   Calcium, Ion  Date Value Ref Range Status  09/20/2010 1.22 1.12 - 1.32 mmol/L Final         Failed - Last BP in normal range    BP Readings from Last 1 Encounters:  05/25/20 (!) 183/79         Passed - Patient is not pregnant      Passed - Valid encounter within last 6 months    Recent Outpatient Visits          6 months ago Essential hypertension   [place] [facility] [facility] [place], [place], [place]   1 year ago Morbid obesity with BMI of 50.0-59.9, adult [facility])   [facility] [facility] [place], Shea Stakes, NP   1 year ago Encounter for annual physical exam   [facility] [place], Shea Stakes, NP   1 year ago Essential hypertension   [place] [facility] [place], Shea Stakes, NP   2 years ago Essential hypertension   [place] [facility] [place], Shea Stakes, NP      Future Appointments            In 2 weeks Sharon Seller, Marzella Schlein, PA-C [place] [facility]           . fluticasone-salmeterol  (ADVAIR DISKUS) 250-50 MCG/ACT AEPB 60 each 3    Sig: Inhale 1 puff into the lungs in the morning and at bedtime.     Pulmonology:  Combination Products Passed - 12/20/2020 11:42 AM      Passed - Valid encounter within last 12 months    Recent Outpatient Visits          6 months ago Essential hypertension   [place] [facility] [place], [place], [place]   1 year ago Morbid obesity with BMI of 50.0-59.9, adult [facility])   [place] [facility] [place], Shea Stakes, NP   1 year ago Encounter for annual physical exam   [facility] [place], Shea Stakes, NP   1 year ago Essential hypertension   [place] [facility] [place], Shea Stakes, NP   2 years ago Essential hypertension   [facility] [place], [place],  NP      Future Appointments            In 2 weeks Sharon Seller, [place] [facility]

## 2020-12-20 NOTE — Telephone Encounter (Signed)
Requested medication (s) are due for refill today: Yes  Requested medication (s) are on the active medication list: Yes  Last refill:  11/08/20  Future visit scheduled: Yes  Notes to clinic:  Unable to refill per protocol due to failed labs, no updated results.      Requested Prescriptions  Pending Prescriptions Disp Refills   losartan-hydrochlorothiazide (HYZAAR) 100-25 MG tablet 30 tablet 3    Sig: Take 1 tablet by mouth daily.     Cardiovascular: ARB + Diuretic Combos Failed - 12/20/2020 11:42 AM      Failed - K in normal range and within 180 days    Potassium  Date Value Ref Range Status  05/25/2020 4.3 3.5 - 5.2 mmol/L Final          Failed - Na in normal range and within 180 days    Sodium  Date Value Ref Range Status  05/25/2020 139 134 - 144 mmol/L Final          Failed - Cr in normal range and within 180 days    Creat  Date Value Ref Range Status  05/20/2013 0.91 0.50 - 1.35 mg/dL Final   Creatinine, Ser  Date Value Ref Range Status  05/25/2020 1.11 0.76 - 1.27 mg/dL Final          Failed - Ca in normal range and within 180 days    Calcium  Date Value Ref Range Status  05/25/2020 9.0 8.7 - 10.2 mg/dL Final   Calcium, Ion  Date Value Ref Range Status  09/20/2010 1.22 1.12 - 1.32 mmol/L Final          Failed - Last BP in normal range    BP Readings from Last 1 Encounters:  05/25/20 (!) 183/79          Passed - Patient is not pregnant      Passed - Valid encounter within last 6 months    Recent Outpatient Visits           6 months ago Essential hypertension   [place] [facility] [place], [place], [place]   1 year ago Morbid obesity with BMI of 50.0-59.9, adult [facility])   [facility] [place], Shea Stakes, NP   1 year ago Encounter for annual physical exam   [facility] [place], Shea Stakes, NP   1 year ago Essential hypertension   [place] [facility] [place], Shea Stakes, NP   2 years ago Essential hypertension   [place] [address] [facility] [place], Shea Stakes, NP       Future Appointments             In 2 weeks Anders Simmonds, PA-C [place] [facility]            Signed Prescriptions Disp Refills   fluticasone-salmeterol (ADVAIR DISKUS) 250-50 MCG/ACT AEPB 60 each 2    Sig: Inhale 1 puff into the lungs in the morning and at bedtime.     Pulmonology:  Combination Products Passed - 12/20/2020 11:42 AM      Passed - Valid encounter within last 12 months    Recent Outpatient Visits           6 months ago Essential hypertension   [facility] [place], [place], [place]   1 year ago Morbid obesity with BMI of 50.0-59.9, adult [facility])   [place]  [facility] [place], Shea Stakes, NP   1 year ago Encounter for annual physical exam   [facility] [place], Shea Stakes, NP   1 year ago Essential hypertension   [place] [address] [facility] [place], Shea Stakes, NP   2 years ago Essential hypertension   [place] [address] [facility] [place], Shea Stakes, NP       Future Appointments             In 2 weeks Sharon Seller, Marzella Schlein, PA-C [facility]

## 2020-12-20 NOTE — Telephone Encounter (Signed)
Requested Prescriptions  Pending Prescriptions Disp Refills  . albuterol (VENTOLIN HFA) 108 (90 Base) MCG/ACT inhaler 18 g 0    Sig: INHALE 1-2 PUFFS INTO THE LUNGS EVERY 6 (SIX) HOURS AS NEEDED FOR WHEEZING OR SHORTNESS OF BREATH (COUGHING).     Pulmonology:  Beta Agonists Failed - 12/20/2020 11:42 AM      Failed - One inhaler should last at least one month. If the patient is requesting refills earlier, contact the patient to check for uncontrolled symptoms.      Passed - Valid encounter within last 12 months    Recent Outpatient Visits          6 months ago Essential hypertension   [place] [facility] [facility] [place], [place], [place]   1 year ago Morbid obesity with BMI of 50.0-59.9, adult [facility])   [place] [facility] [place], Shea Stakes, NP   1 year ago Encounter for annual physical exam   [facility] [place], Shea Stakes, NP   1 year ago Essential hypertension   [place] [facility] [place], Shea Stakes, NP   2 years ago Essential hypertension   [place] [facility] [place], Shea Stakes, NP      Future Appointments            In 2 weeks Sharon Seller, Marzella Schlein, PA-C [place] [facility]

## 2020-12-25 ENCOUNTER — Other Ambulatory Visit: Payer: Self-pay

## 2020-12-25 ENCOUNTER — Other Ambulatory Visit: Payer: Self-pay | Admitting: Family Medicine

## 2020-12-25 DIAGNOSIS — N529 Male erectile dysfunction, unspecified: Secondary | ICD-10-CM

## 2020-12-27 ENCOUNTER — Other Ambulatory Visit: Payer: Self-pay

## 2020-12-27 NOTE — Telephone Encounter (Signed)
Requested medications are due for refill today.  yes  Requested medications are on the active medications list. yes  Last refill. 08/21/2020  Future visit scheduled.   yes  Notes to clinic.  Failed protocol d/t High BP. Also pt failed to rtc for 3 month re-check.

## 2020-12-28 ENCOUNTER — Other Ambulatory Visit: Payer: Self-pay

## 2021-01-01 ENCOUNTER — Other Ambulatory Visit: Payer: Self-pay

## 2021-01-03 ENCOUNTER — Ambulatory Visit: Payer: No Typology Code available for payment source | Admitting: Physician Assistant

## 2021-01-10 ENCOUNTER — Other Ambulatory Visit: Payer: Self-pay

## 2021-01-31 ENCOUNTER — Other Ambulatory Visit: Payer: Self-pay

## 2021-01-31 ENCOUNTER — Encounter: Payer: Self-pay | Admitting: Physician Assistant

## 2021-01-31 ENCOUNTER — Ambulatory Visit: Payer: Self-pay | Attending: Physician Assistant | Admitting: Physician Assistant

## 2021-01-31 VITALS — BP 129/88 | HR 104 | Resp 16 | Wt 392.0 lb

## 2021-01-31 DIAGNOSIS — R000 Tachycardia, unspecified: Secondary | ICD-10-CM

## 2021-01-31 DIAGNOSIS — Z6841 Body Mass Index (BMI) 40.0 and over, adult: Secondary | ICD-10-CM

## 2021-01-31 DIAGNOSIS — J449 Chronic obstructive pulmonary disease, unspecified: Secondary | ICD-10-CM

## 2021-01-31 DIAGNOSIS — N529 Male erectile dysfunction, unspecified: Secondary | ICD-10-CM

## 2021-01-31 DIAGNOSIS — I10 Essential (primary) hypertension: Secondary | ICD-10-CM

## 2021-01-31 DIAGNOSIS — Z23 Encounter for immunization: Secondary | ICD-10-CM

## 2021-01-31 DIAGNOSIS — R7303 Prediabetes: Secondary | ICD-10-CM

## 2021-01-31 MED ORDER — LOSARTAN POTASSIUM-HCTZ 100-25 MG PO TABS
1.0000 | ORAL_TABLET | Freq: Every day | ORAL | 1 refills | Status: DC
  Filled 2021-01-31: qty 90, 90d supply, fill #0
  Filled 2021-02-06: qty 30, 30d supply, fill #0
  Filled 2021-03-29: qty 30, 30d supply, fill #1
  Filled 2021-05-18: qty 30, 30d supply, fill #2
  Filled 2021-06-22: qty 90, 90d supply, fill #3

## 2021-01-31 MED ORDER — FLUTICASONE-SALMETEROL 250-50 MCG/ACT IN AEPB
1.0000 | INHALATION_SPRAY | Freq: Two times a day (BID) | RESPIRATORY_TRACT | 2 refills | Status: DC
  Filled 2021-01-31 – 2021-02-06 (×2): qty 60, 30d supply, fill #0
  Filled 2021-03-29: qty 60, 30d supply, fill #1

## 2021-01-31 MED ORDER — SILDENAFIL CITRATE 100 MG PO TABS
100.0000 mg | ORAL_TABLET | ORAL | 3 refills | Status: DC | PRN
  Filled 2021-01-31 – 2021-02-06 (×2): qty 10, 30d supply, fill #0
  Filled 2021-03-29: qty 10, 30d supply, fill #1
  Filled 2021-05-18: qty 10, 30d supply, fill #2
  Filled 2021-06-22: qty 10, 30d supply, fill #3

## 2021-01-31 MED ORDER — METOPROLOL SUCCINATE ER 50 MG PO TB24
50.0000 mg | ORAL_TABLET | Freq: Every day | ORAL | 1 refills | Status: DC
  Filled 2021-01-31: qty 90, 90d supply, fill #0
  Filled 2021-02-06: qty 30, 30d supply, fill #0
  Filled 2021-03-29 – 2021-03-30 (×2): qty 30, 30d supply, fill #1
  Filled 2021-05-18: qty 30, 30d supply, fill #2
  Filled 2021-06-22: qty 90, 90d supply, fill #3

## 2021-01-31 MED ORDER — IPRATROPIUM-ALBUTEROL 0.5-2.5 (3) MG/3ML IN SOLN
RESPIRATORY_TRACT | 2 refills | Status: DC
  Filled 2021-01-31: qty 360, fill #0
  Filled 2021-02-06: qty 360, 30d supply, fill #0
  Filled 2021-09-28: qty 360, 30d supply, fill #1
  Filled 2021-11-12: qty 360, 30d supply, fill #2

## 2021-01-31 MED ORDER — CETIRIZINE HCL 10 MG PO TABS
ORAL_TABLET | Freq: Every day | ORAL | 1 refills | Status: DC
  Filled 2021-01-31: qty 90, fill #0

## 2021-01-31 MED ORDER — ALBUTEROL SULFATE HFA 108 (90 BASE) MCG/ACT IN AERS
INHALATION_SPRAY | RESPIRATORY_TRACT | 2 refills | Status: DC
  Filled 2021-01-31: qty 18, fill #0
  Filled 2021-02-06: qty 18, 25d supply, fill #0
  Filled 2021-03-29: qty 18, 25d supply, fill #1
  Filled 2021-05-18: qty 18, 25d supply, fill #2

## 2021-01-31 NOTE — Progress Notes (Signed)
Patient ID: Samuel [name], male   DOB: 07/05/1970, 51 y.o.   MRN: [id number]   Samuel [name], is a 51 y.o. male  [id number]  [id number]  DOB - July 02, 1970  Chief Complaint  Patient presents with   Medication Management       Subjective:   Samuel [name] is a 51 y.o. male here today for med RF.  Previously prescribed phentermine about 1 year ago.  He is interested in getting back on this.  He denies any other issues or concerns  Patient has No headache, No chest pain, No abdominal pain - No Nausea, No new weakness tingling or numbness, No Cough - SOB.  No problems updated.  ALLERGIES: Allergies  Allergen Reactions   Shrimp [Shellfish Allergy]     Lip swell up.     PAST MEDICAL HISTORY: Past Medical History:  Diagnosis Date   Asthma    Hypertension    Prediabetes    Ulcer    venous insufficiency    MEDICATIONS AT HOME: Prior to Admission medications   Medication Sig Start Date End Date Taking? Authorizing Provider  phentermine (ADIPEX-P) 37.5 MG tablet TAKE 1 TABLET(37.5 MG) BY MOUTH DAILY BEFORE BREAKFAST 05/25/20  Yes Ivannia Willhelm M, PA-C  albuterol (VENTOLIN HFA) 108 (90 Base) MCG/ACT inhaler INHALE 1-2 PUFFS INTO THE LUNGS EVERY 6 (SIX) HOURS AS NEEDED FOR WHEEZING OR SHORTNESS OF BREATH (COUGHING). 01/31/21   Anders Simmonds, PA-C  cetirizine (ZYRTEC) 10 MG tablet TAKE 1 TABLET (10 MG TOTAL) BY MOUTH DAILY. 01/31/21 01/31/22  Anders Simmonds, PA-C  fluticasone-salmeterol (ADVAIR DISKUS) 250-50 MCG/ACT AEPB Inhale 1 puff into the lungs in the morning and at bedtime. 01/31/21   Anders Simmonds, PA-C  ipratropium-albuterol (DUONEB) 0.5-2.5 (3) MG/3ML SOLN TAKE 3 MLS BY NEBULIZATION EVERY 6 (SIX) HOURS AS NEEDED (SOB/DOE). 01/31/21 01/31/22  Anders Simmonds, PA-C  losartan-hydrochlorothiazide (HYZAAR) 100-25 MG tablet Take 1 tablet by mouth daily. 01/31/21 03/02/21  Anders Simmonds, PA-C  metoprolol succinate (TOPROL-XL) 50 MG 24 hr tablet Take 1 tablet (50 mg  total) by mouth daily. 01/31/21 01/31/22  Anders Simmonds, PA-C  sildenafil (VIAGRA) 100 MG tablet Take 1 tablet (100 mg total) by mouth as needed for erectile dysfunction. 01/31/21 01/31/22  Anders Simmonds, PA-C    ROS: Neg HEENT Neg resp Neg cardiac Neg GI Neg GU Neg MS Neg psych Neg neuro  Objective:   Vitals:   01/31/21 0851  BP: 129/88  Pulse: (!) 104  Resp: 16  SpO2: 91%  Weight: (!) 392 lb (177.8 kg)   Exam General appearance : Awake, alert, not in any distress. Speech Clear. Not toxic looking;  morbidly obese HEENT: Atraumatic and Normocephalic Neck: Supple, no JVD. No cervical lymphadenopathy.  Chest: Good air entry bilaterally, CTAB.  No rales/rhonchi/wheezing CVS: S1 S2 regular, no murmurs.  Extremities: B/L Lower Ext shows no edema, both legs are warm to touch Neurology: Awake alert, and oriented X 3, CN II-XII intact, Non focal Skin: No Rash  Data Review Lab Results  Component Value Date   HGBA1C 5.7 05/25/2020   HGBA1C 5.6 06/07/2019   HGBA1C 5.8 (H) 10/23/2018    Assessment & Plan   1. Need for immunization against influenza - Flu Vaccine QUAD 51mo+IM (Fluarix, Fluzone & Alfiuria Quad PF)  2. COPD mixed type (HCC) - albuterol (VENTOLIN HFA) 108 (90 Base) MCG/ACT inhaler; INHALE 1-2 PUFFS INTO THE LUNGS EVERY 6 (SIX) HOURS AS NEEDED FOR WHEEZING OR SHORTNESS OF BREATH (  COUGHING).  Dispense: 18 g; Refill: 2 - fluticasone-salmeterol (ADVAIR DISKUS) 250-50 MCG/ACT AEPB; Inhale 1 puff into the lungs in the morning and at bedtime.  Dispense: 60 each; Refill: 2 - ipratropium-albuterol (DUONEB) 0.5-2.5 (3) MG/3ML SOLN; TAKE 3 MLS BY NEBULIZATION EVERY 6 (SIX) HOURS AS NEEDED (SOB/DOE).  Dispense: 360 mL; Refill: 2  3. Essential hypertension - losartan-hydrochlorothiazide (HYZAAR) 100-25 MG tablet; Take 1 tablet by mouth daily.  Dispense: 90 tablet; Refill: 1 - metoprolol succinate (TOPROL-XL) 50 MG 24 hr tablet; Take 1 tablet (50 mg total) by mouth daily.   Dispense: 90 tablet; Refill: 1 - Comprehensive metabolic panel - Lipid panel - CBC with Differential/Platelet  4. Prediabetes I have had a lengthy discussion and provided education about insulin resistance and the intake of too much sugar/refined carbohydrates.  I have advised the patient to work at a goal of eliminating sugary drinks, candy, desserts, sweets, refined sugars, processed foods, and white carbohydrates.  The patient expresses understanding.   - losartan-hydrochlorothiazide (HYZAAR) 100-25 MG tablet; Take 1 tablet by mouth daily.  Dispense: 90 tablet; Refill: 1 - metoprolol succinate (TOPROL-XL) 50 MG 24 hr tablet; Take 1 tablet (50 mg total) by mouth daily.  Dispense: 90 tablet; Refill: 1 - Comprehensive metabolic panel - Hemoglobin A1c - Lipid panel - CBC with Differential/Platelet  5. Tachycardia - metoprolol succinate (TOPROL-XL) 50 MG 24 hr tablet; Take 1 tablet (50 mg total) by mouth daily.  Dispense: 90 tablet; Refill: 1  6. Erectile dysfunction, unspecified erectile dysfunction type - sildenafil (VIAGRA) 100 MG tablet; Take 1 tablet (100 mg total) by mouth as needed for erectile dysfunction.  Dispense: 10 tablet; Refill: 3  7. Morbid obesity with BMI of 50.0-59.9, adult [facility]) Proper nutrition and exercise discussed.  He will need to discuss phentermine with his PCP    Patient have been counseled extensively about nutrition and exercise. Other issues discussed during this visit include: low cholesterol diet, weight control and daily exercise, foot care, annual eye examinations at Ophthalmology, importance of adherence with medications and regular follow-up. We also discussed long term complications of uncontrolled diabetes and hypertension.   Return for make virtual appt as soon as available for weight loss med with Zelda.(virtual is fine)  The patient was given clear instructions to go to ER or return to medical center if symptoms don't improve, worsen or new  problems develop. The patient verbalized understanding. The patient was told to call to get lab results if they haven't heard anything in the next week.      Georgian Co, PA-C [facility] and [facility] [place], [place] [phone]   01/31/2021, 9:00 AM

## 2021-02-01 ENCOUNTER — Other Ambulatory Visit: Payer: Self-pay | Admitting: Physician Assistant

## 2021-02-01 LAB — CBC WITH DIFFERENTIAL/PLATELET
Basophils Absolute: 0 10*3/uL (ref 0.0–0.2)
Basos: 0 %
EOS (ABSOLUTE): 0.1 10*3/uL (ref 0.0–0.4)
Eos: 1 %
Hematocrit: 43.6 % (ref 37.5–51.0)
Hemoglobin: 13.9 g/dL (ref 13.0–17.7)
Immature Grans (Abs): 0.1 10*3/uL (ref 0.0–0.1)
Immature Granulocytes: 1 %
Lymphocytes Absolute: 2.3 10*3/uL (ref 0.7–3.1)
Lymphs: 30 %
MCH: 25.8 pg — ABNORMAL LOW (ref 26.6–33.0)
MCHC: 31.9 g/dL (ref 31.5–35.7)
MCV: 81 fL (ref 79–97)
Monocytes Absolute: 0.8 10*3/uL (ref 0.1–0.9)
Monocytes: 10 %
Neutrophils Absolute: 4.5 10*3/uL (ref 1.4–7.0)
Neutrophils: 58 %
Platelets: 336 10*3/uL (ref 150–450)
RBC: 5.39 x10E6/uL (ref 4.14–5.80)
RDW: 13.7 % (ref 11.6–15.4)
WBC: 7.9 10*3/uL (ref 3.4–10.8)

## 2021-02-01 LAB — LIPID PANEL
Chol/HDL Ratio: 4.1 ratio (ref 0.0–5.0)
Cholesterol, Total: 171 mg/dL (ref 100–199)
HDL: 42 mg/dL (ref >39)
LDL Chol Calc (NIH): 113 mg/dL — ABNORMAL HIGH (ref 0–99)
Triglycerides: 86 mg/dL (ref 0–149)
VLDL Cholesterol Cal: 16 mg/dL (ref 5–40)

## 2021-02-01 LAB — HEMOGLOBIN A1C
Est. average glucose Bld gHb Est-mCnc: 123 mg/dL
Hgb A1c MFr Bld: 5.9 % — ABNORMAL HIGH (ref 4.8–5.6)

## 2021-02-01 LAB — COMPREHENSIVE METABOLIC PANEL
ALT: 15 IU/L (ref 0–44)
AST: 15 IU/L (ref 0–40)
Albumin/Globulin Ratio: 1.6 (ref 1.2–2.2)
Albumin: 4.4 g/dL (ref 4.0–5.0)
Alkaline Phosphatase: 78 IU/L (ref 44–121)
BUN/Creatinine Ratio: 11 (ref 9–20)
BUN: 10 mg/dL (ref 6–24)
Bilirubin Total: 0.3 mg/dL (ref 0.0–1.2)
CO2: 25 mmol/L (ref 20–29)
Calcium: 9.2 mg/dL (ref 8.7–10.2)
Chloride: 99 mmol/L (ref 96–106)
Creatinine, Ser: 0.92 mg/dL (ref 0.76–1.27)
Globulin, Total: 2.7 g/dL (ref 1.5–4.5)
Glucose: 97 mg/dL (ref 70–99)
Potassium: 4.1 mmol/L (ref 3.5–5.2)
Sodium: 137 mmol/L (ref 134–144)
Total Protein: 7.1 g/dL (ref 6.0–8.5)
eGFR: 103 mL/min/{1.73_m2} (ref >59)

## 2021-02-01 MED ORDER — METFORMIN HCL 500 MG PO TABS
500.0000 mg | ORAL_TABLET | Freq: Two times a day (BID) | ORAL | 3 refills | Status: DC
  Filled 2021-02-01: qty 60, 30d supply, fill #0
  Filled 2021-03-29: qty 60, 30d supply, fill #1
  Filled 2021-05-18: qty 180, 90d supply, fill #2
  Filled 2021-09-28: qty 180, 90d supply, fill #3

## 2021-02-02 ENCOUNTER — Other Ambulatory Visit: Payer: Self-pay

## 2021-02-03 ENCOUNTER — Other Ambulatory Visit: Payer: Self-pay

## 2021-02-06 ENCOUNTER — Ambulatory Visit: Payer: Self-pay | Attending: Nurse Practitioner | Admitting: Nurse Practitioner

## 2021-02-06 ENCOUNTER — Encounter: Payer: Self-pay | Admitting: Nurse Practitioner

## 2021-02-06 ENCOUNTER — Other Ambulatory Visit: Payer: Self-pay

## 2021-02-06 DIAGNOSIS — R7303 Prediabetes: Secondary | ICD-10-CM

## 2021-02-06 DIAGNOSIS — Z6841 Body Mass Index (BMI) 40.0 and over, adult: Secondary | ICD-10-CM

## 2021-02-06 MED ORDER — OZEMPIC (0.25 OR 0.5 MG/DOSE) 2 MG/1.5ML ~~LOC~~ SOPN
0.5000 mg | PEN_INJECTOR | SUBCUTANEOUS | 1 refills | Status: AC
  Filled 2021-02-06: qty 1.5, 28d supply, fill #0
  Filled 2021-03-29: qty 1.5, 28d supply, fill #1

## 2021-02-06 NOTE — Progress Notes (Signed)
Virtual Visit via Telephone Note Due to national recommendations of social distancing due to COVID 19, telehealth visit is felt to be most appropriate for this patient at this time.  I discussed the limitations, risks, security and privacy concerns of performing an evaluation and management service by telephone and the availability of in person appointments. I also discussed with the patient that there may be a patient responsible charge related to this service. The patient expressed understanding and agreed to proceed.    I connected with Samuel [name] on 02/06/21  at   8:30 AM EST  EDT by telephone and verified that I am speaking with the correct person using two identifiers.  Location of Patient: Private Residence   [facility] Provider[facility]    Persons participating in Telemedicine visit: [name] Samuel [name]    History of Present Illness: Telemedicine visit for: Prediabetes and morbid obesity with BMI greater than 50  Current weight is 385.  We will add Ozempic today and he will continue on metformin 500 mg twice daily. BP Readings from Last 3 Encounters:  01/31/21 129/88  05/25/20 (!) 183/79  07/20/19 (!) 155/95    Wt Readings from Last 3 Encounters:  01/31/21 (!) 392 lb (177.8 kg)  05/25/20 (!) 391 lb 12.8 oz (177.7 kg)  07/20/19 (!) 383 lb (173.7 kg)    Lab Results  Component Value Date   HGBA1C 5.9 (H) 01/31/2021     HTN He has not been monitoring his blood pressure at home but endorses adherence taking Hyzaar 100-25 mg daily and Toprol XL 50 mg daily.  Blood pressures well controlled. BP Readings from Last 3 Encounters:  01/31/21 129/88  05/25/20 (!) 183/79  07/20/19 (!) 155/95    Past Medical History:  Diagnosis Date   Asthma    Hypertension    Prediabetes    Ulcer    venous insufficiency    History reviewed. No pertinent surgical history.  Family History  Problem Relation Age of Onset    Asthma Mother    Hypertension Mother     Social History   Socioeconomic History   Marital status: Single    Spouse name: Not on file   Number of children: 4   Years of education: Not on file   Highest education level: Not on file  Occupational History   Occupation: Handyman    Employer: [employer]  Tobacco Use   Smoking status: Former    Packs/day: 1.00    Years: 15.00    Pack years: 15.00    Types: Cigarettes    Quit date: 10/28/2012    Years since quitting: 8.2   Smokeless tobacco: Never  Vaping Use   Vaping Use: Never used  Substance and Sexual Activity   Alcohol use: Yes    Alcohol/week: 4.0 standard drinks    Types: 2 Cans of beer, 2 Shots of liquor per week   Drug use: No   Sexual activity: Yes  Other Topics Concern   Not on file  Social History Narrative   Not on file   Social Determinants of Health   Financial Resource Strain: Not on file  Food Insecurity: Not on file  Transportation Needs: Not on file  Physical Activity: Not on file  Stress: Not on file  Social Connections: Not on file     Observations/Objective: Awake, alert and oriented x 3   Review of Systems  Constitutional:  Negative for fever, malaise/fatigue and weight  loss.  HENT: Negative.  Negative for nosebleeds.   Eyes: Negative.  Negative for blurred vision, double vision and photophobia.  Respiratory: Negative.  Negative for cough and shortness of breath.   Cardiovascular: Negative.  Negative for chest pain, palpitations and leg swelling.  Gastrointestinal: Negative.  Negative for heartburn, nausea and vomiting.  Musculoskeletal: Negative.  Negative for myalgias.  Neurological: Negative.  Negative for dizziness, focal weakness, seizures and headaches.  Psychiatric/Behavioral: Negative.  Negative for suicidal ideas.    Assessment and Plan: Diagnoses and all orders for this visit:  Prediabetes -     Semaglutide,0.25 or 0.5MG /DOS, (OZEMPIC, 0.25 OR 0.5 MG/DOSE,) 2 MG/1.5ML SOPN;  Inject 0.5 mg into the skin once a week. NEEDS PASS  Morbid obesity with BMI of 50.0-59.9, adult (HCC) -     Semaglutide,0.25 or 0.5MG /DOS, (OZEMPIC, 0.25 OR 0.5 MG/DOSE,) 2 MG/1.5ML SOPN; Inject 0.5 mg into the skin once a week. NEEDS PASS Discussed diet and exercise for person with BMI >50. Instructed: You must burn more calories than you eat. Losing 5 percent of your body weight should be considered a success. In the longer term, losing more than 15 percent of your body weight and staying at this weight is an extremely good result. However, keep in mind that even losing 5 percent of your body weight leads to important health benefits, so try not to get discouraged if you're not able to lose more than this. Will recheck weight in 4-6 weeks    Follow Up Instructions Return in about 6 weeks (around 03/20/2021).     I discussed the assessment and treatment plan with the patient. The patient was provided an opportunity to ask questions and all were answered. The patient agreed with the plan and demonstrated an understanding of the instructions.   The patient was advised to call back or seek an in-person evaluation if the symptoms worsen or if the condition fails to improve as anticipated.  I provided 15 minutes of non-face-to-face time during this encounter including median intraservice time, reviewing previous notes, labs, imaging, medications and explaining diagnosis and management.  Claiborne Rigg, FNP-BC

## 2021-02-07 ENCOUNTER — Other Ambulatory Visit: Payer: Self-pay

## 2021-02-12 ENCOUNTER — Other Ambulatory Visit: Payer: Self-pay

## 2021-03-26 ENCOUNTER — Ambulatory Visit: Payer: No Typology Code available for payment source | Admitting: Nurse Practitioner

## 2021-03-29 ENCOUNTER — Other Ambulatory Visit: Payer: Self-pay

## 2021-03-30 ENCOUNTER — Other Ambulatory Visit: Payer: Self-pay

## 2021-04-05 ENCOUNTER — Other Ambulatory Visit: Payer: Self-pay

## 2021-04-06 ENCOUNTER — Other Ambulatory Visit: Payer: Self-pay

## 2021-05-18 ENCOUNTER — Other Ambulatory Visit: Payer: Self-pay | Admitting: Pharmacist

## 2021-05-18 ENCOUNTER — Other Ambulatory Visit: Payer: Self-pay

## 2021-05-18 MED ORDER — FLUTICASONE FUROATE-VILANTEROL 100-25 MCG/ACT IN AEPB
1.0000 | INHALATION_SPRAY | Freq: Every day | RESPIRATORY_TRACT | 2 refills | Status: DC
  Filled 2021-05-18: qty 60, 30d supply, fill #0

## 2021-05-18 MED ORDER — TRULICITY 1.5 MG/0.5ML ~~LOC~~ SOAJ
1.5000 mg | SUBCUTANEOUS | 2 refills | Status: DC
  Filled 2021-05-18: qty 2, 28d supply, fill #0
  Filled 2021-06-22: qty 2, 28d supply, fill #1
  Filled 2021-08-27: qty 2, 28d supply, fill #2

## 2021-05-18 NOTE — Progress Notes (Signed)
Per auto-sub, changing Advair to Breo and Ozempic to Trulicity.  ?

## 2021-06-22 ENCOUNTER — Other Ambulatory Visit: Payer: Self-pay | Admitting: Family Medicine

## 2021-06-22 ENCOUNTER — Other Ambulatory Visit: Payer: Self-pay

## 2021-06-22 ENCOUNTER — Other Ambulatory Visit: Payer: Self-pay | Admitting: Physician Assistant

## 2021-06-22 DIAGNOSIS — J449 Chronic obstructive pulmonary disease, unspecified: Secondary | ICD-10-CM

## 2021-06-22 MED ORDER — FLUTICASONE FUROATE-VILANTEROL 100-25 MCG/ACT IN AEPB
1.0000 | INHALATION_SPRAY | Freq: Every day | RESPIRATORY_TRACT | 0 refills | Status: DC
  Filled 2021-06-22: qty 60, 30d supply, fill #0
  Filled 2021-08-27 – 2021-12-11 (×3): qty 60, 60d supply, fill #0

## 2021-06-22 MED ORDER — ALBUTEROL SULFATE HFA 108 (90 BASE) MCG/ACT IN AERS
INHALATION_SPRAY | RESPIRATORY_TRACT | 0 refills | Status: DC
  Filled 2021-06-22: qty 18, 25d supply, fill #0

## 2021-06-26 ENCOUNTER — Other Ambulatory Visit: Payer: Self-pay

## 2021-07-09 ENCOUNTER — Other Ambulatory Visit: Payer: Self-pay

## 2021-08-27 ENCOUNTER — Other Ambulatory Visit: Payer: Self-pay | Admitting: Family Medicine

## 2021-08-27 ENCOUNTER — Other Ambulatory Visit: Payer: Self-pay | Admitting: Physician Assistant

## 2021-08-27 ENCOUNTER — Other Ambulatory Visit: Payer: Self-pay

## 2021-08-27 DIAGNOSIS — N529 Male erectile dysfunction, unspecified: Secondary | ICD-10-CM

## 2021-08-27 DIAGNOSIS — J449 Chronic obstructive pulmonary disease, unspecified: Secondary | ICD-10-CM

## 2021-08-28 ENCOUNTER — Other Ambulatory Visit: Payer: Self-pay

## 2021-08-29 ENCOUNTER — Other Ambulatory Visit: Payer: Self-pay

## 2021-08-29 MED ORDER — SILDENAFIL CITRATE 100 MG PO TABS
100.0000 mg | ORAL_TABLET | ORAL | 3 refills | Status: DC | PRN
  Filled 2021-08-29: qty 10, 30d supply, fill #0
  Filled 2021-09-28: qty 10, 30d supply, fill #1
  Filled 2021-11-12: qty 10, 30d supply, fill #2
  Filled 2021-12-09: qty 10, 30d supply, fill #3

## 2021-08-29 NOTE — Telephone Encounter (Signed)
Requested Prescriptions  Pending Prescriptions Disp Refills  . sildenafil (VIAGRA) 100 MG tablet 10 tablet 3    Sig: Take 1 tablet (100 mg total) by mouth as needed for erectile dysfunction.     Urology: Erectile Dysfunction Agents Passed - 08/27/2021  4:42 PM      Passed - AST in normal range and within 360 days    AST  Date Value Ref Range Status  01/31/2021 15 0 - 40 IU/L Final         Passed - ALT in normal range and within 360 days    ALT  Date Value Ref Range Status  01/31/2021 15 0 - 44 IU/L Final         Passed - Last BP in normal range    BP Readings from Last 1 Encounters:  01/31/21 129/88         Passed - Valid encounter within last 12 months    Recent Outpatient Visits          6 months ago Prediabetes   [facility] [place], Shea Stakes, NP   7 months ago Need for immunization against influenza   [facility] [place], Marzella [place], [place]   1 year ago Essential hypertension   [place] [address] [facility] [place], [place], [place]   2 years ago Morbid obesity with BMI of 50.0-59.9, adult [facility])   [facility] [place], Shea Stakes, NP   2 years ago Encounter for annual physical exam   [facility] [place], Shea Stakes, NP

## 2021-08-29 NOTE — Telephone Encounter (Signed)
Requested medication (s) are due for refill today - yes  Requested medication (s) are on the active medication list -yes  Future visit scheduled -no  Last refill: 06/22/21 18g  Notes to clinic: Passes RF protocol- but last fill has notes-sent for review of request  Requested Prescriptions  Pending Prescriptions Disp Refills   albuterol (VENTOLIN HFA) 108 (90 Base) MCG/ACT inhaler 18 g 0    Sig: INHALE 1-2 PUFFS INTO THE LUNGS EVERY 6 (SIX) HOURS AS NEEDED FOR WHEEZING OR SHORTNESS OF BREATH (COUGHING).     Pulmonology:  Beta Agonists 2 Passed - 08/27/2021  4:42 PM      Passed - Last BP in normal range    BP Readings from Last 1 Encounters:  01/31/21 129/88         Passed - Last Heart Rate in normal range    Pulse Readings from Last 1 Encounters:  01/31/21 (!) 104         Passed - Valid encounter within last 12 months    Recent Outpatient Visits           6 months ago Prediabetes   [place] [facility] [place], Shea Stakes, NP   7 months ago Need for immunization against influenza   [facility] [place], [place]zella [place], [place]   1 year ago Essential hypertension   [place] [address] [facility] [place], [place], [place]   2 years ago Morbid obesity with BMI of 50.0-59.9, adult [facility])   [place] [facility] [place], Shea Stakes, NP   2 years ago Encounter for annual physical exam   [facility] [place], Shea Stakes, NP                 Requested Prescriptions  Pending Prescriptions Disp Refills   albuterol (VENTOLIN HFA) 108 (90 Base) MCG/ACT inhaler 18 g 0    Sig: INHALE 1-2 PUFFS INTO THE LUNGS EVERY 6 (SIX) HOURS AS NEEDED FOR WHEEZING OR SHORTNESS OF BREATH (COUGHING).     Pulmonology:  Beta Agonists 2 Passed - 08/27/2021  4:42 PM      Passed - Last BP in normal range    BP Readings from Last 1 Encounters:  01/31/21 129/88         Passed - Last Heart Rate in normal  range    Pulse Readings from Last 1 Encounters:  01/31/21 (!) 104         Passed - Valid encounter within last 12 months    Recent Outpatient Visits           6 months ago Prediabetes   [place] [facility] [place], Shea Stakes, NP   7 months ago Need for immunization against influenza   [facility] [place], [place]zella [place], [place]   1 year ago Essential hypertension   [place] [address] [facility] [place], [place], [place]   2 years ago Morbid obesity with BMI of 50.0-59.9, adult [facility])   [place] [place], Shea Stakes, NP   2 years ago Encounter for annual physical exam   [facility] [place], Shea Stakes, NP

## 2021-08-31 ENCOUNTER — Other Ambulatory Visit: Payer: Self-pay

## 2021-09-28 ENCOUNTER — Other Ambulatory Visit: Payer: Self-pay

## 2021-09-28 ENCOUNTER — Other Ambulatory Visit: Payer: Self-pay | Admitting: Physician Assistant

## 2021-09-28 ENCOUNTER — Other Ambulatory Visit: Payer: Self-pay | Admitting: Family Medicine

## 2021-09-28 DIAGNOSIS — I10 Essential (primary) hypertension: Secondary | ICD-10-CM

## 2021-09-28 DIAGNOSIS — J449 Chronic obstructive pulmonary disease, unspecified: Secondary | ICD-10-CM

## 2021-09-28 DIAGNOSIS — R000 Tachycardia, unspecified: Secondary | ICD-10-CM

## 2021-09-28 DIAGNOSIS — R7303 Prediabetes: Secondary | ICD-10-CM

## 2021-09-28 MED ORDER — CETIRIZINE HCL 10 MG PO TABS
ORAL_TABLET | Freq: Every day | ORAL | 0 refills | Status: DC
  Filled 2021-09-28: qty 30, 30d supply, fill #0

## 2021-09-28 MED ORDER — ALBUTEROL SULFATE HFA 108 (90 BASE) MCG/ACT IN AERS
INHALATION_SPRAY | RESPIRATORY_TRACT | 0 refills | Status: DC
  Filled 2021-09-28: qty 6.7, 25d supply, fill #0

## 2021-09-28 MED ORDER — TRULICITY 1.5 MG/0.5ML ~~LOC~~ SOAJ
1.5000 mg | SUBCUTANEOUS | 0 refills | Status: DC
  Filled 2021-09-28: qty 2, 28d supply, fill #0

## 2021-09-28 MED ORDER — METOPROLOL SUCCINATE ER 50 MG PO TB24
50.0000 mg | ORAL_TABLET | Freq: Every day | ORAL | 0 refills | Status: DC
  Filled 2021-09-28: qty 30, 30d supply, fill #0

## 2021-09-28 MED ORDER — LOSARTAN POTASSIUM-HCTZ 100-25 MG PO TABS
1.0000 | ORAL_TABLET | Freq: Every day | ORAL | 0 refills | Status: DC
  Filled 2021-09-28: qty 30, 30d supply, fill #0

## 2021-10-04 ENCOUNTER — Other Ambulatory Visit: Payer: Self-pay

## 2021-10-18 NOTE — Telephone Encounter (Signed)
Erroneous

## 2021-10-30 ENCOUNTER — Ambulatory Visit: Payer: No Typology Code available for payment source | Admitting: Nurse Practitioner

## 2021-11-12 ENCOUNTER — Other Ambulatory Visit: Payer: Self-pay | Admitting: Family Medicine

## 2021-11-12 ENCOUNTER — Other Ambulatory Visit: Payer: Self-pay

## 2021-11-12 DIAGNOSIS — R000 Tachycardia, unspecified: Secondary | ICD-10-CM

## 2021-11-12 DIAGNOSIS — R7303 Prediabetes: Secondary | ICD-10-CM

## 2021-11-12 DIAGNOSIS — I10 Essential (primary) hypertension: Secondary | ICD-10-CM

## 2021-11-12 DIAGNOSIS — J449 Chronic obstructive pulmonary disease, unspecified: Secondary | ICD-10-CM

## 2021-11-12 MED ORDER — LOSARTAN POTASSIUM-HCTZ 100-25 MG PO TABS
1.0000 | ORAL_TABLET | Freq: Every day | ORAL | 0 refills | Status: DC
  Filled 2021-11-12: qty 30, 30d supply, fill #0

## 2021-11-12 MED ORDER — ALBUTEROL SULFATE HFA 108 (90 BASE) MCG/ACT IN AERS
INHALATION_SPRAY | RESPIRATORY_TRACT | 0 refills | Status: DC
  Filled 2021-11-12: qty 6.7, 25d supply, fill #0

## 2021-11-12 MED ORDER — METOPROLOL SUCCINATE ER 50 MG PO TB24
50.0000 mg | ORAL_TABLET | Freq: Every day | ORAL | 0 refills | Status: DC
  Filled 2021-11-12: qty 30, 30d supply, fill #0

## 2021-11-13 ENCOUNTER — Other Ambulatory Visit: Payer: Self-pay

## 2021-11-14 ENCOUNTER — Other Ambulatory Visit: Payer: Self-pay

## 2021-11-14 ENCOUNTER — Other Ambulatory Visit: Payer: Self-pay | Admitting: Family Medicine

## 2021-11-14 MED ORDER — TRULICITY 1.5 MG/0.5ML ~~LOC~~ SOAJ
1.5000 mg | SUBCUTANEOUS | 0 refills | Status: DC
  Filled 2021-11-14 – 2021-12-09 (×2): qty 2, 28d supply, fill #0

## 2021-11-14 NOTE — Telephone Encounter (Signed)
Requested medication (s) are due for refill today: yes  Requested medication (s) are on the active medication list: yes  Last refill:  09/28/21 #27mL/0  Future visit scheduled: yes  Notes to clinic:  Unable to refill per protocol due to failed labs, no updated results.    Requested Prescriptions  Pending Prescriptions Disp Refills   Dulaglutide (TRULICITY) 1.5 HD/6.2IW SOPN 2 mL 0    Sig: Inject 1.5 mg into the skin once a week.     Endocrinology:  Diabetes - GLP-1 Receptor Agonists Failed - 11/14/2021  4:56 PM      Failed - HBA1C is between 0 and 7.9 and within 180 days    HbA1c, POC (controlled diabetic range)  Date Value Ref Range Status  05/25/2020 5.7 0.0 - 7.0 % Final   Hgb A1c MFr Bld  Date Value Ref Range Status  01/31/2021 5.9 (H) 4.8 - 5.6 % Final    Comment:             Prediabetes: 5.7 - 6.4          Diabetes: >6.4          Glycemic control for adults with diabetes: <7.0          Failed - Valid encounter within last 6 months    Recent Outpatient Visits           9 months ago Prediabetes   [place], Vernia Buff, NP   9 months ago Need for immunization against influenza   [place], Dionne Bucy, [place]   1 year ago Essential hypertension   [place], [place], [place]   2 years ago Morbid obesity with BMI of 50.0-59.9, adult [facility])   [place], Vernia Buff, NP   2 years ago Encounter for annual physical exam   [place], Vernia Buff, NP       Future Appointments             In 1 month Gildardo Pounds, NP [place]

## 2021-11-21 ENCOUNTER — Other Ambulatory Visit: Payer: Self-pay

## 2021-12-09 ENCOUNTER — Other Ambulatory Visit: Payer: Self-pay | Admitting: Family Medicine

## 2021-12-09 ENCOUNTER — Other Ambulatory Visit: Payer: Self-pay | Admitting: Physician Assistant

## 2021-12-09 DIAGNOSIS — I10 Essential (primary) hypertension: Secondary | ICD-10-CM

## 2021-12-09 DIAGNOSIS — R000 Tachycardia, unspecified: Secondary | ICD-10-CM

## 2021-12-09 DIAGNOSIS — J449 Chronic obstructive pulmonary disease, unspecified: Secondary | ICD-10-CM

## 2021-12-09 DIAGNOSIS — R7303 Prediabetes: Secondary | ICD-10-CM

## 2021-12-10 ENCOUNTER — Other Ambulatory Visit: Payer: Self-pay

## 2021-12-10 MED ORDER — LOSARTAN POTASSIUM-HCTZ 100-25 MG PO TABS
1.0000 | ORAL_TABLET | Freq: Every day | ORAL | 0 refills | Status: DC
  Filled 2021-12-10: qty 30, 30d supply, fill #0

## 2021-12-10 MED ORDER — ALBUTEROL SULFATE HFA 108 (90 BASE) MCG/ACT IN AERS
INHALATION_SPRAY | RESPIRATORY_TRACT | 0 refills | Status: DC
  Filled 2021-12-10: qty 6.7, 25d supply, fill #0

## 2021-12-10 MED ORDER — METOPROLOL SUCCINATE ER 50 MG PO TB24
50.0000 mg | ORAL_TABLET | Freq: Every day | ORAL | 0 refills | Status: DC
  Filled 2021-12-10: qty 30, 30d supply, fill #0

## 2021-12-10 MED ORDER — IPRATROPIUM-ALBUTEROL 0.5-2.5 (3) MG/3ML IN SOLN
RESPIRATORY_TRACT | 0 refills | Status: DC
  Filled 2021-12-10: qty 360, 30d supply, fill #0

## 2021-12-10 MED ORDER — CETIRIZINE HCL 10 MG PO TABS
ORAL_TABLET | Freq: Every day | ORAL | 0 refills | Status: DC
  Filled 2021-12-10: qty 30, 30d supply, fill #0

## 2021-12-10 NOTE — Telephone Encounter (Signed)
Requested Prescriptions  Pending Prescriptions Disp Refills   ipratropium-albuterol (DUONEB) 0.5-2.5 (3) MG/3ML SOLN 360 mL     Sig: TAKE 3 MLS BY NEBULIZATION EVERY 6 (SIX) HOURS AS NEEDED FOR SHORTNESS OF BREATH     Pulmonology:  Combination Products - albuterol / ipratropium Passed - 12/09/2021  6:42 PM      Passed - Last BP in normal range    BP Readings from Last 1 Encounters:  01/31/21 129/88         Passed - Last Heart Rate in normal range    Pulse Readings from Last 1 Encounters:  01/31/21 (!) 104         Passed - Valid encounter within last 12 months    Recent Outpatient Visits           10 months ago Prediabetes   [place] [facility] [place], Shea Stakes, NP   10 months ago Need for immunization against influenza   [facility] [place], Marzella [place], [place]   1 year ago Essential hypertension   [place] [address] [facility] [place], [place], [place]   2 years ago Morbid obesity with BMI of 50.0-59.9, adult [facility])   [place] [place], Shea Stakes, NP   2 years ago Encounter for annual physical exam   [employer] [facility] [place], Shea Stakes, NP       Future Appointments             In 1 month Claiborne Rigg, NP [facility]

## 2021-12-10 NOTE — Telephone Encounter (Signed)
Requested medication (s) are due for refill today: yes  Requested medication (s) are on the active medication list: yes  Last refill:  09/28/21 for zyrtec, 11/12/21 for others  Future visit scheduled: yes 01/09/22  Notes to clinic:  pt is due for OV and updated labs. Pt has already been given 30 DS. Please advise if pt can get refill until appt.      Requested Prescriptions  Pending Prescriptions Disp Refills   cetirizine (ZYRTEC) 10 MG tablet 30 tablet 0    Sig: TAKE 1 TABLET (10 MG TOTAL) BY MOUTH DAILY.     Ear, Nose, and Throat:  Antihistamines 2 Passed - 12/09/2021  6:42 PM      Passed - Cr in normal range and within 360 days    Creat  Date Value Ref Range Status  05/20/2013 0.91 0.50 - 1.35 mg/dL Final   Creatinine, Ser  Date Value Ref Range Status  01/31/2021 0.92 0.76 - 1.27 mg/dL Final         Passed - Valid encounter within last 12 months    Recent Outpatient Visits           10 months ago Prediabetes   [place], Vernia Buff, NP   10 months ago Need for immunization against influenza   [place], Dionne [place], [place]   1 year ago Essential hypertension   [place], [place], [place]   2 years ago Morbid obesity with BMI of 50.0-59.9, adult [facility])   [place], Vernia Buff, NP   2 years ago Encounter for annual physical exam   [place], Vernia Buff, NP       Future Appointments             In 1 month Gildardo Pounds, NP [place]             albuterol (VENTOLIN HFA) 108 (90 Base) MCG/ACT inhaler 6.7 g 0    Sig: INHALE 1-2 PUFFS INTO THE LUNGS EVERY 6 (SIX) HOURS AS NEEDED FOR WHEEZING OR SHORTNESS OF BREATH (COUGHING).  *Must have office visit for refills*     Pulmonology:  Beta Agonists 2 Passed - 12/09/2021  6:42 PM      Passed - Last BP  in normal range    BP Readings from Last 1 Encounters:  01/31/21 129/88         Passed - Last Heart Rate in normal range    Pulse Readings from Last 1 Encounters:  01/31/21 (!) 104         Passed - Valid encounter within last 12 months    Recent Outpatient Visits           10 months ago Prediabetes   [place], Vernia Buff, NP   10 months ago Need for immunization against influenza   [place], Dionne Bu[place], [place]   1 year ago Essential hypertension   [place], [place], [place]   2 years ago Morbid obesity with BMI of 50.0-59.9, adult [facility])   [place], Vernia Buff, NP   2 years ago Encounter for annual physical exam   [place], Vernia Buff, NP  Future Appointments             In 1 month Gildardo Pounds, NP [place]             losartan-hydrochlorothiazide (HYZAAR) 100-25 MG tablet 30 tablet 0    Sig: Take 1 tablet by mouth daily.     Cardiovascular: ARB + Diuretic Combos Failed - 12/09/2021  6:42 PM      Failed - K in normal range and within 180 days    Potassium  Date Value Ref Range Status  01/31/2021 4.1 3.5 - 5.2 mmol/L Final         Failed - Na in normal range and within 180 days    Sodium  Date Value Ref Range Status  01/31/2021 137 134 - 144 mmol/L Final         Failed - Cr in normal range and within 180 days    Creat  Date Value Ref Range Status  05/20/2013 0.91 0.50 - 1.35 mg/dL Final   Creatinine, Ser  Date Value Ref Range Status  01/31/2021 0.92 0.76 - 1.27 mg/dL Final         Failed - eGFR is 10 or above and within 180 days    GFR, Est African American  Date Value Ref Range Status  05/20/2013 >89 mL/min Final   GFR calc Af Amer  Date Value Ref Range Status  06/07/2019 104 >59 mL/min/1.73 Final    Comment:     **[facility] currently reports eGFR in compliance with the current**   recommendations of the [employer]. [facility] will   update reporting as new guidelines are published from the NKF-ASN   Task force.    GFR, Est Non African American  Date Value Ref Range Status  05/20/2013 >89 mL/min Final    Comment:      The estimated GFR is a calculation valid for adults (>=58 years old) that uses the CKD-EPI algorithm to adjust for age and sex. It is   not to be used for children, pregnant women, hospitalized patients,    patients on dialysis, or with rapidly changing kidney function. According to the NKDEP, eGFR >89 is normal, 60-89 shows mild impairment, 30-59 shows moderate impairment, 15-29 shows severe impairment and <15 is ESRD.     GFR calc non Af Amer  Date Value Ref Range Status  06/07/2019 90 >59 mL/min/1.73 Final   eGFR  Date Value Ref Range Status  01/31/2021 103 >59 mL/min/1.73 Final         Failed - Valid encounter within last 6 months    Recent Outpatient Visits           10 months ago Prediabetes   [place], Vernia Buff, NP   10 months ago Need for immunization against influenza   [place], Dionne [place], [place]   1 year ago Essential hypertension   [place], [place], [place]   2 years ago Morbid obesity with BMI of 50.0-59.9, adult [facility])   [place], Vernia Buff, NP   2 years ago Encounter for annual physical exam   [place], Vernia Buff, NP       Future Appointments             In 1 month Gildardo Pounds, NP [place]  Passed - Patient is not pregnant      Passed - Last BP in normal range    BP Readings from Last 1 Encounters:  01/31/21 129/88          metoprolol succinate (TOPROL-XL) 50 MG 24 hr tablet 30 tablet  0    Sig: Take 1 tablet (50 mg total) by mouth daily.     Cardiovascular:  Beta Blockers Failed - 12/09/2021  6:42 PM      Failed - Valid encounter within last 6 months    Recent Outpatient Visits           10 months ago Prediabetes   [place], Vernia Buff, NP   10 months ago Need for immunization against influenza   [place], Dionne [place], [place]   1 year ago Essential hypertension   [place], [place], [place]   2 years ago Morbid obesity with BMI of 50.0-59.9, adult [facility])   [place], Vernia Buff, NP   2 years ago Encounter for annual physical exam   [place] Gildardo Pounds, NP       Future Appointments             In 1 month Gildardo Pounds, NP [place] BP in normal range    BP Readings from Last 1 Encounters:  01/31/21 129/88         Passed - Last Heart Rate in normal range    Pulse Readings from Last 1 Encounters:  01/31/21 (!) 104

## 2021-12-11 ENCOUNTER — Other Ambulatory Visit: Payer: Self-pay

## 2021-12-14 ENCOUNTER — Other Ambulatory Visit: Payer: Self-pay

## 2022-01-09 ENCOUNTER — Ambulatory Visit: Payer: Self-pay | Attending: Nurse Practitioner | Admitting: Nurse Practitioner

## 2022-01-09 ENCOUNTER — Other Ambulatory Visit: Payer: Self-pay

## 2022-01-09 ENCOUNTER — Encounter: Payer: Self-pay | Admitting: Nurse Practitioner

## 2022-01-09 VITALS — BP 131/80 | HR 106 | Ht 70.0 in | Wt 393.6 lb

## 2022-01-09 DIAGNOSIS — I10 Essential (primary) hypertension: Secondary | ICD-10-CM

## 2022-01-09 DIAGNOSIS — J4521 Mild intermittent asthma with (acute) exacerbation: Secondary | ICD-10-CM

## 2022-01-09 DIAGNOSIS — N529 Male erectile dysfunction, unspecified: Secondary | ICD-10-CM

## 2022-01-09 DIAGNOSIS — Z1211 Encounter for screening for malignant neoplasm of colon: Secondary | ICD-10-CM

## 2022-01-09 DIAGNOSIS — K5909 Other constipation: Secondary | ICD-10-CM

## 2022-01-09 DIAGNOSIS — R000 Tachycardia, unspecified: Secondary | ICD-10-CM

## 2022-01-09 DIAGNOSIS — R7303 Prediabetes: Secondary | ICD-10-CM

## 2022-01-09 LAB — POCT GLYCOSYLATED HEMOGLOBIN (HGB A1C): HbA1c, POC (controlled diabetic range): 5.8 % (ref 0.0–7.0)

## 2022-01-09 MED ORDER — ALBUTEROL SULFATE HFA 108 (90 BASE) MCG/ACT IN AERS
2.0000 | INHALATION_SPRAY | Freq: Four times a day (QID) | RESPIRATORY_TRACT | 2 refills | Status: DC | PRN
  Filled 2022-01-09: qty 16, fill #0

## 2022-01-09 MED ORDER — PREDNISONE 20 MG PO TABS
40.0000 mg | ORAL_TABLET | Freq: Every day | ORAL | 0 refills | Status: AC
  Filled 2022-01-09: qty 14, 7d supply, fill #0

## 2022-01-09 MED ORDER — LOSARTAN POTASSIUM-HCTZ 100-25 MG PO TABS
1.0000 | ORAL_TABLET | Freq: Every day | ORAL | 1 refills | Status: DC
  Filled 2022-01-09: qty 30, 30d supply, fill #0
  Filled 2022-02-07 – 2022-02-20 (×2): qty 30, 30d supply, fill #1
  Filled 2022-04-09: qty 30, 30d supply, fill #2
  Filled 2022-05-07: qty 30, 30d supply, fill #3

## 2022-01-09 MED ORDER — TRULICITY 1.5 MG/0.5ML ~~LOC~~ SOAJ
1.5000 mg | SUBCUTANEOUS | 3 refills | Status: DC
  Filled 2022-01-09: qty 2, 28d supply, fill #0
  Filled 2022-02-07 – 2022-05-08 (×6): qty 2, 28d supply, fill #1

## 2022-01-09 MED ORDER — SILDENAFIL CITRATE 100 MG PO TABS
100.0000 mg | ORAL_TABLET | ORAL | 3 refills | Status: DC | PRN
  Filled 2022-01-09: qty 10, 30d supply, fill #0
  Filled 2022-02-07 – 2022-02-20 (×2): qty 10, 30d supply, fill #1
  Filled 2022-04-09: qty 10, 30d supply, fill #2
  Filled 2022-05-07: qty 10, 30d supply, fill #3

## 2022-01-09 MED ORDER — SENNOSIDES-DOCUSATE SODIUM 8.6-50 MG PO TABS
2.0000 | ORAL_TABLET | Freq: Every day | ORAL | 1 refills | Status: DC
  Filled 2022-01-09: qty 100, 50d supply, fill #0
  Filled 2022-02-07 – 2022-02-20 (×2): qty 100, 50d supply, fill #1

## 2022-01-09 MED ORDER — METOPROLOL SUCCINATE ER 50 MG PO TB24
50.0000 mg | ORAL_TABLET | Freq: Every day | ORAL | 1 refills | Status: DC
  Filled 2022-01-09: qty 30, 30d supply, fill #0
  Filled 2022-02-07 – 2022-02-20 (×2): qty 30, 30d supply, fill #1
  Filled 2022-04-09: qty 30, 30d supply, fill #2
  Filled 2022-05-07: qty 30, 30d supply, fill #3

## 2022-01-09 MED ORDER — FLUTICASONE FUROATE-VILANTEROL 100-25 MCG/ACT IN AEPB
1.0000 | INHALATION_SPRAY | Freq: Every day | RESPIRATORY_TRACT | 6 refills | Status: DC
  Filled 2022-01-09: qty 90, 90d supply, fill #0
  Filled 2022-01-11: qty 60, 30d supply, fill #0
  Filled 2022-02-07 – 2022-02-20 (×2): qty 60, 30d supply, fill #1
  Filled 2022-04-09: qty 60, 30d supply, fill #2
  Filled 2022-05-07: qty 60, 30d supply, fill #3

## 2022-01-09 NOTE — Progress Notes (Signed)
No concerns. 

## 2022-01-09 NOTE — Progress Notes (Signed)
Assessment & Plan:  Samuel [name] was seen today for prediabetes.  Diagnoses and all orders for this visit:  Prediabetes -     POCT glycosylated hemoglobin (Hb A1C) -     Dulaglutide (TRULICITY) 1.5 MG/0.5ML SOPN; Inject 1.5 mg into the skin once a week. -     losartan-hydrochlorothiazide (HYZAAR) 100-25 MG tablet; Take 1 tablet by mouth daily. -     metoprolol succinate (TOPROL-XL) 50 MG 24 hr tablet; Take 1 tablet (50 mg total) by mouth daily. Continue blood sugar control as discussed in office today, low carbohydrate diet, and regular physical exercise as tolerated, 150 minutes per week (30 min each day, 5 days per week, or 50 min 3 days per week). Keep blood sugar logs with fasting goal of 90-130 mg/dl, post prandial (after you eat) less than 180.  For Hypoglycemia: BS <60 and Hyperglycemia BS >400; contact the clinic ASAP. Annual eye exams and foot exams are recommended.   Mild intermittent asthma with acute exacerbation -     fluticasone furoate-vilanterol (BREO ELLIPTA) 100-25 MCG/ACT AEPB; Inhale 1 puff into the lungs daily. Please fill as a 90 day supply -     predniSONE (DELTASONE) 20 MG tablet; Take 2 tablets (40 mg total) by mouth daily with breakfast for 7 days. -     albuterol (VENTOLIN HFA) 108 (90 Base) MCG/ACT inhaler; Inhale 2 puffs into the lungs every 6 (six) hours as needed for wheezing or shortness of breath. NEEDS VENTOLIN. Recent inhaler did not work. Needs the blue rescue inhaler  Essential hypertension -     losartan-hydrochlorothiazide (HYZAAR) 100-25 MG tablet; Take 1 tablet by mouth daily. -     metoprolol succinate (TOPROL-XL) 50 MG 24 hr tablet; Take 1 tablet (50 mg total) by mouth daily. Continue all antihypertensives as prescribed.  Reminded to bring in blood pressure log for follow  up appointment.  RECOMMENDATIONS: DASH/Mediterranean Diets are healthier choices for HTN.    Tachycardia -     metoprolol succinate (TOPROL-XL) 50 MG 24 hr tablet; Take 1 tablet  (50 mg total) by mouth daily.  Erectile dysfunction, unspecified erectile dysfunction type -     sildenafil (VIAGRA) 100 MG tablet; Take 1 tablet (100 mg total) by mouth as needed for erectile dysfunction.  Colon cancer screening -     Fecal occult blood, imunochemical  Chronic constipation -     senna-docusate (SENOKOT-S) 8.6-50 MG tablet; Take 2 tablets by mouth daily.    Patient has been counseled on age-appropriate routine health concerns for screening and prevention. These are reviewed and up-to-date. Referrals have been placed accordingly. Immunizations are up-to-date or declined.    Subjective:   Chief Complaint  Patient presents with   Prediabetes   HPI DANISH RUFFINS 51 y.o. male presents to office today for follow up to prediabetes and HTN.  Prediabetes Well controlled. He is no longer taking metformin due to GI upset. Currently taking trulicity 1.5 mg weekly.  Lab Results  Component Value Date   HGBA1C 5.8 01/09/2022    HTN Blood pressure is well controlled with hyzaar 100-25 mg daily and Toprol XL 50 mg daily. He does not monitor his blood pressure at home.  BP Readings from Last 3 Encounters:  01/09/22 131/80  01/31/21 129/88  05/25/20 (!) 183/79     Chronic constipation He endorses constipation and intermittent abdominal pain. He also has a large ventral hernia that needs to be evaluated. He denies N/V, hematochezia, melena.   Asthma Exacerbation:  He presents with an asthma exacerbation. This exacerbation began several days ago.  Associated symptoms include nonproductive cough, wheezing, and shortness of breath .  Suspected precipitants include cold air and occupational exposure.  Symptoms have been gradually worsening since their onset.  This is the first evaluation that has occurred during this exacerbation. He has treated this current exacerbation with short-acting inhaled beta-adrenergic agonists. The patient reports adherence to this regimen.  Unfortunately he has been out of his maintenance inhaler (advair was switched to breo due to cost effectiveness). He will be picking it up today.    Review of Systems  Constitutional:  Negative for fever, malaise/fatigue and weight loss.  HENT: Negative.  Negative for nosebleeds.   Eyes: Negative.  Negative for blurred vision, double vision and photophobia.  Respiratory: Negative.  Negative for cough and shortness of breath.   Cardiovascular: Negative.  Negative for chest pain, palpitations and leg swelling.  Gastrointestinal:  Positive for abdominal pain and constipation. Negative for blood in stool, diarrhea, heartburn, melena, nausea and vomiting.  Musculoskeletal: Negative.  Negative for myalgias.  Neurological: Negative.  Negative for dizziness, focal weakness, seizures and headaches.  Psychiatric/Behavioral: Negative.  Negative for suicidal ideas.     Past Medical History:  Diagnosis Date   Asthma    Hypertension    Prediabetes    Ulcer    venous insufficiency    History reviewed. No pertinent surgical history.  Family History  Problem Relation Age of Onset   Asthma Mother    Hypertension Mother     Social History Reviewed with no changes to be made today.   Outpatient Medications Prior to Visit  Medication Sig Dispense Refill   cetirizine (ZYRTEC) 10 MG tablet TAKE 1 TABLET (10 MG TOTAL) BY MOUTH DAILY. 30 tablet 0   ipratropium-albuterol (DUONEB) 0.5-2.5 (3) MG/3ML SOLN TAKE 3 MLS BY NEBULIZATION EVERY 6 (SIX) HOURS AS NEEDED FOR SHORTNESS OF BREATH 360 mL 0   albuterol (VENTOLIN HFA) 108 (90 Base) MCG/ACT inhaler INHALE 1-2 PUFFS INTO THE LUNGS EVERY 6 (SIX) HOURS AS NEEDED FOR WHEEZING OR SHORTNESS OF BREATH (COUGHING).  *Must have office visit for refills* 6.7 g 0   Dulaglutide (TRULICITY) 1.5 MG/0.5ML SOPN Inject 1.5 mg into the skin once a week. 2 mL 0   losartan-hydrochlorothiazide (HYZAAR) 100-25 MG tablet Take 1 tablet by mouth daily. 30 tablet 0   metFORMIN  (GLUCOPHAGE) 500 MG tablet Take 1 tablet (500 mg total) by mouth 2 (two) times daily with a meal. 180 tablet 3   metoprolol succinate (TOPROL-XL) 50 MG 24 hr tablet Take 1 tablet (50 mg total) by mouth daily. 30 tablet 0   sildenafil (VIAGRA) 100 MG tablet Take 1 tablet (100 mg total) by mouth as needed for erectile dysfunction. 10 tablet 3   fluticasone furoate-vilanterol (BREO ELLIPTA) 100-25 MCG/ACT AEPB Inhale 1 puff into the lungs daily. (Patient not taking: Reported on 01/09/2022) 60 each 0   No facility-administered medications prior to visit.    Allergies  Allergen Reactions   Shrimp [Shellfish Allergy]     Lip swell up.        Objective:    BP 131/80   Pulse (!) 106   Ht 5\' 10"  (1.778 m)   Wt (!) 393 lb 9.6 oz (178.5 kg)   SpO2 96%   BMI 56.48 kg/m  Wt Readings from Last 3 Encounters:  01/09/22 (!) 393 lb 9.6 oz (178.5 kg)  01/31/21 (!) 392 lb (177.8 kg)  05/25/20 05/27/20)  391 lb 12.8 oz (177.7 kg)    Physical Exam       Patient has been counseled extensively about nutrition and exercise as well as the importance of adherence with medications and regular follow-up. The patient was given clear instructions to go to ER or return to medical center if symptoms don't improve, worsen or new problems develop. The patient verbalized understanding.   Follow-up: Return in about 3 months (around 04/10/2022) for physical and labs.   Claiborne Rigg, FNP-BC [facility] [place], [place] [phone]   01/09/2022, 5:53 PM

## 2022-01-10 ENCOUNTER — Other Ambulatory Visit: Payer: Self-pay | Admitting: Pharmacist

## 2022-01-10 ENCOUNTER — Other Ambulatory Visit: Payer: Self-pay

## 2022-01-10 MED ORDER — ALBUTEROL SULFATE HFA 108 (90 BASE) MCG/ACT IN AERS
2.0000 | INHALATION_SPRAY | Freq: Four times a day (QID) | RESPIRATORY_TRACT | 2 refills | Status: DC | PRN
  Filled 2022-01-10: qty 6.7, 25d supply, fill #0
  Filled 2022-02-07 – 2022-02-20 (×2): qty 6.7, 25d supply, fill #1
  Filled 2022-04-09: qty 6.7, 25d supply, fill #2

## 2022-01-11 ENCOUNTER — Other Ambulatory Visit: Payer: Self-pay

## 2022-01-15 ENCOUNTER — Other Ambulatory Visit: Payer: Self-pay

## 2022-02-01 ENCOUNTER — Other Ambulatory Visit: Payer: Self-pay

## 2022-02-07 ENCOUNTER — Other Ambulatory Visit: Payer: Self-pay

## 2022-02-07 ENCOUNTER — Other Ambulatory Visit: Payer: Self-pay | Admitting: Nurse Practitioner

## 2022-02-07 ENCOUNTER — Other Ambulatory Visit: Payer: Self-pay | Admitting: Family Medicine

## 2022-02-07 DIAGNOSIS — Z9109 Other allergy status, other than to drugs and biological substances: Secondary | ICD-10-CM

## 2022-02-07 DIAGNOSIS — J449 Chronic obstructive pulmonary disease, unspecified: Secondary | ICD-10-CM

## 2022-02-07 MED ORDER — CETIRIZINE HCL 10 MG PO TABS
10.0000 mg | ORAL_TABLET | Freq: Every day | ORAL | 0 refills | Status: DC
  Filled 2022-02-07 – 2022-05-07 (×2): qty 30, 30d supply, fill #0

## 2022-02-07 MED ORDER — IPRATROPIUM-ALBUTEROL 0.5-2.5 (3) MG/3ML IN SOLN
3.0000 mL | Freq: Four times a day (QID) | RESPIRATORY_TRACT | 0 refills | Status: DC | PRN
  Filled 2022-02-07 – 2022-04-09 (×2): qty 360, 30d supply, fill #0

## 2022-02-07 NOTE — Telephone Encounter (Signed)
Requested Prescriptions  Pending Prescriptions Disp Refills   ipratropium-albuterol (DUONEB) 0.5-2.5 (3) MG/3ML SOLN 360 mL 0    Sig: TAKE 3 MLS BY NEBULIZATION EVERY 6 (SIX) HOURS AS NEEDED FOR SHORTNESS OF BREATH     Pulmonology:  Combination Products - albuterol / ipratropium Passed - 02/07/2022  1:15 PM      Passed - Last BP in normal range    BP Readings from Last 1 Encounters:  01/09/22 131/80         Passed - Last Heart Rate in normal range    Pulse Readings from Last 1 Encounters:  01/09/22 (!) 106         Passed - Valid encounter within last 12 months    Recent Outpatient Visits           4 weeks ago Prediabetes   [place], Vernia Buff, NP   1 year ago Prediabetes   [place], Vernia Buff, NP   1 year ago Need for immunization against influenza   [place], [place]   1 year ago Essential hypertension   [place], [place], [place]   2 years ago Morbid obesity with BMI of 50.0-59.9, adult [facility])   [place], Vernia Buff, NP       Future Appointments             In 2 months Gildardo Pounds, NP [place]

## 2022-02-07 NOTE — Telephone Encounter (Signed)
Requested medication (s) are due for refill today:   Yes  Requested medication (s) are on the active medication list:   Yes  Future visit scheduled:   Yes   Last ordered: 12/10/2021 #30, 0 refills  Returned because Cr. Due per protocol.     Requested Prescriptions  Pending Prescriptions Disp Refills   cetirizine (ZYRTEC) 10 MG tablet 30 tablet 0    Sig: TAKE 1 TABLET (10 MG TOTAL) BY MOUTH DAILY.     Ear, Nose, and Throat:  Antihistamines 2 Failed - 02/07/2022  1:15 PM      Failed - Cr in normal range and within 360 days    Creat  Date Value Ref Range Status  05/20/2013 0.91 0.50 - 1.35 mg/dL Final   Creatinine, Ser  Date Value Ref Range Status  01/31/2021 0.92 0.76 - 1.27 mg/dL Final         Passed - Valid encounter within last 12 months    Recent Outpatient Visits           4 weeks ago Prediabetes   [place], Vernia Buff, NP   1 year ago Prediabetes   [place], Vernia Buff, NP   1 year ago Need for immunization against influenza   [place], [place]   1 year ago Essential hypertension   [place], [place], [place]   2 years ago Morbid obesity with BMI of 50.0-59.9, adult [facility])   [place], Vernia Buff, NP       Future Appointments             In 2 months Gildardo Pounds, NP [place]

## 2022-02-08 ENCOUNTER — Other Ambulatory Visit: Payer: Self-pay

## 2022-02-11 ENCOUNTER — Other Ambulatory Visit: Payer: Self-pay

## 2022-02-12 ENCOUNTER — Other Ambulatory Visit: Payer: Self-pay

## 2022-02-12 ENCOUNTER — Ambulatory Visit: Payer: Self-pay | Admitting: *Deleted

## 2022-02-12 NOTE — Telephone Encounter (Signed)
Reason for Disposition  Caller has medicine question only, adult not sick, AND triager answers question  Answer Assessment - Initial Assessment Questions 1. NAME of MEDICINE: "What medicine(s) are you calling about?"     I need his medication list.   [facility] is in the [facility] for the [facility].   [place] nurse with the [facility] center. 2. QUESTION: "What is your question?" (e.g., double dose of medicine, side effect)     I need his list of medications.   Pt. Is there too and call is on speaker phone so he could hear too. 3. PRESCRIBER: "Who prescribed the medicine?" Reason: if prescribed by specialist, call should be referred to that group.     Geryl Rankins, NP 4. SYMPTOMS: "Do you have any symptoms?" If Yes, ask: "What symptoms are you having?"  "How bad are the symptoms (e.g., mild, moderate, severe)     N/A 5. PREGNANCY:  "Is there any chance that you are pregnant?" "When was your last menstrual period?"     N/A  I went over his list of medications with doses and how many times they are to be taken.  Protocols used: Medication Question Call-A-AH

## 2022-02-12 NOTE — Telephone Encounter (Signed)
  Chief Complaint: Nurse from the [facility] needing his list of medications because he is going to be there overnight.  Pt. Was there with her on speaker phone as I gave her his list of medications. Symptoms: N/A Frequency: N/A Pertinent Negatives: Patient denies N/A Disposition: [] ED /[] Urgent Care (no appt availability in office) / [] Appointment(In office/virtual)/ []  [place] [facility]/ [x] Home Care/ [] Refused Recommended Disposition /[] [place] [facility]/ []  Follow-up with PCP Additional Notes: Philis Nettle thanked me for this information.

## 2022-02-13 ENCOUNTER — Other Ambulatory Visit: Payer: Self-pay

## 2022-02-14 ENCOUNTER — Other Ambulatory Visit: Payer: Self-pay | Admitting: Nurse Practitioner

## 2022-02-14 ENCOUNTER — Other Ambulatory Visit: Payer: Self-pay

## 2022-02-18 ENCOUNTER — Other Ambulatory Visit: Payer: Self-pay

## 2022-02-19 ENCOUNTER — Other Ambulatory Visit: Payer: Self-pay

## 2022-02-20 ENCOUNTER — Other Ambulatory Visit: Payer: Self-pay

## 2022-02-26 ENCOUNTER — Other Ambulatory Visit: Payer: Self-pay

## 2022-04-09 ENCOUNTER — Other Ambulatory Visit: Payer: Self-pay

## 2022-04-10 ENCOUNTER — Ambulatory Visit: Payer: Self-pay | Attending: Nurse Practitioner | Admitting: Nurse Practitioner

## 2022-04-12 ENCOUNTER — Other Ambulatory Visit: Payer: Self-pay

## 2022-05-07 ENCOUNTER — Other Ambulatory Visit: Payer: Self-pay | Admitting: Nurse Practitioner

## 2022-05-07 ENCOUNTER — Other Ambulatory Visit: Payer: Self-pay | Admitting: Family Medicine

## 2022-05-07 ENCOUNTER — Encounter: Payer: Self-pay | Admitting: Nurse Practitioner

## 2022-05-07 ENCOUNTER — Other Ambulatory Visit: Payer: Self-pay

## 2022-05-07 DIAGNOSIS — J449 Chronic obstructive pulmonary disease, unspecified: Secondary | ICD-10-CM

## 2022-05-07 MED ORDER — ALBUTEROL SULFATE HFA 108 (90 BASE) MCG/ACT IN AERS
2.0000 | INHALATION_SPRAY | Freq: Four times a day (QID) | RESPIRATORY_TRACT | 0 refills | Status: DC | PRN
  Filled 2022-05-07: qty 6.7, 25d supply, fill #0

## 2022-05-08 ENCOUNTER — Other Ambulatory Visit: Payer: Self-pay

## 2022-05-08 ENCOUNTER — Other Ambulatory Visit: Payer: Self-pay | Admitting: Nurse Practitioner

## 2022-05-08 DIAGNOSIS — K429 Umbilical hernia without obstruction or gangrene: Secondary | ICD-10-CM

## 2022-05-08 DIAGNOSIS — L97911 Non-pressure chronic ulcer of unspecified part of right lower leg limited to breakdown of skin: Secondary | ICD-10-CM

## 2022-05-08 DIAGNOSIS — I872 Venous insufficiency (chronic) (peripheral): Secondary | ICD-10-CM

## 2022-05-08 MED ORDER — CEPHALEXIN 500 MG PO CAPS
500.0000 mg | ORAL_CAPSULE | Freq: Four times a day (QID) | ORAL | 0 refills | Status: AC
Start: 2022-05-08 — End: 2022-05-17
  Filled 2022-05-08: qty 28, 7d supply, fill #0

## 2022-05-09 ENCOUNTER — Other Ambulatory Visit: Payer: Self-pay

## 2022-05-10 ENCOUNTER — Other Ambulatory Visit: Payer: Self-pay

## 2022-05-13 NOTE — Progress Notes (Unsigned)
Patient ID: Samuel [name], male   DOB: Feb 11, 1970, 52 y.o.   MRN: [id number]  Chief Complaint: Umbilical hernia x 5 years  History of Present Illness Samuel [name] is a 52 y.o. male with a supraumbilical bulging.  Denies nausea, vomiting, fevers or chills.  Denies any pain.  Suspects bulging is worse after eating a big meal.  Past Medical History Past Medical History:  Diagnosis Date   Asthma    Hypertension    Prediabetes    Ulcer    venous insufficiency      History reviewed. No pertinent surgical history.  Allergies  Allergen Reactions   Shrimp [Shellfish Allergy]     Lip swell up.     Current Outpatient Medications  Medication Sig Dispense Refill   albuterol (PROVENTIL HFA) 108 (90 Base) MCG/ACT inhaler Inhale 2 puffs into the lungs every 6 (six) hours as needed for wheezing or shortness of breath. 6.7 g 0   cephALEXin (KEFLEX) 500 MG capsule Take 1 capsule (500 mg total) by mouth 4 (four) times daily for 7 days. 28 capsule 0   cetirizine (ZYRTEC) 10 MG tablet Take 1 tablet (10 mg total) by mouth daily. 30 tablet 0   Dulaglutide (TRULICITY) 1.5 MG/0.5ML SOPN Inject 1.5 mg into the skin once a week. 6 mL 3   fluticasone furoate-vilanterol (BREO ELLIPTA) 100-25 MCG/ACT AEPB Inhale 1 puff into the lungs daily. 60 each 6   ipratropium-albuterol (DUONEB) 0.5-2.5 (3) MG/3ML SOLN TAKE 3 MLS BY NEBULIZATION EVERY 6 (SIX) HOURS AS NEEDED FOR SHORTNESS OF BREATH 360 mL 0   losartan-hydrochlorothiazide (HYZAAR) 100-25 MG tablet Take 1 tablet by mouth daily. 90 tablet 1   metoprolol succinate (TOPROL-XL) 50 MG 24 hr tablet Take 1 tablet (50 mg total) by mouth daily. 90 tablet 1   senna-docusate (SENOKOT-S) 8.6-50 MG tablet Take 2 tablets by mouth daily. 180 tablet 1   sildenafil (VIAGRA) 100 MG tablet Take 1 tablet (100 mg total) by mouth as needed for erectile dysfunction. 10 tablet 3   No current facility-administered medications for this visit.    Family History Family  History  Problem Relation Age of Onset   Asthma Mother    Hypertension Mother       Social History Social History   Tobacco Use   Smoking status: Former    Packs/day: 1.00    Years: 15.00    Additional pack years: 0.00    Total pack years: 15.00    Types: Cigarettes    Quit date: 10/28/2012    Years since quitting: 9.5   Smokeless tobacco: Never  Vaping Use   Vaping Use: Never used  Substance Use Topics   Alcohol use: Yes    Alcohol/week: 4.0 standard drinks of alcohol    Types: 2 Cans of beer, 2 Shots of liquor per week   Drug use: No        Review of Systems  Constitutional: Negative.   HENT: Negative.    Eyes: Negative.   Respiratory: Negative.    Cardiovascular: Negative.   Gastrointestinal: Negative.   Genitourinary: Negative.   Skin: Negative.   Neurological: Negative.   Psychiatric/Behavioral: Negative.       Physical Exam Blood pressure (!) 147/86, pulse (!) 114, temperature 99 F (37.2 C), temperature source Oral, height  (1.778 m), weight (!) 377 lb 9.6 oz (171.3 kg), SpO2 96 %. Last Weight  Most recent update: 05/14/2022  2:31 PM    Weight  171.3 kg (377  lb 9.6 oz)               CONSTITUTIONAL: Well developed, and nourished, appropriately responsive and aware without distress. Morbidly obese male.   EYES: Sclera non-icteric.   EARS, NOSE, MOUTH AND THROAT:  The oropharynx is clear. Oral mucosa is pink and moist.     Hearing is intact to voice.  NECK: Trachea is midline, and there is no jugular venous distension.  LYMPH NODES:  Lymph nodes in the neck are not appreciated. RESPIRATORY:  Lungs are clear, and breath sounds are equal bilaterally.  Normal respiratory effort without pathologic use of accessory muscles. CARDIOVASCULAR: Heart is regular in rate and rhythm.   Well perfused.  GI: The abdomen is obese, with supraumbilical mass that is not reducible, and yet minimally tender.  Otherwise his abdomen is soft, nontender, and  nondistended.  I did not appreciate hepatosplenomegaly.  MUSCULOSKELETAL:  Symmetrical muscle tone appreciated in all four extremities.    SKIN: Skin turgor is normal. No pathologic skin lesions appreciated.  NEUROLOGIC:  Motor and sensation appear grossly normal.  Cranial nerves are grossly without defect. PSYCH:  Alert and oriented to person, place and time. Affect is appropriate for situation.  Data Reviewed I have personally reviewed what is currently available of the patient's imaging, recent labs and medical records.   Labs:     Latest Ref Rng & Units 01/31/2021    9:05 AM 05/25/2020    4:37 PM 03/09/2019    4:16 PM  CBC  WBC 3.4 - 10.8 x10E3/uL 7.9  7.3  8.4   Hemoglobin 13.0 - 17.7 g/dL 56.4  33.2  95.1   Hematocrit 37.5 - 51.0 % 43.6  41.6  41.9   Platelets 150 - 450 x10E3/uL 336  338  370       Latest Ref Rng & Units 01/31/2021    9:05 AM 05/25/2020    4:37 PM 06/07/2019    9:57 AM  CMP  Glucose 70 - 99 mg/dL 97  90  92   BUN 6 - 24 mg/dL 10  8  11    Creatinine 0.76 - 1.27 mg/dL 8.84  1.66  0.63   Sodium 134 - 144 mmol/L 137  139  140   Potassium 3.5 - 5.2 mmol/L 4.1  4.3  4.3   Chloride 96 - 106 mmol/L 99  101  100   CO2 20 - 29 mmol/L 25  24  24    Calcium 8.7 - 10.2 mg/dL 9.2  9.0  9.4   Total Protein 6.0 - 8.5 g/dL 7.1  7.1    Total Bilirubin 0.0 - 1.2 mg/dL 0.3  0.4    Alkaline Phos 44 - 121 IU/L 78  78    AST 0 - 40 IU/L 15  13    ALT 0 - 44 IU/L 15  18        Imaging: Radiological images reviewed:   Within last 24 hrs: No results found.  Assessment    Epigastric hernia, mild diastasis recti, Severe obesity.  No evidence clinically of bowel involvement, suspect incarcerated omentum/preperitoneal adipose.  Patient Active Problem List   Diagnosis Date Noted   Chronic pain of right knee 06/23/2018   Prediabetes 02/08/2014   Venous ulcer, limited to breakdown of skin 09/02/2013   Essential hypertension 09/02/2013   COPD exacerbation 05/20/2013   OSA  (obstructive sleep apnea) 05/20/2013   Lymphedema 02/02/2013   Ulcer of lower extremity 12/28/2012   Edema 12/21/2012  Preventative health care 11/18/2012   COPD GOLD II 10/24/2012   Class 3 severe obesity with serious comorbidity and body mass index (BMI) of 50.0 to 59.9 in adult 10/24/2012   Edema leg 10/24/2012    Plan    CT imaging.  Weight loss to set up elective hernia repair.  I discussed possibility of incarceration, strangulation, enlargement in size over time, and the need for emergency surgery in the face of these.  Also reviewed the techniques of reduction should incarceration occur, and when unsuccessful to present to the ED.  Also discussed that surgery risks include recurrence which can be up to 30% in the case of complex hernias, use of prosthetic materials (mesh) and the increased risk of infection and the possible need for re-operation and removal of mesh, possibility of post-op SBO or ileus, and the risks of general anesthetic including heart attack, stroke, sudden death or some reaction to anesthetic medications. The patient, and those present, appear to understand the risks, any and all questions were answered to the patient's satisfaction.  No guarantees were ever expressed or implied.   Face-to-face time spent with the patient and accompanying care providers(if present) was 40 minutes, with more than 50% of the time spent counseling, educating, and coordinating care of the patient.    These notes generated with voice recognition software. I apologize for typographical errors.  Campbell Lerner M.D., FACS 05/14/2022, 2:43 PM

## 2022-05-14 ENCOUNTER — Ambulatory Visit (INDEPENDENT_AMBULATORY_CARE_PROVIDER_SITE_OTHER): Payer: 59 | Admitting: Surgery

## 2022-05-14 ENCOUNTER — Encounter: Payer: Self-pay | Admitting: Surgery

## 2022-05-14 ENCOUNTER — Encounter (HOSPITAL_BASED_OUTPATIENT_CLINIC_OR_DEPARTMENT_OTHER): Payer: 59 | Attending: Internal Medicine | Admitting: Internal Medicine

## 2022-05-14 VITALS — BP 147/86 | HR 114 | Temp 99.0°F | Ht 70.0 in | Wt 377.6 lb

## 2022-05-14 DIAGNOSIS — K439 Ventral hernia without obstruction or gangrene: Secondary | ICD-10-CM | POA: Diagnosis not present

## 2022-05-14 DIAGNOSIS — I87313 Chronic venous hypertension (idiopathic) with ulcer of bilateral lower extremity: Secondary | ICD-10-CM | POA: Diagnosis not present

## 2022-05-14 DIAGNOSIS — I872 Venous insufficiency (chronic) (peripheral): Secondary | ICD-10-CM | POA: Insufficient documentation

## 2022-05-14 DIAGNOSIS — L97812 Non-pressure chronic ulcer of other part of right lower leg with fat layer exposed: Secondary | ICD-10-CM

## 2022-05-14 DIAGNOSIS — Z6841 Body Mass Index (BMI) 40.0 and over, adult: Secondary | ICD-10-CM | POA: Diagnosis not present

## 2022-05-14 DIAGNOSIS — I10 Essential (primary) hypertension: Secondary | ICD-10-CM | POA: Diagnosis not present

## 2022-05-14 DIAGNOSIS — J4489 Other specified chronic obstructive pulmonary disease: Secondary | ICD-10-CM | POA: Diagnosis not present

## 2022-05-14 DIAGNOSIS — R1011 Right upper quadrant pain: Secondary | ICD-10-CM

## 2022-05-14 DIAGNOSIS — L97822 Non-pressure chronic ulcer of other part of left lower leg with fat layer exposed: Secondary | ICD-10-CM | POA: Insufficient documentation

## 2022-05-14 DIAGNOSIS — M6208 Separation of muscle (nontraumatic), other site: Secondary | ICD-10-CM | POA: Diagnosis not present

## 2022-05-14 DIAGNOSIS — K436 Other and unspecified ventral hernia with obstruction, without gangrene: Secondary | ICD-10-CM

## 2022-05-14 DIAGNOSIS — G4733 Obstructive sleep apnea (adult) (pediatric): Secondary | ICD-10-CM | POA: Diagnosis not present

## 2022-05-14 NOTE — Patient Instructions (Addendum)
Your CT is scheduled for 05/28/2022 6pm (arrive by 5:45 pm) at [facility] on [address]. Drink 2 cups of water prior to CT.    A referral has been placed for [facility]. They will call you with an appointment.    If you have any concerns or questions, please feel free to call our office. Follow up in 6 months.   Hernia, Adult      A hernia happens when an organ or tissue inside your body pushes out through a weak spot in the muscles of your belly (abdomen). This makes a bulge. The bulge may be: In a scar from a surgery that was done in your belly (incisional hernia). Near your belly button (umbilical hernia). In your groin (inguinal hernia). Your groin is the area where your leg meets your lower belly. If you are a male, this type could also be in your scrotum. In your upper thigh (femoral hernia). Inside your belly (hiatal hernia). This happens when your stomach slides above the muscle between your belly and your chest (diaphragm). What are the causes? This condition may be caused by: Lifting heavy things. Coughing over a long period of time. Having trouble pooping (constipation). Trouble pooping can lead to straining. A cut from surgery in your belly. A physical problem that is present at birth. Being very overweight. Smoking. Too much fluid in your belly. A testicle that has not moved down into the scrotum, in males. What are the signs or symptoms? The main symptom is a bulge in the area of the hernia, but a bulge may not always be seen. It may grow bigger or be easier to see when you cough or strain (such as when lifting something heavy). A hernia that can be pushed back into the belly rarely causes pain. A hernia that cannot be pushed back into the belly may lose its blood supply. This may cause: Pain. Fever. A feeling like you may vomit, and vomiting. Swelling. Trouble pooping. How is this treated? A hernia that is small and painless may not need to be treated.  A hernia that is large or painful may be treated with surgery. Surgery to treat a hernia involves pushing the bulge back into place and repairing the weak area of the muscle or belly. Follow these instructions at home: Activity Avoid straining the muscles near your hernia. This can happen when you: Lift something heavy. Poop (have a bowel movement). Do not lift anything that is heavier than 10 lb (4.5 kg), or the limit that you are told. When you lift something heavy, use your leg muscles. Do not use your back muscles to lift. Prevent trouble pooping If told by your doctor, take steps to prevent trouble pooping. You may need to: Drink enough fluid to keep your pee (urine) pale yellow. Take medicines. You will be told what medicines to take. Eat foods that are high in fiber. These include beans, whole grains, and fresh fruits and vegetables. Limit foods that are high in fat and sugar. These include fried or sweet foods. General instructions When you cough, try to cough gently. You may try to push your hernia back in by gently pressing on it when you are lying down. Do not try to force the bulge back in if it will not go in easily. If you are overweight, work with your doctor to lose weight safely. Do not smoke or use any products that contain nicotine or tobacco. If you need help quitting, ask your doctor. If you  will be having surgery, watch your hernia for changes in shape, size, or color. Tell your doctor if you see any changes. Take over-the-counter and prescription medicines only as told by your doctor. Keep all follow-up visits. Contact a doctor if: You get new pain, swelling, or redness near your hernia. You poop fewer times in a week than normal. You have trouble pooping. You have poop that is more dry than normal. You have poop that is harder or larger than normal. Get help right away if: You have a fever or chills. You have belly pain that gets worse. You feel like you may  vomit, or you vomit. Your hernia cannot be pushed in by gently pressing on it when you are lying down. Your hernia: Changes in shape or size. Changes color. Feels hard, or it hurts when you touch it. These symptoms may be an emergency. Get help right away. Call your local emergency services (911 in the [place] Do not wait to see if the symptoms will go away. Do not drive yourself to the hospital. Summary A hernia happens when an organ or tissue inside your body pushes out through a weak spot in the belly muscles. This creates a bulge. If your hernia is small and it does not hurt, you may not need treatment. If your hernia is large or it hurts, you may need surgery. If you will be having surgery, watch your hernia for changes in shape, size, or color. Tell your doctor about any changes. This information is not intended to replace advice given to you by your health care provider. Make sure you discuss any questions you have with your health care provider. Document Revised: 08/23/2019 Document Reviewed: 08/23/2019 Elsevier Patient Education  2023 [employer].

## 2022-05-15 DIAGNOSIS — K436 Other and unspecified ventral hernia with obstruction, without gangrene: Secondary | ICD-10-CM | POA: Insufficient documentation

## 2022-05-15 NOTE — Progress Notes (Signed)
JOAH, PATLAN ([id number]) [id number] Page 1 of 11 Visit Report for 05/14/2022 Chief Complaint Document Details Patient Name: Date of Service: Samuel [name] 05/14/2022 8:45 Samuel M Medical Record Number: [id number] Patient Account Number: [id number] Date of Birth/Sex: Treating RN: 1970/09/17 (52 y.o. M) Primary Care Provider: Bertram Denver Other Clinician: Referring Provider: Treating Provider/Extender: Altamese Dilling Weeks in Treatment: 0 Information Obtained from: Patient Chief Complaint patient is here for review of wounds on his right medial lower leg and right lateral foot 06/27/17; patient returns to clinic today with Samuel reopening on his bilateral medial lower calfs Electronic Signature(s) Signed: 05/14/2022 10:18:30 AM By: Geralyn Corwin DO Entered By: Geralyn Corwin on 05/14/2022 09:47:22 -------------------------------------------------------------------------------- Debridement Details Patient Name: Date of Service: Samuel Starch G. 05/14/2022 8:45 Samuel M Medical Record Number: [id number] Patient Account Number: [id number] Date of Birth/Sex: Treating RN: 1970/06/21 (52 y.o. Harlon Flor, Yvonne Kendall Primary Care Provider: Bertram Denver Other Clinician: Referring Provider: Treating Provider/Extender: Altamese Dilling Weeks in Treatment: 0 Debridement Performed for Assessment: Wound #7 Right,Medial Lower Leg Performed By: Physician Geralyn Corwin, DO Debridement Type: Debridement Severity of Tissue Pre Debridement: Fat layer exposed Level of Consciousness (Pre-procedure): Awake and Alert Pre-procedure Verification/Time Out Yes - 09:35 Taken: Start Time: 09:36 Pain Control: Lidocaine 4% T opical Solution T Area Debrided (L x W): otal 2.9 (cm) x 1.9 (cm) = 5.51 (cm) Tissue and other material debrided: Viable, Non-Viable, Slough, Subcutaneous, Slough Level: Skin/Subcutaneous Tissue Debridement  Description: Excisional Instrument: Curette Bleeding: Minimum Hemostasis Achieved: Pressure End Time: 09:40 Procedural Pain: 0 Post Procedural Pain: 0 Response to Treatment: Procedure was tolerated well Level of Consciousness (Post- Awake and Alert procedure): Post Debridement Measurements of Total Wound Length: (cm) 2.9 Width: (cm) 1.9 Depth: (cm) 0.3 Volume: (cm) 1.298 Character of Wound/Ulcer Post Debridement: Improved Severity of Tissue Post Debridement: Fat layer exposed Post Procedure Diagnosis Same as Pre-procedure Electronic Signature(s) Signed: 05/14/2022 10:18:30 AM By: Geralyn Corwin DO Signed: 05/14/2022 4:54:11 PM By: Shawn Stall RN, BSN [place], Durwin Glaze ([id number]) [id number] Page 2 of 11 Entered By: Shawn Stall on 05/14/2022 09:40:49 -------------------------------------------------------------------------------- Debridement Details Patient Name: Date of Service: Samuel [name] 05/14/2022 8:45 Samuel M Medical Record Number: [id number] Patient Account Number: [id number] Date of Birth/Sex: Treating RN: Sep 10, 1970 (52 y.o. Harlon Flor, Yvonne Kendall Primary Care Provider: Bertram Denver Other Clinician: Referring Provider: Treating Provider/Extender: Altamese Dilling Weeks in Treatment: 0 Debridement Performed for Assessment: Wound #9 Left,Medial Lower Leg Performed By: Physician Geralyn Corwin, DO Debridement Type: Debridement Severity of Tissue Pre Debridement: Limited to breakdown of skin Level of Consciousness (Pre-procedure): Awake and Alert Pre-procedure Verification/Time Out Yes - 09:35 Taken: Start Time: 09:36 Pain Control: Lidocaine 4% T opical Solution T Area Debrided (L x W): otal 2.5 (cm) x 0.3 (cm) = 0.75 (cm) Tissue and other material debrided: Viable, Non-Viable, Eschar, Skin: Epidermis Level: Skin/Epidermis Debridement Description: Selective/Open Wound Instrument: Curette Bleeding:  Minimum Hemostasis Achieved: Pressure End Time: 09:40 Procedural Pain: 0 Post Procedural Pain: 0 Response to Treatment: Procedure was tolerated well Level of Consciousness (Post- Awake and Alert procedure): Post Debridement Measurements of Total Wound Length: (cm) 2.5 Width: (cm) 0.3 Depth: (cm) 0.1 Volume: (cm) 0.059 Character of Wound/Ulcer Post Debridement: Improved Severity of Tissue Post Debridement: Limited to breakdown of skin Post Procedure Diagnosis Same as Pre-procedure Electronic Signature(s) Signed: 05/14/2022 10:18:30 AM By: Geralyn Corwin DO Signed: 05/14/2022 4:54:11 PM By: Shawn Stall RN, BSN Entered By: Elesa Hacker,  Bobbi on 05/14/2022 09:43:04 -------------------------------------------------------------------------------- Debridement Details Patient Name: Date of Service: Samuel [name] 05/14/2022 8:45 Samuel M Medical Record Number: [id number] Patient Account Number: [id number] Date of Birth/Sex: Treating RN: [birth year] (52 y.o. [name] Primary Care Provider: Bertram Denver Other Clinician: Referring Provider: Treating Provider/Extender: Altamese Dilling Weeks in Treatment: 0 Debridement Performed for Assessment: Wound #8 Right,Dorsal Foot Performed By: Physician Geralyn Corwin, DO Debridement Type: Debridement Severity of Tissue Pre Debridement: Fat layer exposed Level of Consciousness (Pre-procedure): Awake and Alert Pre-procedure Verification/Time Out Yes - 09:35 Taken: Start Time: 09:36 Pain Control: Lidocaine 4% T opical Solution T Area Debrided (L x W): otal 0.3 (cm) x 0.9 (cm) = 0.27 (cm) Tissue and other material debrided: Viable, Non-Viable, Slough, Subcutaneous, Slough Level: Skin/Subcutaneous Tissue Samuel [name], Samuel [name] ([id number]) [id number] Page 3 of 11 Debridement Description: Excisional Instrument: Curette Bleeding: Minimum Hemostasis Achieved: Pressure End Time:  09:40 Procedural Pain: 0 Post Procedural Pain: 0 Response to Treatment: Procedure was tolerated well Level of Consciousness (Post- Awake and Alert procedure): Post Debridement Measurements of Total Wound Length: (cm) 0.3 Width: (cm) 0.9 Depth: (cm) 0.2 Volume: (cm) 0.042 Character of Wound/Ulcer Post Debridement: Improved Severity of Tissue Post Debridement: Fat layer exposed Post Procedure Diagnosis Same as Pre-procedure Electronic Signature(s) Signed: 05/14/2022 10:18:30 AM By: Geralyn Corwin DO Signed: 05/14/2022 4:54:11 PM By: Shawn Stall RN, BSN Entered By: Shawn Stall on 05/14/2022 09:44:16 -------------------------------------------------------------------------------- HPI Details Patient Name: Date of Service: Samuel Starch G. 05/14/2022 8:45 Samuel M Medical Record Number: [id number] Patient Account Number: [id number] Date of Birth/Sex: Treating RN: 1970/04/11 (52 y.o. M) Primary Care Provider: Bertram Denver Other Clinician: Referring Provider: Treating Provider/Extender: Altamese Dilling Weeks in Treatment: 0 History of Present Illness HPI Description: 05/14/2022 Mr. Trystan Akhtar is Samuel 52 year old male with Samuel past medical history of venous insufficiency, COPD and OSA that presents to the clinic for Samuel 84-month history of nonhealing ulcers to his legs bilaterally. He has compression stockings but has not been wearing them. He has been keeping the areas covered. He states he has Samuel history of wounds that wax and wane in healing. He currently denies signs of infection. Electronic Signature(s) Signed: 05/14/2022 10:18:30 AM By: Geralyn Corwin DO Entered By: Geralyn Corwin on 05/14/2022 09:50:09 -------------------------------------------------------------------------------- Physical Exam Details Patient Name: Date of Service: Samuel [name] 05/14/2022 8:45 Samuel M Medical Record Number: [id number] Patient Account Number: [id number] Date of  Birth/Sex: Treating RN: 1970-04-30 (52 y.o. M) Primary Care Provider: Bertram Denver Other Clinician: Referring Provider: Treating Provider/Extender: Altamese Dilling Weeks in Treatment: 0 Constitutional respirations regular, non-labored and within target range for patient.. Cardiovascular 2+ dorsalis pedis/posterior tibialis pulses. Psychiatric pleasant and cooperative. Samuel [name], Samuel [name] ([id number]) [id number] Page 4 of 11 Notes Left lower extremity: T the distal medial aspect there is an open wound with nonviable tissue. o Right lower extremity: T the anterior aspect of the leg and dorsal aspect of the foot there is an open wound with granulation tissue and nonviable tissue. o 2+ pitting edema to the knees bilaterally. Significant hemosiderin staining noted throughout the legs. No surrounding signs of infection. Electronic Signature(s) Signed: 05/14/2022 10:18:30 AM By: Geralyn Corwin DO Entered By: Geralyn Corwin on 05/14/2022 09:51:35 -------------------------------------------------------------------------------- Physician Orders Details Patient Name: Date of Service: Samuel [name]. 05/14/2022 8:45 Samuel M Medical Record Number: [id number] Patient Account Number: [id number] Date of Birth/Sex: Treating RN: [birth year] (52 y.o. M) Elesa Hacker, Yvonne Kendall  Primary Care Provider: Bertram Denver Other Clinician: Referring Provider: Treating Provider/Extender: Altamese Dilling Weeks in Treatment: 0 Verbal / Phone Orders: No Diagnosis Coding ICD-10 Coding Code Description 2056789202 Non-pressure chronic ulcer of other part of right lower leg with fat layer exposed L97.822 Non-pressure chronic ulcer of other part of left lower leg with fat layer exposed I87.313 Chronic venous hypertension (idiopathic) with ulcer of bilateral lower extremity J44.89 Other specified chronic obstructive pulmonary disease Follow-up  Appointments ppointment in 1 week. - Dr. Mikey Bussing Monday 05/20/2022 room 8 215pm Return Samuel ppointment in 2 weeks. - Dr. Mikey Bussing Monday 05/27/2022 room 8 3pm Return Samuel Anesthetic (In clinic) Topical Lidocaine 4% applied to wound bed Bathing/ Shower/ Hygiene May shower with protection but do not get wound dressing(s) wet. Protect dressing(s) with water repellant cover (for example, large plastic bag) or Samuel cast cover and may then take shower. Edema Control - Lymphedema / SCD / Other Elevate legs to the level of the heart or above for 30 minutes daily and/or when sitting for 3-4 times Samuel day throughout the day. Samuel void standing for long periods of time. Exercise regularly If compression wraps slide down please call wound center and speak with Samuel nurse. Wound Treatment Wound #7 - Lower Leg Wound Laterality: Right, Medial Cleanser: Soap and Water 1 x Per Week/30 Days Discharge Instructions: May shower and wash wound with dial antibacterial soap and water prior to dressing change. Cleanser: Vashe 5.8 (oz) 1 x Per Week/30 Days Discharge Instructions: Cleanse the wound with Vashe prior to applying Samuel clean dressing using gauze sponges, not tissue or cotton balls. Peri-Wound Care: Sween Lotion (Moisturizing lotion) 1 x Per Week/30 Days Discharge Instructions: Apply moisturizing lotion as directed Prim Dressing: Hydrofera Blue Ready Transfer Foam, 2.5x2.5 (in/in) 1 x Per Week/30 Days ary Discharge Instructions: Apply directly to wound bed as directed Secondary Dressing: ABD Pad, 8x10 1 x Per Week/30 Days Discharge Instructions: Apply over primary dressing as directed. Compression Wrap: Urgo K2 Lite, two layer compression system, regular 1 x Per Week/30 Days Discharge Instructions: Apply Urgo K2 Lite as directed (alternative to 3 layer compression). Wound #8 - Foot Wound Laterality: Dorsal, Right Cleanser: Soap and Water 1 x Per Week/30 Days Discharge Instructions: May shower and wash wound with dial  antibacterial soap and water prior to dressing change. Cleanser: Vashe 5.8 (oz) 1 x Per Week/30 Days Discharge Instructions: Cleanse the wound with Vashe prior to applying Samuel clean dressing using gauze sponges, not tissue or cotton balls. Samuel [name], Samuel [name] ([id number]) [id number] Page 5 of 11 Peri-Wound Care: Sween Lotion (Moisturizing lotion) 1 x Per Week/30 Days Discharge Instructions: Apply moisturizing lotion as directed Prim Dressing: Hydrofera Blue Ready Transfer Foam, 2.5x2.5 (in/in) 1 x Per Week/30 Days ary Discharge Instructions: Apply directly to wound bed as directed Secondary Dressing: ABD Pad, 8x10 1 x Per Week/30 Days Discharge Instructions: Apply over primary dressing as directed. Compression Wrap: Urgo K2 Lite, two layer compression system, regular 1 x Per Week/30 Days Discharge Instructions: Apply Urgo K2 Lite as directed (alternative to 3 layer compression). Wound #9 - Lower Leg Wound Laterality: Left, Medial Cleanser: Soap and Water 1 x Per Week/30 Days Discharge Instructions: May shower and wash wound with dial antibacterial soap and water prior to dressing change. Cleanser: Vashe 5.8 (oz) 1 x Per Week/30 Days Discharge Instructions: Cleanse the wound with Vashe prior to applying Samuel clean dressing using gauze sponges, not tissue or cotton balls. Peri-Wound Care: Sween Lotion (Moisturizing lotion) 1 x  Per Week/30 Days Discharge Instructions: Apply moisturizing lotion as directed Prim Dressing: Hydrofera Blue Ready Transfer Foam, 2.5x2.5 (in/in) 1 x Per Week/30 Days ary Discharge Instructions: Apply directly to wound bed as directed Secondary Dressing: ABD Pad, 8x10 1 x Per Week/30 Days Discharge Instructions: Apply over primary dressing as directed. Compression Wrap: Urgo K2 Lite, two layer compression system, regular 1 x Per Week/30 Days Discharge Instructions: Apply Urgo K2 Lite as directed (alternative to 3 layer compression). Patient  Medications llergies: shellfish derived Samuel Notifications Medication Indication Start End as needed for 05/14/2022 lidocaine debridements in clinic. DOSE topical 4 % gel - gel topical once daily Electronic Signature(s) Signed: 05/14/2022 10:18:30 AM By: Geralyn Corwin DO Entered By: Geralyn Corwin on 05/14/2022 09:51:44 -------------------------------------------------------------------------------- Problem List Details Patient Name: Date of Service: Samuel Starch G. 05/14/2022 8:45 Samuel M Medical Record Number: [id number] Patient Account Number: [id number] Date of Birth/Sex: Treating RN: 20-Nov-1970 (52 y.o. M) Primary Care Provider: Bertram Denver Other Clinician: Referring Provider: Treating Provider/Extender: Altamese Dilling Weeks in Treatment: 0 Active Problems ICD-10 Encounter Code Description Active Date MDM Diagnosis L97.812 Non-pressure chronic ulcer of other part of right lower leg with fat layer 05/14/2022 No Yes exposed L97.822 Non-pressure chronic ulcer of other part of left lower leg with fat layer exposed4/16/2024 No Yes I87.313 Chronic venous hypertension (idiopathic) with ulcer of bilateral lower extremity 05/14/2022 No Yes Samuel [name], Samuel [name] ([id number]) [id number] Page 6 of 11 J44.89 Other specified chronic obstructive pulmonary disease 05/14/2022 No Yes Inactive Problems Resolved Problems Electronic Signature(s) Signed: 05/14/2022 10:18:30 AM By: Geralyn Corwin DO Entered By: Geralyn Corwin on 05/14/2022 09:46:59 -------------------------------------------------------------------------------- Progress Note Details Patient Name: Date of Service: Samuel Starch G. 05/14/2022 8:45 Samuel M Medical Record Number: [id number] Patient Account Number: [id number] Date of Birth/Sex: Treating RN: 1970-07-24 (52 y.o. M) Primary Care Provider: Bertram Denver Other Clinician: Referring Provider: Treating  Provider/Extender: Altamese Dilling Weeks in Treatment: 0 Subjective Chief Complaint Information obtained from Patient patient is here for review of wounds on his right medial lower leg and right lateral foot 06/27/17; patient returns to clinic today with Samuel reopening on his bilateral medial lower calfs History of Present Illness (HPI) 05/14/2022 Mr. Samuel [name] is Samuel 52 year old male with Samuel past medical history of venous insufficiency, COPD and OSA that presents to the clinic for Samuel 62-month history of nonhealing ulcers to his legs bilaterally. He has compression stockings but has not been wearing them. He has been keeping the areas covered. He states he has Samuel history of wounds that wax and wane in healing. He currently denies signs of infection. Patient History Information obtained from Patient. Allergies shellfish derived (Severity: Severe, Reaction: lips swelling) Family History Hypertension - Mother, Lung Disease - Mother, No family history of Cancer, Diabetes, Heart Disease, Hereditary Spherocytosis, Kidney Disease, Seizures, Stroke, Thyroid Problems, Tuberculosis. Social History Former smoker - quit 11 yrs ago, Marital Status - Single, Alcohol Use - Rarely, Drug Use - Current History - TCH, Caffeine Use - Moderate - coffee. Medical History Eyes Denies history of Cataracts, Glaucoma, Optic Neuritis Ear/Nose/Mouth/Throat Denies history of Chronic sinus problems/congestion, Middle ear problems Hematologic/Lymphatic Patient has history of Lymphedema Denies history of Anemia, Hemophilia, Human Immunodeficiency Virus, Sickle Cell Disease Respiratory Patient has history of Asthma, Chronic Obstructive Pulmonary Disease (COPD) Denies history of Aspiration, Pneumothorax, Sleep Apnea, Tuberculosis Cardiovascular Patient has history of Hypertension, Peripheral Venous Disease Denies history of Angina, Arrhythmia, Congestive Heart Failure, Coronary Artery Disease, Deep  Vein  Thrombosis, Hypotension, Myocardial Infarction, Peripheral Arterial Disease, Phlebitis, Vasculitis Gastrointestinal Denies history of Cirrhosis , Colitis, Crohnoos, Hepatitis Samuel, Hepatitis B, Hepatitis C Endocrine Denies history of Type I Diabetes, Type II Diabetes Genitourinary Denies history of End Stage Renal Disease Immunological Denies history of Lupus Erythematosus, Raynaudoos, Scleroderma Integumentary (Skin) Denies history of History of Burn Musculoskeletal Denies history of Gout, Rheumatoid Arthritis, Osteoarthritis, Osteomyelitis Neurologic Samuel [name], Samuel [name] ([id number]) [id number] Page 7 of 11 Denies history of Dementia, Neuropathy, Quadriplegia, Paraplegia, Seizure Disorder Oncologic Denies history of Received Chemotherapy, Received Radiation Psychiatric Patient has history of Confinement Anxiety Denies history of Anorexia/bulimia Medical Samuel Surgical History Notes nd Constitutional Symptoms (General Health) morbid obesity Objective Constitutional respirations regular, non-labored and within target range for patient.. Vitals Time Taken: 9:05 AM, Height: 70 in, Source: Stated, Weight: 370 lbs, Source: Stated, BMI: 53.1, Temperature: 98.2 F, Pulse: 103 bpm, Respiratory Rate: 20 breaths/min, Blood Pressure: 168/101 mmHg. Cardiovascular 2+ dorsalis pedis/posterior tibialis pulses. Psychiatric pleasant and cooperative. General Notes: Left lower extremity: T the distal medial aspect there is an open wound with nonviable tissue. Right lower extremity: T the anterior aspect of o o the leg and dorsal aspect of the foot there is an open wound with granulation tissue and nonviable tissue. 2+ pitting edema to the knees bilaterally. Significant hemosiderin staining noted throughout the legs. No surrounding signs of infection. Integumentary (Hair, Skin) Wound #7 status is Open. Original cause of wound was Gradually Appeared. The date acquired was:  11/12/2021. The wound is located on the Right,Medial Lower Leg. The wound measures 2.9cm length x 1.9cm width x 0.3cm depth; 4.328cm^2 area and 1.298cm^3 volume. There is no tunneling or undermining noted. There is Samuel medium amount of serosanguineous drainage noted. The wound margin is distinct with the outline attached to the wound base. There is large (67-100%) red granulation within the wound bed. There is Samuel small (1-33%) amount of necrotic tissue within the wound bed including Adherent Slough. The periwound skin appearance exhibited: Scarring. The periwound skin appearance did not exhibit: Callus, Crepitus, Excoriation, Induration, Rash, Dry/Scaly, Maceration, Atrophie Blanche, Cyanosis, Ecchymosis, Hemosiderin Staining, Mottled, Pallor, Rubor, Erythema. Wound #8 status is Open. Original cause of wound was Gradually Appeared. The date acquired was: 03/15/2022. The wound is located on the Right,Dorsal Foot. The wound measures 0.3cm length x 0.9cm width x 0.2cm depth; 0.212cm^2 area and 0.042cm^3 volume. There is Fat Layer (Subcutaneous Tissue) exposed. There is no tunneling or undermining noted. There is Samuel medium amount of serosanguineous drainage noted. The wound margin is distinct with the outline attached to the wound base. There is large (67-100%) pink granulation within the wound bed. There is Samuel small (1-33%) amount of necrotic tissue within the wound bed including Adherent Slough. The periwound skin appearance exhibited: Scarring. The periwound skin appearance did not exhibit: Callus, Crepitus, Excoriation, Induration, Rash, Dry/Scaly, Maceration, Atrophie Blanche, Cyanosis, Ecchymosis, Hemosiderin Staining, Mottled, Pallor, Rubor, Erythema. Wound #9 status is Open. Original cause of wound was Gradually Appeared. The date acquired was: 04/13/2022. The wound is located on the Left,Medial Lower Leg. The wound measures 2.5cm length x 0.3cm width x 0.1cm depth; 0.589cm^2 area and 0.059cm^3 volume.  There is Fat Layer (Subcutaneous Tissue) exposed. There is no tunneling or undermining noted. There is Samuel medium amount of serosanguineous drainage noted. The wound margin is distinct with the outline attached to the wound base. There is no granulation within the wound bed. There is Samuel large (67-100%) amount of necrotic tissue within the wound bed  including Eschar. The periwound skin appearance exhibited: Scarring. The periwound skin appearance did not exhibit: Callus, Crepitus, Excoriation, Induration, Rash, Dry/Scaly, Maceration, Atrophie Blanche, Cyanosis, Ecchymosis, Hemosiderin Staining, Mottled, Pallor, Rubor, Erythema. 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 He had strong pedal pulses and should have adequate blood flow for wound healing. He has uncontrolled swelling and this likely skewed his results for his ABIs which were 0.77 on the left and 0.83 on the right. At this time I debrided nonviable tissue. I recommended Hydrofera Blue to the wound sites under 3 layer compression. He knows to not get the wraps wet or keep these on for more than 7 days. He knows to call with any questions or concerns. Procedures Samuel [name], Samuel [name] ([id number]) [id number] Page 8 of 11 Wound #7 Pre-procedure diagnosis of Wound #7 is Samuel Venous Leg Ulcer located on the Right,Medial Lower Leg .Severity of Tissue Pre Debridement is: Fat layer exposed. There was Samuel Excisional Skin/Subcutaneous Tissue Debridement with Samuel total area of 5.51 sq cm performed by Geralyn Corwin, DO. With the following instrument(s): Curette to remove Viable and Non-Viable tissue/material. Material removed includes Subcutaneous Tissue and Slough and after achieving pain control using Lidocaine 4% T opical Solution. Samuel time out was conducted at 09:35, prior to the start of the procedure. Samuel Minimum amount of bleeding was controlled with Pressure. The procedure was tolerated well with Samuel pain level of 0 throughout and Samuel pain level of 0 following the procedure. Post Debridement Measurements: 2.9cm length x 1.9cm width x 0.3cm depth; 1.298cm^3 volume. Character of Wound/Ulcer Post Debridement is improved. Severity of Tissue Post Debridement is: Fat layer exposed. Post procedure Diagnosis Wound #7: Same as Pre-Procedure Pre-procedure diagnosis of Wound #7 is Samuel Venous Leg Ulcer located on the Right,Medial Lower Leg . There was Samuel Three Layer Compression Therapy Procedure by Shawn Stall, RN. 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 There was Samuel Excisional Skin/Subcutaneous Tissue Debridement with Samuel total area of 0.27 sq cm performed by Geralyn Corwin, DO. With the following instrument(s): Curette to remove Viable and Non-Viable tissue/material. Material removed includes Subcutaneous Tissue and Slough and after achieving pain control using Lidocaine 4% T opical Solution. Samuel time out was conducted at 09:35, prior to the start of the procedure. Samuel Minimum amount of bleeding was controlled with Pressure. The procedure was tolerated well with Samuel pain level of 0 throughout and Samuel pain level of 0 following the procedure. Post Debridement Measurements: 0.3cm length x 0.9cm width x 0.2cm depth; 0.042cm^3 volume. Character of Wound/Ulcer Post Debridement is improved. Severity of Tissue Post Debridement is: Fat layer exposed. 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 There was Samuel Three Layer Compression Therapy Procedure by Shawn Stall, RN. Post procedure Diagnosis Wound #8: Same as Pre-Procedure Wound #9 Pre-procedure diagnosis of Wound #9 is Samuel Venous Leg Ulcer located on the Left,Medial Lower Leg .Severity of Tissue  Pre Debridement is: Limited to breakdown of skin. There was Samuel Selective/Open Wound Skin/Epidermis Debridement with Samuel total area of 0.75 sq cm performed by Geralyn Corwin, DO. With the following instrument(s): Curette to remove Viable and Non-Viable tissue/material. Material removed includes Eschar and Skin: Epidermis and after achieving pain control using Lidocaine 4% T opical Solution. Samuel time out was conducted at 09:35, prior to the start of the procedure. Samuel Minimum amount of bleeding was controlled with Pressure. The procedure was tolerated well with Samuel pain level of 0 throughout and Samuel pain level of 0 following the procedure. Post Debridement Measurements: 2.5cm length x 0.3cm width x 0.1cm depth; 0.059cm^3 volume. Character of Wound/Ulcer Post Debridement is improved. Severity of Tissue Post Debridement is: Limited to breakdown of skin. Post procedure Diagnosis Wound #9: Same as Pre-Procedure Pre-procedure diagnosis of Wound #9 is Samuel Venous Leg Ulcer located on the Left,Medial Lower Leg . There was Samuel Three Layer Compression Therapy Procedure by Shawn Stall, RN. Post procedure Diagnosis Wound #9: Same as Pre-Procedure Plan Follow-up Appointments: Return Appointment in 1 week. - Dr. Mikey Bussing Monday 05/20/2022 room 8 215pm Return Appointment in 2 weeks. - Dr. Mikey Bussing Monday 05/27/2022 room 8 3pm Anesthetic: (In clinic) Topical Lidocaine 4% applied to wound bed Bathing/ Shower/ Hygiene: May shower with protection but do not get wound dressing(s) wet. Protect dressing(s) with water repellant cover (for  example, large plastic bag) or Samuel cast cover and may then take shower. Edema Control - Lymphedema / SCD / Other: Elevate legs to the level of the heart or above for 30 minutes daily and/or when sitting for 3-4 times Samuel day throughout the day. Avoid standing for long periods of time. Exercise regularly If compression wraps slide down please call wound center and speak with Samuel nurse. The following medication(s) was prescribed: lidocaine topical 4 % gel gel topical once daily for as needed for debridements in clinic. was prescribed at facility WOUND #7: - Lower Leg Wound Laterality: Right, Medial Cleanser: Soap and Water 1 x Per Week/30 Days Discharge Instructions: May shower and wash wound with dial antibacterial soap and water prior to dressing change. Cleanser: Vashe 5.8 (oz) 1 x Per Week/30 Days Discharge Instructions: Cleanse the wound with Vashe prior to applying Samuel clean dressing using gauze sponges, not tissue or cotton balls. Peri-Wound Care: Sween Lotion (Moisturizing lotion) 1 x Per Week/30 Days Discharge Instructions: Apply moisturizing lotion as directed Prim Dressing: Hydrofera Blue Ready Transfer Foam, 2.5x2.5 (in/in) 1 x Per Week/30 Days ary Discharge Instructions: Apply directly to wound bed as directed Secondary Dressing: ABD Pad, 8x10 1 x Per Week/30 Days Discharge Instructions: Apply over primary dressing as directed. Com pression Wrap: Urgo K2 Lite, two layer compression system, regular 1 x Per Week/30 Days Discharge Instructions: Apply Urgo K2 Lite as directed (alternative to 3 layer compression). WOUND #8: - Foot Wound Laterality: Dorsal, Right Cleanser: Soap and Water 1 x Per Week/30 Days Discharge Instructions: May shower and wash wound with dial antibacterial soap and water prior to dressing change. Cleanser: Vashe 5.8 (oz) 1 x Per Week/30 Days Discharge Instructions: Cleanse the wound with Vashe prior to applying Samuel clean dressing using gauze sponges, not tissue or cotton  balls. Peri-Wound Care: Sween Lotion (Moisturizing lotion) 1 x Per Week/30 Days Discharge Instructions: Apply moisturizing lotion as directed Prim Dressing: Hydrofera Blue Ready Transfer Foam, 2.5x2.5 (in/in) 1 x Per Week/30 Days Samuel [name], Samuel [name] ([id number]) [id number] Page 9 of 11 Discharge Instructions: Apply directly to wound bed  as directed Secondary Dressing: ABD Pad, 8x10 1 x Per Week/30 Days Discharge Instructions: Apply over primary dressing as directed. Com pression Wrap: Urgo K2 Lite, two layer compression system, regular 1 x Per Week/30 Days Discharge Instructions: Apply Urgo K2 Lite as directed (alternative to 3 layer compression). WOUND #9: - Lower Leg Wound Laterality: Left, Medial Cleanser: Soap and Water 1 x Per Week/30 Days Discharge Instructions: May shower and wash wound with dial antibacterial soap and water prior to dressing change. Cleanser: Vashe 5.8 (oz) 1 x Per Week/30 Days Discharge Instructions: Cleanse the wound with Vashe prior to applying Samuel clean dressing using gauze sponges, not tissue or cotton balls. Peri-Wound Care: Sween Lotion (Moisturizing lotion) 1 x Per Week/30 Days Discharge Instructions: Apply moisturizing lotion as directed Prim Dressing: Hydrofera Blue Ready Transfer Foam, 2.5x2.5 (in/in) 1 x Per Week/30 Days ary Discharge Instructions: Apply directly to wound bed as directed Secondary Dressing: ABD Pad, 8x10 1 x Per Week/30 Days Discharge Instructions: Apply over primary dressing as directed. Com pression Wrap: Urgo K2 Lite, two layer compression system, regular 1 x Per Week/30 Days Discharge Instructions: Apply Urgo K2 Lite as directed (alternative to 3 layer compression). 1. In office sharp debridement 2. Hydrofera Blue 3. 3 layer compression to lower extremities bilaterally 4. Follow-up in 1 week Electronic Signature(s) Signed: 05/14/2022 10:18:30 AM By: Geralyn Corwin DO Entered By: Geralyn Corwin on  05/14/2022 09:54:02 -------------------------------------------------------------------------------- HxROS Details Patient Name: Date of Service: Samuel Starch G. 05/14/2022 8:45 Samuel M Medical Record Number: [id number] Patient Account Number: [id number] Date of Birth/Sex: Treating RN: January 21, 1971 (52 y.o. Charlean Merl, Lauren Primary Care Provider: Bertram Denver Other Clinician: Referring Provider: Treating Provider/Extender: Altamese Dilling Weeks in Treatment: 0 Information Obtained From Patient Constitutional Symptoms (General Health) Medical History: Past Medical History Notes: morbid obesity Eyes Medical History: Negative for: Cataracts; Glaucoma; Optic Neuritis Ear/Nose/Mouth/Throat Medical History: Negative for: Chronic sinus problems/congestion; Middle ear problems Hematologic/Lymphatic Medical History: Positive for: Lymphedema Negative for: Anemia; Hemophilia; Human Immunodeficiency Virus; Sickle Cell Disease Respiratory Medical History: Positive for: Asthma; Chronic Obstructive Pulmonary Disease (COPD) Negative for: Aspiration; Pneumothorax; Sleep Apnea; Tuberculosis Cardiovascular Medical History: Positive for: Hypertension; Peripheral Venous Disease Negative for: Angina; Arrhythmia; Congestive Heart Failure; Coronary Artery Disease; Deep Vein Thrombosis; Hypotension; Myocardial Infarction; Peripheral Arterial Disease; Phlebitis; Vasculitis Samuel [name], Samuel [name] ([id number]) [id number] Page 10 of 11 Gastrointestinal Medical History: Negative for: Cirrhosis ; Colitis; Crohns; Hepatitis Samuel; Hepatitis B; Hepatitis C Endocrine Medical History: Negative for: Type I Diabetes; Type II Diabetes Genitourinary Medical History: Negative for: End Stage Renal Disease Immunological Medical History: Negative for: Lupus Erythematosus; Raynauds; Scleroderma Integumentary (Skin) Medical History: Negative for: History of  Burn Musculoskeletal Medical History: Negative for: Gout; Rheumatoid Arthritis; Osteoarthritis; Osteomyelitis Neurologic Medical History: Negative for: Dementia; Neuropathy; Quadriplegia; Paraplegia; Seizure Disorder Oncologic Medical History: Negative for: Received Chemotherapy; Received Radiation Psychiatric Medical History: Positive for: Confinement Anxiety Negative for: Anorexia/bulimia Immunizations Pneumococcal Vaccine: Received Pneumococcal Vaccination: Yes Received Pneumococcal Vaccination On or After 60th Birthday: Yes Tetanus Vaccine: Last tetanus shot: 02/08/2014 Implantable Devices None Family and Social History Cancer: No; Diabetes: No; Heart Disease: No; Hereditary Spherocytosis: No; Hypertension: Yes - Mother; Kidney Disease: No; Lung Disease: Yes - Mother; Seizures: No; Stroke: No; Thyroid Problems: No; Tuberculosis: No; Former smoker - quit 11 yrs ago; Marital Status - Single; Alcohol Use: Rarely; Drug Use: Current History - TCH; Caffeine Use: Moderate - coffee; Financial Concerns: No; Food, Clothing or Shelter Needs: No; Support System Lacking: No; Transportation  Concerns: No Electronic Signature(s) Signed: 05/14/2022 10:18:30 AM By: Geralyn Corwin DO Signed: 05/14/2022 11:10:16 AM By: Fonnie Mu RN Signed: 05/14/2022 4:54:11 PM By: Shawn Stall RN, BSN Entered By: Shawn Stall on 05/14/2022 09:07:26 -------------------------------------------------------------------------------- SuperBill Details Patient Name: Date of Service: Samuel [name] 05/14/2022 Medical Record Number: [id number] Patient Account Number: [id number] Date of Birth/Sex: Treating RN: March 07, 1970 (52 y.o. Hillery, Bhalla ([id number]) [id number] Page 11 of 11 Primary Care Provider: Bertram Denver Other Clinician: Referring Provider: Treating Provider/Extender: Altamese Dilling Weeks in Treatment: 0 Diagnosis  Coding ICD-10 Codes Code Description [phone] Non-pressure chronic ulcer of other part of right lower leg with fat layer exposed L97.822 Non-pressure chronic ulcer of other part of left lower leg with fat layer exposed I87.313 Chronic venous hypertension (idiopathic) with ulcer of bilateral lower extremity J44.89 Other specified chronic obstructive pulmonary disease Facility Procedures : CPT4 Code: [id number] Description: 99213 - WOUND CARE VISIT-LEV 3 EST PT Modifier: Quantity: 1 : CPT4 Code: [id number] Description: 11042 - DEB SUBQ TISSUE 20 SQ CM/< ICD-10 Diagnosis Description L97.812 Non-pressure chronic ulcer of other part of right lower leg with fat layer expos Modifier: ed Quantity: 1 : CPT4 Code: [id number] Description: 97597 - DEBRIDE WOUND 1ST 20 SQ CM OR < ICD-10 Diagnosis Description L97.822 Non-pressure chronic ulcer of other part of left lower leg with fat layer expose Modifier: d Quantity: 1 Physician Procedures : CPT4 Code Description Modifier [id number] 99204 - WC PHYS LEVEL 4 - NEW PT ICD-10 Diagnosis Description L97.812 Non-pressure chronic ulcer of other part of right lower leg with fat layer exposed L97.822 Non-pressure chronic ulcer of other part of left  lower leg with fat layer exposed I87.313 Chronic venous hypertension (idiopathic) with ulcer of bilateral lower extremity J44.89 Other specified chronic obstructive pulmonary disease Quantity: 1 : [id number] 11042 - WC PHYS SUBQ TISS 20 SQ CM ICD-10 Diagnosis Description L97.812 Non-pressure chronic ulcer of other part of right lower leg with fat layer exposed Quantity: 1 : [id number] 97597 - WC PHYS DEBR WO ANESTH 20 SQ CM ICD-10 Diagnosis Description L97.822 Non-pressure chronic ulcer of other part of left lower leg with fat layer exposed Quantity: 1 Electronic Signature(s) Signed: 05/14/2022 10:18:30 AM By: Geralyn Corwin DO Entered By: Geralyn Corwin on 05/14/2022 09:54:36

## 2022-05-15 NOTE — Progress Notes (Signed)
Samuel [name], Samuel [name] ([id number]) [id number] Page 1 of 14 Visit Report for 05/14/2022 Allergy List Details Patient Name: Date of Service: Samuel [name] 05/14/2022 8:45 Samuel [name] Medical Record Number: [id number] Patient Account Number: [id number] Date of Birth/Sex: Treating RN: 14-Dec-1970 (52 y.o. Samuel [name], Lauren Primary Care Viki Carrera: Bertram [place] Other Clinician: Referring Manning Luna: Treating Dhrithi Riche/Extender: Crist Fat, Shon Hale Weeks in Treatment: 0 Allergies Active Allergies shellfish derived Reaction: lips swelling Severity: Severe Allergy Notes Electronic Signature(s) Signed: 05/14/2022 11:10:16 AM By: Fonnie Mu RN Entered By: Fonnie Mu on 05/13/2022 15:04:24 -------------------------------------------------------------------------------- Arrival Information Details Patient Name: Date of Service: Samuel Starch G. 05/14/2022 8:45 Samuel [name] Medical Record Number: [id number] Patient Account Number: [id number] Date of Birth/Sex: Treating RN: 14-Mar-1970 (52 y.o. Samuel [name], Yvonne Kendall Primary Care Sofya Moustafa: Bertram [place] Other Clinician: Referring Manju Kulkarni: Treating Genice Kimberlin/Extender: Altamese Dilling Weeks in Treatment: 0 Visit Information Patient Arrived: Ambulatory Arrival Time: 09:03 Accompanied By: self Transfer Assistance: None Patient Identification Verified: Yes Secondary Verification Process Completed: Yes Patient Requires Transmission-Based Precautions: No History Since Last Visit Added or deleted any medications: No Any new allergies or adverse reactions: No Had Samuel fall or experienced change in activities of daily living that may affect risk of falls: No Signs or symptoms of abuse/neglect since last visito No Hospitalized since last visit: No Implantable device outside of the clinic excluding cellular tissue based products placed in the center since last visit: No Electronic  Signature(s) Signed: 05/14/2022 4:54:11 PM By: Shawn Stall RN, BSN Entered By: Shawn Stall on 05/14/2022 09:24:28 -------------------------------------------------------------------------------- Clinic Level of Care Assessment Details Patient Name: Date of Service: Samuel Starch G. 05/14/2022 8:45 Samuel [name] Medical Record Number: [id number] Patient Account Number: [id number] Date of Birth/Sex: Treating RN: Jul 04, 1970 (52 y.o. Samuel [name] Primary Care Anel Purohit: Bertram Denver Other Clinician: Referring Mearl Olver: Treating Tykeisha Peer/Extender: Altamese Dilling Weeks in Treatment: 0 Clinic Level of Care Assessment Items TOOL 1 Quantity Score X- 1 0 Use when EandM and Procedure is performed on INITIAL visit KAMERYN, TISDEL ([id number]) [id number] Page 2 of 14 ASSESSMENTS - Nursing Assessment / Reassessment X- 1 20 General Physical Exam (combine w/ comprehensive assessment (listed just below) when performed on new pt. evals) X- 1 25 Comprehensive Assessment (HX, ROS, Risk Assessments, Wounds Hx, etc.) ASSESSMENTS - Wound and Skin Assessment / Reassessment X- 1 10 Dermatologic / Skin Assessment (not related to wound area) ASSESSMENTS - Ostomy and/or Continence Assessment and Care []  - 0 Incontinence Assessment and Management []  - 0 Ostomy Care Assessment and Management (repouching, etc.) PROCESS - Coordination of Care []  - 0 Simple Patient / Family Education for ongoing care X- 1 20 Complex (extensive) Patient / Family Education for ongoing care X- 1 10 Staff obtains Chiropractor, Records, T Results / Process Orders est []  - 0 Staff telephones HHA, Nursing Homes / Clarify orders / etc []  - 0 Routine Transfer to another Facility (non-emergent condition) []  - 0 Routine Hospital Admission (non-emergent condition) X- 1 15 New Admissions / Manufacturing engineer / Ordering NPWT Apligraf, etc. , []  - 0 Emergency Hospital Admission  (emergent condition) PROCESS - Special Needs []  - 0 Pediatric / Minor Patient Management []  - 0 Isolation Patient Management []  - 0 Hearing / Language / Visual special needs []  - 0 Assessment of Community assistance (transportation, D/C planning, etc.) []  - 0 Additional assistance / Altered mentation []  - 0 Support Surface(s) Assessment (bed, cushion, seat, etc.) INTERVENTIONS - Miscellaneous []  - 0 External  ear exam  - 0 Patient Transfer (multiple staff / Nurse, adult / Similar devices)  - 0 Simple Staple / Suture removal (25 or less)  - 0 Complex Staple / Suture removal (26 or more)  - 0 Hypo/Hyperglycemic Management (do not check if billed separately) X- 1 15 Ankle / Brachial Index (ABI) - do not check if billed separately Has the patient been seen at the hospital within the last three years: Yes Total Score: 115 Level Of Care: New/Established - Level 3 Electronic Signature(s) Signed: 05/14/2022 4:54:11 PM By: Shawn Stall RN, BSN Entered By: Shawn Stall on 05/14/2022 09:45:55 -------------------------------------------------------------------------------- Compression Therapy Details Patient Name: Date of Service: Samuel Starch G. 05/14/2022 8:45 Samuel [name] Medical Record Number: [id number] Patient Account Number: [id number] Date of Birth/Sex: Treating RN: 08/14/1970 (52 y.o. Samuel [name] Primary Care Worthington Cruzan: Bertram Denver Other Clinician: Referring Slayton Lubitz: Treating Olla Delancey/Extender: Altamese Dilling Weeks in Treatment: 0 Compression Therapy Performed for Wound Assessment: Wound #7 Right,Medial Lower Leg Performed By: Clinician Shawn Stall, RN Compression Type: Three Layer Post Procedure Diagnosis Samuel [name] ([id number]) [id number] Page 3 of 14 Same as Pre-procedure Electronic Signature(s) Signed: 05/14/2022 4:54:11 PM By: Shawn Stall RN, BSN Entered By: Shawn Stall on 05/14/2022  09:43:14 -------------------------------------------------------------------------------- Compression Therapy Details Patient Name: Date of Service: Samuel [name]. 05/14/2022 8:45 Samuel [name] Medical Record Number: [id number] Patient Account Number: [id number] Date of Birth/Sex: Treating RN: [birth year] (52 y.o. Samuel [name] Primary Care Myson Levi: Bertram Denver Other Clinician: Referring Leroy Pettway: Treating Ishan Sanroman/Extender: Altamese Dilling Weeks in Treatment: 0 Compression Therapy Performed for Wound Assessment: Wound #9 Left,Medial Lower Leg Performed By: Clinician Shawn Stall, RN Compression Type: Three Layer Post Procedure Diagnosis Same as Pre-procedure Electronic Signature(s) Signed: 05/14/2022 4:54:11 PM By: Shawn Stall RN, BSN Entered By: Shawn Stall on 05/14/2022 09:43:14 -------------------------------------------------------------------------------- Compression Therapy Details Patient Name: Date of Service: Samuel [name]. 05/14/2022 8:45 Samuel [name] Medical Record Number: [id number] Patient Account Number: [id number] Date of Birth/Sex: Treating RN: [birth year] (52 y.o. Samuel [name] Primary Care Annaliz Aven: Bertram Denver Other Clinician: Referring Fallyn Munnerlyn: Treating Kinleigh Nault/Extender: Altamese Dilling Weeks in Treatment: 0 Compression Therapy Performed for Wound Assessment: Wound #8 Right,Dorsal Foot Performed By: Clinician Shawn Stall, RN Compression Type: Three Layer Post Procedure Diagnosis Same as Pre-procedure Electronic Signature(s) Signed: 05/14/2022 4:54:11 PM By: Shawn Stall RN, BSN Entered By: Shawn Stall on 05/14/2022 09:43:14 -------------------------------------------------------------------------------- Encounter Discharge Information Details Patient Name: Date of Service: Samuel [name] 05/14/2022 8:45 Samuel [name] Medical Record Number: [id number] Patient Account Number: [id number] Date of Birth/Sex:  Treating RN: [birth year] (52 y.o. Samuel [name] Primary Care Chibueze Beasley: Bertram Denver Other Clinician: Referring Sanoe Hazan: Treating Talissa Apple/Extender: Altamese Dilling Weeks in Treatment: 0 Encounter Discharge Information Items Post Procedure Vitals Discharge Condition: Stable Temperature (F): 98.2 Ambulatory Status: Ambulatory Pulse (bpm): 103 Discharge Destination: Home Respiratory Rate (breaths/min): 20 Transportation: Private Auto Blood Pressure (mmHg): 168/101 Accompanied By: self Schedule Follow-up Appointment: Yes Clinical Summary of Care: CASTIN, DONAGHUE ([id number]) [id number] Page 4 of 14 Electronic Signature(s) Signed: 05/14/2022 4:54:11 PM By: Shawn Stall RN, BSN Entered By: Shawn Stall on 05/14/2022 09:46:40 -------------------------------------------------------------------------------- Lower Extremity Assessment Details Patient Name: Date of Service: Samuel [name] 05/14/2022 8:45 Samuel [name] Medical Record Number: [id number] Patient Account Number: [id number] Date of Birth/Sex: Treating RN: September 05, 1970 (52 y.o. Samuel [name] Primary Care Anniya Whiters: Bertram Denver Other Clinician: Referring Jelitza Manninen: Treating Arya Luttrull/Extender:  Crist Fat, Zelda Weeks in Treatment: 0 Edema Assessment Assessed: [Left: No] [Right: No] [Left: Edema] [Right: :] Calf Left: Right: Point of Measurement: 31 cm From Medial Instep 46 cm 53 cm Ankle Left: Right: Point of Measurement: 13 cm From Medial Instep 29 cm 28.5 cm Knee To Floor Left: Right: From Medial Instep 44 cm Vascular Assessment Pulses: Dorsalis Pedis Palpable: [Left:Yes] [Right:Yes] Doppler Audible: [Left:Yes] [Right:Yes] Posterior Tibial Palpable: [Left:Yes] [Right:Yes] Doppler Audible: [Left:Inaudible] [Right:Inaudible] Blood Pressure: Brachial: [Left:168] [Right:168] Ankle: [Left:Dorsalis Pedis: 130 0.77] [Right:Dorsalis Pedis: 140  0.83] Electronic Signature(s) Signed: 05/14/2022 4:54:11 PM By: Shawn Stall RN, BSN Entered By: Shawn Stall on 05/14/2022 09:25:29 -------------------------------------------------------------------------------- Multi Wound Chart Details Patient Name: Date of Service: Samuel Starch G. 05/14/2022 8:45 Samuel [name] Medical Record Number: [id number] Patient Account Number: [id number] Date of Birth/Sex: Treating RN: 11/30/1970 (52 y.o. [name]) Primary Care Hildred Pharo: Bertram Denver Other Clinician: Referring Aceyn Kathol: Treating Raechal Raben/Extender: Altamese Dilling Weeks in Treatment: 0 Vital Signs Height(in): 70 Pulse(bpm): 103 Weight(lbs): 370 Blood Pressure(mmHg): 168/101 Body Mass Index(BMI): 53.1 Temperature(F): 98.2 Respiratory Rate(breaths/min): 20 GIOVANIE, LEFEBRE G ([id number]):Photos:] [id number].pdf Page 5 of 14:7 8 9] Right, Medial Lower Leg Right, Dorsal Foot Left, Medial Lower Leg Wound Location: Gradually Appeared Gradually Appeared Gradually Appeared Wounding Event: Venous Leg Ulcer Venous Leg Ulcer Venous Leg Ulcer Primary Etiology: Lymphedema, Asthma, Chronic Lymphedema, Asthma, Chronic Lymphedema, Asthma, Chronic Comorbid History: Obstructive Pulmonary Disease Obstructive Pulmonary Disease Obstructive Pulmonary Disease (COPD), Hypertension, Peripheral (COPD), Hypertension, Peripheral (COPD), Hypertension, Peripheral Venous Disease, Confinement Anxiety Venous Disease, Confinement Anxiety Venous Disease, Confinement Anxiety 11/12/2021 03/15/2022 04/13/2022 Date Acquired: 0 0 0 Weeks of Treatment: Open Open Open Wound Status: No No No Wound Recurrence: 2.9x1.9x0.3 0.3x0.9x0.2 2.5x0.3x0.1 Measurements L x W x D (cm) 4.328 0.212 0.589 Samuel (cm) : rea 1.298 0.042 0.059 Volume (cm) : Full Thickness Without Exposed Full Thickness Without Exposed Full Thickness Without Exposed Classification: Support Structures Support Structures  Support Structures Medium Medium Medium Exudate Samuel mount: Serosanguineous Serosanguineous Serosanguineous Exudate Type: red, brown red, brown red, brown Exudate Color: Distinct, outline attached Distinct, outline attached Distinct, outline attached Wound Margin: Large (67-100%) Large (67-100%) None Present (0%) Granulation Samuel mount: Red Pink N/Samuel Granulation Quality: Small (1-33%) Small (1-33%) Large (67-100%) Necrotic Samuel mount: Adherent Slough Adherent Slough Eschar Necrotic Tissue: Fascia: No Fat Layer (Subcutaneous Tissue): Yes Fat Layer (Subcutaneous Tissue): Yes Exposed Structures: Fat Layer (Subcutaneous Tissue): No Fascia: No Fascia: No Tendon: No Tendon: No Tendon: No Muscle: No Muscle: No Muscle: No Joint: No Joint: No Joint: No Bone: No Bone: No Bone: No None None None Epithelialization: Debridement - Excisional Debridement - Excisional Debridement - Selective/Open Wound Debridement: Pre-procedure Verification/Time Out 09:35 09:35 09:35 Taken: Lidocaine 4% Topical Solution Lidocaine 4% Topical Solution Lidocaine 4% Topical Solution Pain Control: Subcutaneous, Slough Subcutaneous, Slough Necrotic/Eschar Tissue Debrided: Skin/Subcutaneous Tissue Skin/Subcutaneous Tissue Skin/Epidermis Level: 5.51 0.27 0.75 Debridement Samuel (sq cm): rea Curette Curette Curette Instrument: Minimum Minimum Minimum Bleeding: Pressure Pressure Pressure Hemostasis Samuel chieved: 0 0 0 Procedural Pain: 0 0 0 Post Procedural Pain: Procedure was tolerated well Procedure was tolerated well Procedure was tolerated well Debridement Treatment Response: 2.9x1.9x0.3 0.3x0.9x0.2 2.5x0.3x0.1 Post Debridement Measurements L x W x D (cm) 1.298 0.042 0.059 Post Debridement Volume: (cm) Scarring: Yes Scarring: Yes Scarring: Yes Periwound Skin Texture: Excoriation: No Excoriation: No Excoriation: No Induration: No Induration: No Induration: No Callus: No Callus: No Callus:  No Crepitus: No Crepitus: No Crepitus: No Rash: No Rash: No Rash:  No Maceration: No Maceration: No Maceration: No Periwound Skin Moisture: Dry/Scaly: No Dry/Scaly: No Dry/Scaly: No Atrophie Blanche: No Atrophie Blanche: No Atrophie Blanche: No Periwound Skin Color: Cyanosis: No Cyanosis: No Cyanosis: No Ecchymosis: No Ecchymosis: No Ecchymosis: No Erythema: No Erythema: No Erythema: No Hemosiderin Staining: No Hemosiderin Staining: No Hemosiderin Staining: No Mottled: No Mottled: No Mottled: No Pallor: No Pallor: No Pallor: No Rubor: No Rubor: No Rubor: No Compression Therapy Compression Therapy Compression Therapy Procedures Performed: Debridement Debridement Debridement Treatment Notes Wound #7 (Lower Leg) Wound Laterality: Right, Medial Cleanser Soap and Water Discharge Instruction: May shower and wash wound with dial antibacterial soap and water prior to dressing change. JI, FELDNER ([id number]) [id number] Page 6 of 14 Vashe 5.8 (oz) Discharge Instruction: Cleanse the wound with Vashe prior to applying Samuel clean dressing using gauze sponges, not tissue or cotton balls. Peri-Wound Care Sween Lotion (Moisturizing lotion) Discharge Instruction: Apply moisturizing lotion as directed Topical Primary Dressing Hydrofera Blue Ready Transfer Foam, 2.5x2.5 (in/in) Discharge Instruction: Apply directly to wound bed as directed Secondary Dressing ABD Pad, 8x10 Discharge Instruction: Apply over primary dressing as directed. Secured With Compression Wrap Urgo K2 Lite, two layer compression system, regular Discharge Instruction: Apply Urgo K2 Lite as directed (alternative to 3 layer compression). Compression Stockings Add-Ons Wound #8 (Foot) Wound Laterality: Dorsal, Right Cleanser Soap and Water Discharge Instruction: May shower and wash wound with dial antibacterial soap and water prior to dressing change. Vashe 5.8  (oz) Discharge Instruction: Cleanse the wound with Vashe prior to applying Samuel clean dressing using gauze sponges, not tissue or cotton balls. Peri-Wound Care Sween Lotion (Moisturizing lotion) Discharge Instruction: Apply moisturizing lotion as directed Topical Primary Dressing Hydrofera Blue Ready Transfer Foam, 2.5x2.5 (in/in) Discharge Instruction: Apply directly to wound bed as directed Secondary Dressing ABD Pad, 8x10 Discharge Instruction: Apply over primary dressing as directed. Secured With Compression Wrap Urgo K2 Lite, two layer compression system, regular Discharge Instruction: Apply Urgo K2 Lite as directed (alternative to 3 layer compression). Compression Stockings Add-Ons Wound #9 (Lower Leg) Wound Laterality: Left, Medial Cleanser Soap and Water Discharge Instruction: May shower and wash wound with dial antibacterial soap and water prior to dressing change. Vashe 5.8 (oz) Discharge Instruction: Cleanse the wound with Vashe prior to applying Samuel clean dressing using gauze sponges, not tissue or cotton balls. Peri-Wound Care Sween Lotion (Moisturizing lotion) Discharge Instruction: Apply moisturizing lotion as directed Topical Primary Dressing [address] Ready Transfer Foam, 2.5x2.5 (in/in) MARVYN, TORREZ ([id number]) [id number] Page 7 of 14 Discharge Instruction: Apply directly to wound bed as directed Secondary Dressing ABD Pad, 8x10 Discharge Instruction: Apply over primary dressing as directed. Secured With Compression Wrap Urgo K2 Lite, two layer compression system, regular Discharge Instruction: Apply Urgo K2 Lite as directed (alternative to 3 layer compression). Compression Stockings Add-Ons Electronic Signature(s) Signed: 05/14/2022 10:18:30 AM By: Geralyn Corwin DO Entered By: Geralyn Corwin on 05/14/2022 09:47:10 -------------------------------------------------------------------------------- Multi-Disciplinary  Care Plan Details Patient Name: Date of Service: Samuel [name]. 05/14/2022 8:45 Samuel [name] Medical Record Number: [id number] Patient Account Number: [id number] Date of Birth/Sex: Treating RN: 10-05-1970 (52 y.o. Samuel [name] Primary Care Shamere Campas: Bertram Denver Other Clinician: Referring Seneca Hoback: Treating Bassem Bernasconi/Extender: Altamese Dilling Weeks in Treatment: 0 Active Inactive Necrotic Tissue Nursing Diagnoses: Knowledge deficit related to management of necrotic/devitalized tissue Goals: Necrotic/devitalized tissue will be minimized in the wound bed Date Initiated: 05/14/2022 Target Resolution Date: 06/07/2022 Goal Status: Active Patient/caregiver will verbalize understanding of reason and process  for debridement of necrotic tissue Date Initiated: 05/14/2022 Target Resolution Date: 06/07/2022 Goal Status: Active Interventions: Provide education on necrotic tissue and debridement process Treatment Activities: Apply topical anesthetic as ordered : 05/14/2022 Notes: Orientation to the Wound Care Program Nursing Diagnoses: Knowledge deficit related to the wound healing center program Goals: Patient/caregiver will verbalize understanding of the Wound Healing Center Program Date Initiated: 05/14/2022 Target Resolution Date: 06/07/2022 Goal Status: Active Interventions: Provide education on orientation to the wound center Notes: Venous Leg Ulcer Nursing Diagnoses: Potential for venous Insuffiency (use before diagnosis confirmed) SONIA, STICKELS ([id number]) [id number] Page 8 of 14 Goals: Non-invasive venous studies are completed as ordered Date Initiated: 05/14/2022 Target Resolution Date: 06/06/2022 Goal Status: Active Patient/caregiver will verbalize understanding of disease process and disease management Date Initiated: 05/14/2022 Target Resolution Date: 06/06/2022 Goal Status: Active Interventions: Assess peripheral edema status  every visit. Compression as ordered Provide education on venous insufficiency Treatment Activities: Non-invasive vascular studies : 05/14/2022 Notes: Wound/Skin Impairment Nursing Diagnoses: Knowledge deficit related to ulceration/compromised skin integrity Goals: Patient/caregiver will verbalize understanding of skin care regimen Date Initiated: 05/14/2022 Target Resolution Date: 06/07/2022 Goal Status: Active Interventions: Assess patient/caregiver ability to perform ulcer/skin care regimen upon admission and as needed Assess ulceration(s) every visit Provide education on ulcer and skin care Treatment Activities: Topical wound management initiated : 05/14/2022 Notes: Electronic Signature(s) Signed: 05/14/2022 4:54:11 PM By: Shawn Stall RN, BSN Entered By: Shawn Stall on 05/14/2022 09:39:26 -------------------------------------------------------------------------------- Pain Assessment Details Patient Name: Date of Service: Samuel Starch G. 05/14/2022 8:45 Samuel [name] Medical Record Number: [id number] Patient Account Number: [id number] Date of Birth/Sex: Treating RN: 1970/12/30 (52 y.o. Samuel [name] Primary Care Stacee Earp: Bertram Denver Other Clinician: Referring Neamiah Sciarra: Treating Arnez Stoneking/Extender: Altamese Dilling Weeks in Treatment: 0 Active Problems Location of Pain Severity and Description of Pain Patient Has Paino No Site Locations [place], [place] Reece Agar ([id number]) [phone].pdf Page 9 of 14 Pain Management and Medication Current Pain Management: Notes per patient no pain at this time. Per patient at times in right foot. Electronic Signature(s) Signed: 05/14/2022 4:54:11 PM By: Shawn Stall RN, BSN Entered By: Shawn Stall on 05/14/2022 09:09:19 -------------------------------------------------------------------------------- Patient/Caregiver Education Details Patient Name: Date of Service: Samuel [name]  4/16/2024andnbsp8:45 Samuel [name] Medical Record Number: [id number] Patient Account Number: [id number] Date of Birth/Gender: Treating RN: 1970-06-04 (52 y.o. Samuel [name] Primary Care Physician: Bertram Denver Other Clinician: Referring Physician: Treating Physician/Extender: Benjamin Stain in Treatment: 0 Education Assessment Education Provided To: Patient Education Topics Provided Welcome T [facility] Packet: o Handouts: Samuel Guide to Pain Control, [facility] Pledge form, Welcome T [facility] o Methods: Explain/Verbal Responses: Reinforcements needed Electronic Signature(s) Signed: 05/14/2022 4:54:11 PM By: Shawn Stall RN, BSN Entered By: Shawn Stall on 05/14/2022 09:39:36 -------------------------------------------------------------------------------- Wound Assessment Details Patient Name: Date of Service: Samuel Starch G. 05/14/2022 8:45 Samuel [name] Medical Record Number: [id number] Patient Account Number: [id number] Date of Birth/Sex: Treating RN: March 25, 1970 (52 y.o. Samuel [name] Primary Care Kenlee Maler: Bertram Denver Other Clinician: Referring Verl Kitson: Treating Treydon Henricks/Extender: Altamese Dilling Weeks in Treatment: 0 Wound Status Wound Number: 7 Primary Venous Leg Ulcer Etiology: Wound Location: Right, Medial Lower Leg GERMAN, MANKE ([id number]) [phone].pdf Page 10 of 14 Wound Open Wounding Event: Gradually Appeared Status: Date Acquired: 11/12/2021 Comorbid Lymphedema, Asthma, Chronic Obstructive Pulmonary Disease Weeks Of Treatment: 0 History: (COPD), Hypertension, Peripheral Venous Disease, Confinement Clustered Wound: No Anxiety  Photos Wound Measurements Length: (cm) 2.9 Width: (cm) 1.9 Depth: (cm) 0.3 Area: (cm) 4.328 Volume: (cm) 1.298 % Reduction in Area: % Reduction in Volume: Epithelialization: None Tunneling: No Undermining: No Wound  Description Classification: Full Thickness Without Exposed Support Structures Wound Margin: Distinct, outline attached Exudate Amount: Medium Exudate Type: Serosanguineous Exudate Color: red, brown Foul Odor After Cleansing: No Slough/Fibrino No Wound Bed Granulation Amount: Large (67-100%) Exposed Structure Granulation Quality: Red Fascia Exposed: No Necrotic Amount: Small (1-33%) Fat Layer (Subcutaneous Tissue) Exposed: No Necrotic Quality: Adherent Slough Tendon Exposed: No Muscle Exposed: No Joint Exposed: No Bone Exposed: No Periwound Skin Texture Texture Color No Abnormalities Noted: No No Abnormalities Noted: No Callus: No Atrophie Blanche: No Crepitus: No Cyanosis: No Excoriation: No Ecchymosis: No Induration: No Erythema: No Rash: No Hemosiderin Staining: No Scarring: Yes Mottled: No Pallor: No Moisture Rubor: No No Abnormalities Noted: No Dry / Scaly: No Maceration: No Treatment Notes Wound #7 (Lower Leg) Wound Laterality: Right, Medial Cleanser Soap and Water Discharge Instruction: May shower and wash wound with dial antibacterial soap and water prior to dressing change. Vashe 5.8 (oz) Discharge Instruction: Cleanse the wound with Vashe prior to applying Samuel clean dressing using gauze sponges, not tissue or cotton balls. Peri-Wound Care Sween Lotion (Moisturizing lotion) Discharge Instruction: Apply moisturizing lotion as directed Topical [address] OSIRIS, ODRISCOLL ([id number]) [phone].pdf Page 11 of 14 Hydrofera Blue Ready Transfer Foam, 2.5x2.5 (in/in) Discharge Instruction: Apply directly to wound bed as directed Secondary Dressing ABD Pad, 8x10 Discharge Instruction: Apply over primary dressing as directed. Secured With Compression Wrap Urgo K2 Lite, two layer compression system, regular Discharge Instruction: Apply Urgo K2 Lite as directed (alternative to 3 layer compression). Compression  Stockings Add-Ons Electronic Signature(s) Signed: 05/14/2022 4:54:11 PM By: Shawn Stall RN, BSN Entered By: Shawn Stall on 05/14/2022 09:22:56 -------------------------------------------------------------------------------- Wound Assessment Details Patient Name: Date of Service: Samuel [name]. 05/14/2022 8:45 Samuel [name] Medical Record Number: [id number] Patient Account Number: [id number] Date of Birth/Sex: Treating RN: May 25, 1970 (52 y.o. Samuel [name] Primary Care Raygen Dahm: Bertram [place] Other Clinician: Referring Jonavon Trieu: Treating Samuel Ericsson/Extender: Altamese Dilling Weeks in Treatment: 0 Wound Status Wound Number: 8 Primary Venous Leg Ulcer Etiology: Wound Location: Right, Dorsal Foot Wound Open Wounding Event: Gradually Appeared Status: Date Acquired: 03/15/2022 Comorbid Lymphedema, Asthma, Chronic Obstructive Pulmonary Disease Weeks Of Treatment: 0 History: (COPD), Hypertension, Peripheral Venous Disease, Confinement Clustered Wound: No Anxiety Photos Wound Measurements Length: (cm) 0.3 Width: (cm) 0.9 Depth: (cm) 0.2 Area: (cm) 0.212 Volume: (cm) 0.042 % Reduction in Area: % Reduction in Volume: Epithelialization: None Tunneling: No Undermining: No Wound Description Classification: Full Thickness Without Exposed Support Wound Margin: Distinct, outline attached Exudate Amount: Medium Exudate Type: Serosanguineous Exudate Color: red, brown Structures Foul Odor After Cleansing: No Slough/Fibrino Yes Wound Bed Granulation Amount: Large (67-100%) Exposed Structure Granulation Quality: Pink Fascia Exposed: No Necrotic Amount: Small (1-33%) Fat Layer (Subcutaneous Tissue) Exposed: Yes Necrotic Quality: Adherent Slough Tendon Exposed: No KWAKU, MOSTAFA ([id number]) [id number] Page 12 of 14 Muscle Exposed: No Joint Exposed: No Bone Exposed: No Periwound Skin Texture Texture Color No Abnormalities Noted:  No No Abnormalities Noted: No Callus: No Atrophie Blanche: No Crepitus: No Cyanosis: No Excoriation: No Ecchymosis: No Induration: No Erythema: No Rash: No Hemosiderin Staining: No Scarring: Yes Mottled: No Pallor: No Moisture Rubor: No No Abnormalities Noted: No Dry / Scaly: No Maceration: No Treatment Notes Wound #8 (Foot) Wound Laterality: Dorsal, Right Cleanser Soap and Water Discharge Instruction:  May shower and wash wound with dial antibacterial soap and water prior to dressing change. Vashe 5.8 (oz) Discharge Instruction: Cleanse the wound with Vashe prior to applying Samuel clean dressing using gauze sponges, not tissue or cotton balls. Peri-Wound Care Sween Lotion (Moisturizing lotion) Discharge Instruction: Apply moisturizing lotion as directed Topical Primary Dressing Hydrofera Blue Ready Transfer Foam, 2.5x2.5 (in/in) Discharge Instruction: Apply directly to wound bed as directed Secondary Dressing ABD Pad, 8x10 Discharge Instruction: Apply over primary dressing as directed. Secured With Compression Wrap Urgo K2 Lite, two layer compression system, regular Discharge Instruction: Apply Urgo K2 Lite as directed (alternative to 3 layer compression). Compression Stockings Add-Ons Electronic Signature(s) Signed: 05/14/2022 4:54:11 PM By: Shawn Stall RN, BSN Entered By: Shawn Stall on 05/14/2022 09:23:32 -------------------------------------------------------------------------------- Wound Assessment Details Patient Name: Date of Service: Samuel [name]. 05/14/2022 8:45 Samuel [name] Medical Record Number: [id number] Patient Account Number: [id number] Date of Birth/Sex: Treating RN: Mar 31, 1970 (52 y.o. Samuel [name] Primary Care Anndrea Mihelich: Bertram Denver Other Clinician: Referring Jaryiah Mehlman: Treating Cordie Beazley/Extender: Altamese Dilling Weeks in Treatment: 0 Wound Status Wound Number: 9 Primary Venous Leg Ulcer Etiology: Wound Location: Left,  Medial Lower Leg Wound Open Wounding Event: Gradually Appeared Status: Date Acquired: 04/13/2022 Comorbid Lymphedema, Asthma, Chronic Obstructive Pulmonary Disease Weeks Of Treatment: 0 History: (COPD), Hypertension, Peripheral Venous Disease, Confinement Clustered Wound: No AADYN, BUCHHEIT ([id number]) [id number] Page 13 of 14 Clustered Wound: No Anxiety Photos Wound Measurements Length: (cm) 2.5 Width: (cm) 0.3 Depth: (cm) 0.1 Area: (cm) 0.589 Volume: (cm) 0.059 % Reduction in Area: % Reduction in Volume: Epithelialization: None Tunneling: No Undermining: No Wound Description Classification: Full Thickness Without Exposed Support Structures Wound Margin: Distinct, outline attached Exudate Amount: Medium Exudate Type: Serosanguineous Exudate Color: red, brown Foul Odor After Cleansing: No Slough/Fibrino Yes Wound Bed Granulation Amount: None Present (0%) Exposed Structure Necrotic Amount: Large (67-100%) Fascia Exposed: No Necrotic Quality: Eschar Fat Layer (Subcutaneous Tissue) Exposed: Yes Tendon Exposed: No Muscle Exposed: No Joint Exposed: No Bone Exposed: No Periwound Skin Texture Texture Color No Abnormalities Noted: No No Abnormalities Noted: No Callus: No Atrophie Blanche: No Crepitus: No Cyanosis: No Excoriation: No Ecchymosis: No Induration: No Erythema: No Rash: No Hemosiderin Staining: No Scarring: Yes Mottled: No Pallor: No Moisture Rubor: No No Abnormalities Noted: No Dry / Scaly: No Maceration: No Treatment Notes Wound #9 (Lower Leg) Wound Laterality: Left, Medial Cleanser Soap and Water Discharge Instruction: May shower and wash wound with dial antibacterial soap and water prior to dressing change. Vashe 5.8 (oz) Discharge Instruction: Cleanse the wound with Vashe prior to applying Samuel clean dressing using gauze sponges, not tissue or cotton balls. Peri-Wound Care Sween Lotion (Moisturizing  lotion) Discharge Instruction: Apply moisturizing lotion as directed Topical Primary Dressing Hydrofera Blue Ready Transfer Foam, 2.5x2.5 (in/in) Discharge Instruction: Apply directly to wound bed as directed EMMETT, BRACKNELL ([id number]) [phone].pdf Page 14 of 14 Secondary Dressing ABD Pad, 8x10 Discharge Instruction: Apply over primary dressing as directed. Secured With Compression Wrap Urgo K2 Lite, two layer compression system, regular Discharge Instruction: Apply Urgo K2 Lite as directed (alternative to 3 layer compression). Compression Stockings Add-Ons Electronic Signature(s) Signed: 05/14/2022 4:54:11 PM By: Shawn Stall RN, BSN Entered By: Shawn Stall on 05/14/2022 09:24:35 -------------------------------------------------------------------------------- Vitals Details Patient Name: Date of Service: Samuel [name]. 05/14/2022 8:45 Samuel [name] Medical Record Number: [id number] Patient Account Number: [id number] Date of Birth/Sex: Treating RN: 1970/04/28 (52 y.o. Samuel [name] Primary Care Nneka Blanda: Bertram Denver  Other Clinician: Referring Dillinger Aston: Treating Jamond Neels/Extender: Crist Fat, Shon Hale Weeks in Treatment: 0 Vital Signs Time Taken: 09:05 Temperature (F): 98.2 Height (in): 70 Pulse (bpm): 103 Source: Stated Respiratory Rate (breaths/min): 20 Weight (lbs): 370 Blood Pressure (mmHg): 168/101 Source: Stated Reference Range: 80 - 120 mg / dl Body Mass Index (BMI): 53.1 Electronic Signature(s) Signed: 05/14/2022 4:54:11 PM By: Shawn Stall RN, BSN Entered By: Shawn Stall on 05/14/2022 09:07:52

## 2022-05-15 NOTE — Progress Notes (Signed)
Samuel, [name] ([id number]) [id number] Page 1 of 4 Visit Report for 05/14/2022 Abuse Risk Screen Details Patient Name: Date of Service: Samuel [name] 05/14/2022 8:45 A M Medical Record Number: [id number] Patient Account Number: [id number] Date of Birth/Sex: Treating RN: 08-28-1970 (52 y.o. Samuel [name] Primary Care Zhamir Pirro: Bertram Denver Other Clinician: Referring Pama Roskos: Treating Jacqulene Huntley/Extender: Altamese Dilling Weeks in Treatment: 0 Abuse Risk Screen Items Answer ABUSE RISK SCREEN: Has anyone close to you tried to hurt or harm you recentlyo No Do you feel uncomfortable with anyone in your familyo No Has anyone forced you do things that you didnt want to doo No Electronic Signature(s) Signed: 05/14/2022 4:54:11 PM By: Shawn Stall RN, BSN Entered By: Shawn Stall on 05/14/2022 09:07:45 -------------------------------------------------------------------------------- Activities of Daily Living Details Patient Name: Date of Service: Samuel [name] 05/14/2022 8:45 A M Medical Record Number: [id number] Patient Account Number: [id number] Date of Birth/Sex: Treating RN: January 26, 1971 (52 y.o. Samuel [name] Primary Care Belkis Norbeck: Bertram Denver Other Clinician: Referring Ryleigh Esqueda: Treating Mykala Mccready/Extender: Altamese Dilling Weeks in Treatment: 0 Activities of Daily Living Items Answer Activities of Daily Living (Please select one for each item) Drive Automobile Completely Able T Medications ake Completely Able Use T elephone Completely Able Care for Appearance Completely Able Use T oilet Completely Able Bath / Shower Completely Able Dress Self Completely Able Feed Self Completely Able Walk Completely Able Get In / Out Bed Completely Able Housework Completely Able Prepare Meals Completely Able Handle Money Completely Able Shop for Self Completely Able Electronic  Signature(s) Signed: 05/14/2022 4:54:11 PM By: Shawn Stall RN, BSN Entered By: Shawn Stall on 05/14/2022 09:08:04 -------------------------------------------------------------------------------- Education Screening Details Patient Name: Date of Service: Samuel Starch G. 05/14/2022 8:45 A M Medical Record Number: [id number] Patient Account Number: [id number] Date of Birth/Sex: Treating RN: 01-14-1971 (52 y.o. Samuel [name] Primary Care Nichoel Digiulio: Bertram Denver Other Clinician: Referring Suhaila Troiano: Treating Alease Fait/Extender: Benjamin Stain in Treatment: [address] Samuel, [name] ([id number]) [id number].pdf Page 2 of 4 Primary Learner Assessed: Patient Learning Preferences/Education Level/Primary Language Learning Preference: Explanation, Demonstration, Printed Material Highest Education Level: College or Above Preferred Language: Economist Language Barrier: No Translator Needed: No Memory Deficit: No Emotional Barrier: No Cultural/Religious Beliefs Affecting Medical Care: No Physical Barrier Impaired Vision: No Impaired Hearing: No Decreased Hand dexterity: No Knowledge/Comprehension Knowledge Level: High Comprehension Level: High Ability to understand written instructions: High Ability to understand verbal instructions: High Motivation Anxiety Level: Calm Cooperation: Cooperative Education Importance: Acknowledges Need Interest in Health Problems: Asks Questions Perception: Coherent Willingness to Engage in Self-Management High Activities: Readiness to Engage in Self-Management High Activities: Electronic Signature(s) Signed: 05/14/2022 4:54:11 PM By: Shawn Stall RN, BSN Entered By: Shawn Stall on 05/14/2022 09:08:21 -------------------------------------------------------------------------------- Fall Risk Assessment Details Patient Name: Date of Service: Samuel Starch G. 05/14/2022  8:45 A M Medical Record Number: [id number] Patient Account Number: [id number] Date of Birth/Sex: Treating RN: 07-22-1970 (52 y.o. Samuel [name] Primary Care Alroy Portela: Bertram [place] Other Clinician: Referring Carver Murakami: Treating Hurley Blevins/Extender: Altamese Dilling Weeks in Treatment: 0 Fall Risk Assessment Items Have you had 2 or more falls in the last 12 monthso 0 No Have you had any fall that resulted in injury in the last 12 monthso 0 No FALLS RISK SCREEN History of falling - immediate or within 3 months 0 No Secondary diagnosis (Do you have 2 or more medical diagnoseso) 0 No  Ambulatory aid None/bed rest/wheelchair/nurse 0 Yes Crutches/cane/walker 0 No Furniture 0 No Intravenous therapy Access/Saline/Heparin Lock 0 No Gait/Transferring Normal/ bed rest/ wheelchair 0 Yes Weak (short steps with or without shuffle, stooped but able to lift head while walking, may seek 0 No support from furniture) Impaired (short steps with shuffle, may have difficulty arising from chair, head down, impaired 0 No balance) Mental Status Oriented to own ability 0 Yes Overestimates or forgets limitations 0 No Risk Level: Low Risk Score: 0 LYNCOLN, LEDGERWOOD ([id number]) [phone] [id number].pdf Page 3 of 4 Electronic Signature(s) -------------------------------------------------------------------------------- Foot Assessment Details Patient Name: Date of Service: Samuel [name] 05/14/2022 8:45 A M Medical Record Number: [id number] Patient Account Number: [id number] Date of Birth/Sex: Treating RN: Oct 06, 1970 (52 y.o. Samuel [name] Primary Care Lorriane Dehart: Bertram [place] Other Clinician: Referring Ulysses Alper: Treating Zale Marcotte/Extender: Altamese Dilling Weeks in Treatment: 0 Foot Assessment Items Site Locations + = Sensation present, - = Sensation absent, C = Callus, U = Ulcer R = Redness, W = Warmth, M = Maceration, PU = Pre-ulcerative  lesion F = Fissure, S = Swelling, D = Dryness Assessment Right: Left: Other Deformity: No No Prior Foot Ulcer: No No Prior Amputation: No No Charcot Joint: No No Ambulatory Status: Ambulatory Without Help Gait: Steady Electronic Signature(s) Signed: 05/14/2022 4:54:11 PM By: Shawn Stall RN, BSN Entered By: Shawn Stall on 05/14/2022 09:25:02 -------------------------------------------------------------------------------- Nutrition Risk Screening Details Patient Name: Date of Service: Samuel Starch G. 05/14/2022 8:45 A M Medical Record Number: [id number] Patient Account Number: [id number] Date of Birth/Sex: Treating RN: 09/17/1970 (52 y.o. Samuel [name] Primary Care Zerina Hallinan: Bertram Denver Other Clinician: Referring Haydn Hutsell: Treating Belkys Henault/Extender: Altamese Dilling Weeks in Treatment: 0 Height (in): 70 Weight (lbs): 370 Body Mass Index (BMI): 53.1 MARQUAVIS, HANNEN ([id number]) [phone] [id number] Page 4 of 4 Nutrition Risk Screening Items Score Screening NUTRITION RISK SCREEN: I have an illness or condition that made me change the kind and/or amount of food I eat 2 Yes I eat fewer than two meals per day 0 No I eat few fruits and vegetables, or milk products 0 No I have three or more drinks of beer, liquor or wine almost every day 0 No I have tooth or mouth problems that make it hard for me to eat 0 No I don't always have enough money to buy the food I need 0 No I eat alone most of the time 0 No I take three or more different prescribed or over-the-counter drugs a day 1 Yes Without wanting to, I have lost or gained 10 pounds in the last six months 0 No I am not always physically able to shop, cook and/or feed myself 0 No Nutrition Protocols Good Risk Protocol Moderate Risk Protocol 0 Provide education on nutrition High Risk Proctocol Risk Level: Moderate Risk Score: 3 Electronic Signature(s) Signed: 05/14/2022  4:54:11 PM By: Shawn Stall RN, BSN Entered By: Shawn Stall on 05/14/2022 09:09:00

## 2022-05-20 ENCOUNTER — Encounter (HOSPITAL_BASED_OUTPATIENT_CLINIC_OR_DEPARTMENT_OTHER): Payer: 59 | Admitting: Internal Medicine

## 2022-05-20 DIAGNOSIS — L97812 Non-pressure chronic ulcer of other part of right lower leg with fat layer exposed: Secondary | ICD-10-CM

## 2022-05-20 DIAGNOSIS — J4489 Other specified chronic obstructive pulmonary disease: Secondary | ICD-10-CM

## 2022-05-20 DIAGNOSIS — I87313 Chronic venous hypertension (idiopathic) with ulcer of bilateral lower extremity: Secondary | ICD-10-CM | POA: Diagnosis not present

## 2022-05-20 DIAGNOSIS — L97822 Non-pressure chronic ulcer of other part of left lower leg with fat layer exposed: Secondary | ICD-10-CM | POA: Diagnosis not present

## 2022-05-20 NOTE — Progress Notes (Signed)
ORA, MCNATT ([id number]) [id number] Page 1 of 8 Visit Report for 05/20/2022 Chief Complaint Document Details Patient Name: Date of Service: A Samuel [name] 05/20/2022 2:15 PM Medical Record Number: [id number] Patient Account Number: [id number] Date of Birth/Sex: Treating RN: May 23, 1970 (52 y.o. M) Primary Care Provider: Bertram Denver Other Clinician: Referring Provider: Treating Provider/Extender: Altamese Dilling Weeks in Treatment: 0 Information Obtained from: Patient Chief Complaint patient is here for review of wounds on his right medial lower leg and right lateral foot 06/27/17; patient returns to clinic today with a reopening on his bilateral medial lower calfs 05/14/2022; bilateral lower extremity wounds Electronic Signature(s) Signed: 05/20/2022 3:35:10 PM By: Geralyn Corwin DO Entered By: Geralyn Corwin on 05/20/2022 15:29:22 -------------------------------------------------------------------------------- HPI Details Patient Name: Date of Service: Samuel [name]. 05/20/2022 2:15 PM Medical Record Number: [id number] Patient Account Number: [id number] Date of Birth/Sex: Treating RN: 1970/12/24 (52 y.o. M) Primary Care Provider: Bertram Denver Other Clinician: Referring Provider: Treating Provider/Extender: Altamese Dilling Weeks in Treatment: 0 History of Present Illness HPI Description: 05/14/2022 Mr. Leaf Kernodle is a 52 year old male with a past medical history of venous insufficiency, COPD and OSA that presents to the clinic for a 69-month history of nonhealing ulcers to his legs bilaterally. He has compression stockings but has not been wearing them. He has been keeping the areas covered. He states he has a history of wounds that wax and wane in healing. He currently denies signs of infection. 4/22; patient presents for follow-up. The wraps had slid down over the past week. We have been  using Hydrofera Blue under 3 layer compression to the lower extremities bilaterally. Adding this he has no issues or complaints. Overall the wounds are smaller. The left lower extremity wounds have healed. Electronic Signature(s) Signed: 05/20/2022 3:35:10 PM By: Geralyn Corwin DO Entered By: Geralyn Corwin on 05/20/2022 15:29:53 -------------------------------------------------------------------------------- Physical Exam Details Patient Name: Date of Service: Samuel [name] 05/20/2022 2:15 PM Medical Record Number: [id number] Patient Account Number: [id number] Date of Birth/Sex: Treating RN: 09-11-1970 (52 y.o. M) Primary Care Provider: Bertram Denver Other Clinician: Referring Provider: Treating Provider/Extender: Altamese Dilling Weeks in Treatment: 0 Constitutional respirations regular, non-labored and within target range for patient.. Cardiovascular 2+ dorsalis pedis/posterior tibialis pulses. Psychiatric pleasant and cooperative. Samuel [name], TEBBETTS ([id number]) [id number] Page 2 of 8 Notes Left lower extremity: Epithelialization to the previous wound site. Right lower extremity: T the anterior aspect of the leg and dorsal aspect of the foot there is o an open wound with granulation tissue. Good edema control. Significant hemosiderin staining noted throughout the legs. No surrounding signs of infection. Electronic Signature(s) Signed: 05/20/2022 3:35:10 PM By: Geralyn Corwin DO Entered By: Geralyn Corwin on 05/20/2022 15:32:22 -------------------------------------------------------------------------------- Physician Orders Details Patient Name: Date of Service: Samuel [name] 05/20/2022 2:15 PM Medical Record Number: [id number] Patient Account Number: [id number] Date of Birth/Sex: Treating RN: Mar 19, 1970 (52 y.o. Cline Cools Primary Care Provider: Bertram Denver Other Clinician: Referring  Provider: Treating Provider/Extender: Altamese Dilling Weeks in Treatment: 0 Verbal / Phone Orders: No Diagnosis Coding Follow-up Appointments ppointment in 1 week. - Dr. Mikey Bussing Monday 05/27/2022 room 8 3pm Return A ppointment in 2 weeks. - Dr. Mikey Bussing Monday Return A Anesthetic (In clinic) Topical Lidocaine 4% applied to wound bed Bathing/ Shower/ Hygiene May shower with protection but do not get wound dressing(s) wet. Protect dressing(s) with water repellant cover (for example, large plastic bag)  or a cast cover and may then take shower. Edema Control - Lymphedema / SCD / Other Elevate legs to the level of the heart or above for 30 minutes daily and/or when sitting for 3-4 times a day throughout the day. A void standing for long periods of time. Exercise regularly If compression wraps slide down please call wound center and speak with a nurse. Wound Treatment Wound #7 - Lower Leg Wound Laterality: Right, Medial Cleanser: Soap and Water 1 x Per Week/30 Days Discharge Instructions: May shower and wash wound with dial antibacterial soap and water prior to dressing change. Cleanser: Vashe 5.8 (oz) 1 x Per Week/30 Days Discharge Instructions: Cleanse the wound with Vashe prior to applying a clean dressing using gauze sponges, not tissue or cotton balls. Peri-Wound Care: Sween Lotion (Moisturizing lotion) 1 x Per Week/30 Days Discharge Instructions: Apply moisturizing lotion as directed Prim Dressing: Hydrofera Blue Ready Transfer Foam, 2.5x2.5 (in/in) 1 x Per Week/30 Days ary Discharge Instructions: Apply directly to wound bed as directed Secondary Dressing: ABD Pad, 8x10 1 x Per Week/30 Days Discharge Instructions: Apply over primary dressing as directed. Compression Wrap: Urgo K2 Lite, two layer compression system, regular 1 x Per Week/30 Days Discharge Instructions: Apply Urgo K2 Lite as directed (alternative to 3 layer compression). Wound #8 - Foot Wound  Laterality: Dorsal, Right Cleanser: Soap and Water 1 x Per Week/30 Days Discharge Instructions: May shower and wash wound with dial antibacterial soap and water prior to dressing change. Cleanser: Vashe 5.8 (oz) 1 x Per Week/30 Days Discharge Instructions: Cleanse the wound with Vashe prior to applying a clean dressing using gauze sponges, not tissue or cotton balls. Peri-Wound Care: Sween Lotion (Moisturizing lotion) 1 x Per Week/30 Days Discharge Instructions: Apply moisturizing lotion as directed Prim Dressing: Hydrofera Blue Ready Transfer Foam, 2.5x2.5 (in/in) 1 x Per Week/30 Days ary Discharge Instructions: Apply directly to wound bed as directed Secondary Dressing: ABD Pad, 8x10 1 x Per Week/30 Days Discharge Instructions: Apply over primary dressing as directed. KORDE, JEPPSEN ([id number]) [id number] Page 3 of 8 Compression Wrap: Urgo K2 Lite, two layer compression system, regular 1 x Per Week/30 Days Discharge Instructions: Apply Urgo K2 Lite as directed (alternative to 3 layer compression). Patient Medications llergies: shellfish derived A Notifications Medication Indication Start End 05/20/2022 lidocaine DOSE topical 4 % cream - cream topical once daily Electronic Signature(s) Signed: 05/20/2022 3:35:10 PM By: Geralyn Corwin DO Entered By: Geralyn Corwin on 05/20/2022 15:32:30 -------------------------------------------------------------------------------- Problem List Details Patient Name: Date of Service: Samuel [name] 05/20/2022 2:15 PM Medical Record Number: [id number] Patient Account Number: [id number] Date of Birth/Sex: Treating RN: 1970/12/21 (52 y.o. M) Primary Care Provider: Bertram Denver Other Clinician: Referring Provider: Treating Provider/Extender: Altamese Dilling Weeks in Treatment: 0 Active Problems ICD-10 Encounter Code Description Active Date MDM Diagnosis L97.812 Non-pressure chronic  ulcer of other part of right lower leg with fat layer 05/14/2022 No Yes exposed L97.822 Non-pressure chronic ulcer of other part of left lower leg with fat layer exposed4/16/2024 No Yes I87.313 Chronic venous hypertension (idiopathic) with ulcer of bilateral lower extremity 05/14/2022 No Yes J44.89 Other specified chronic obstructive pulmonary disease 05/14/2022 No Yes Inactive Problems Resolved Problems Electronic Signature(s) Signed: 05/20/2022 3:35:10 PM By: Geralyn Corwin DO Entered By: Geralyn Corwin on 05/20/2022 15:28:43 -------------------------------------------------------------------------------- Progress Note Details Patient Name: Date of Service: Samuel [name] 05/20/2022 2:15 PM Medical Record Number: [id number] Patient Account Number: [id number] Date of Birth/Sex: Treating RN: Sep 17, 1970 (  52 y.o. M) Primary Care Provider: Bertram Denver Other Clinician: Referring Provider: Treating Provider/Extender: Benjamin Stain in Treatment: [address] SHRIHAN, PUTT ([id number]) [id number] Page 4 of 8 Subjective Chief Complaint Information obtained from Patient patient is here for review of wounds on his right medial lower leg and right lateral foot 06/27/17; patient returns to clinic today with a reopening on his bilateral medial lower calfs 05/14/2022; bilateral lower extremity wounds History of Present Illness (HPI) 05/14/2022 Mr. Samuel [name] is a 52 year old male with a past medical history of venous insufficiency, COPD and OSA that presents to the clinic for a 26-month history of nonhealing ulcers to his legs bilaterally. He has compression stockings but has not been wearing them. He has been keeping the areas covered. He states he has a history of wounds that wax and wane in healing. He currently denies signs of infection. 4/22; patient presents for follow-up. The wraps had slid down over the past week. We have been using  Hydrofera Blue under 3 layer compression to the lower extremities bilaterally. Adding this he has no issues or complaints. Overall the wounds are smaller. The left lower extremity wounds have healed. Patient History Information obtained from Patient. Family History Hypertension - Mother, Lung Disease - Mother, No family history of Cancer, Diabetes, Heart Disease, Hereditary Spherocytosis, Kidney Disease, Seizures, Stroke, Thyroid Problems, Tuberculosis. Social History Former smoker - quit 11 yrs ago, Marital Status - Single, Alcohol Use - Rarely, Drug Use - Current History - TCH, Caffeine Use - Moderate - coffee. Medical History Eyes Denies history of Cataracts, Glaucoma, Optic Neuritis Ear/Nose/Mouth/Throat Denies history of Chronic sinus problems/congestion, Middle ear problems Hematologic/Lymphatic Patient has history of Lymphedema Denies history of Anemia, Hemophilia, Human Immunodeficiency Virus, Sickle Cell Disease Respiratory Patient has history of Asthma, Chronic Obstructive Pulmonary Disease (COPD) Denies history of Aspiration, Pneumothorax, Sleep Apnea, Tuberculosis Cardiovascular Patient has history of Hypertension, Peripheral Venous Disease Denies history of Angina, Arrhythmia, Congestive Heart Failure, Coronary Artery Disease, Deep Vein Thrombosis, Hypotension, Myocardial Infarction, Peripheral Arterial Disease, Phlebitis, Vasculitis Gastrointestinal Denies history of Cirrhosis , Colitis, Crohnoos, Hepatitis A, Hepatitis B, Hepatitis C Endocrine Denies history of Type I Diabetes, Type II Diabetes Genitourinary Denies history of End Stage Renal Disease Immunological Denies history of Lupus Erythematosus, Raynaudoos, Scleroderma Integumentary (Skin) Denies history of History of Burn Musculoskeletal Denies history of Gout, Rheumatoid Arthritis, Osteoarthritis, Osteomyelitis Neurologic Denies history of Dementia, Neuropathy, Quadriplegia, Paraplegia, Seizure  Disorder Oncologic Denies history of Received Chemotherapy, Received Radiation Psychiatric Patient has history of Confinement Anxiety Denies history of Anorexia/bulimia Medical A Surgical History Notes nd Constitutional Symptoms (General Health) morbid obesity Objective Constitutional respirations regular, non-labored and within target range for patient.. Vitals Time Taken: 3:04 PM, Height: 70 in, Weight: 370 lbs, BMI: 53.1, Temperature: 98 F, Pulse: 101 bpm, Respiratory Rate: 18 breaths/min, Blood Pressure: 155/90 mmHg. Cardiovascular 2+ dorsalis pedis/posterior tibialis pulses. Psychiatric CHANCELER, PULLIN ([id number]) [phone].pdf Page 5 of 8 pleasant and cooperative. General Notes: Left lower extremity: Epithelialization to the previous wound site. Right lower extremity: T the anterior aspect of the leg and dorsal aspect of o the foot there is an open wound with granulation tissue. Good edema control. Significant hemosiderin staining noted throughout the legs. No surrounding signs of infection. Integumentary (Hair, Skin) Wound #7 status is Open. Original cause of wound was Gradually Appeared. The date acquired was: 11/12/2021. The wound is located on the Right,Medial Lower Leg. The wound measures 2.3cm length x 1.8cm width x 0.2cm depth; 3.252cm^2 area  and 0.65cm^3 volume. There is no tunneling or undermining noted. There is a medium amount of serosanguineous drainage noted. The wound margin is distinct with the outline attached to the wound base. There is large (67- 100%) red granulation within the wound bed. There is a small (1-33%) amount of necrotic tissue within the wound bed including Adherent Slough. The periwound skin appearance exhibited: Scarring. The periwound skin appearance did not exhibit: Callus, Crepitus, Excoriation, Induration, Rash, Dry/Scaly, Maceration, Atrophie Blanche, Cyanosis, Ecchymosis, Hemosiderin Staining, Mottled, Pallor,  Rubor, Erythema. Wound #8 status is Open. Original cause of wound was Gradually Appeared. The date acquired was: 03/15/2022. The wound is located on the Right,Dorsal Foot. The wound measures 0.2cm length x 0.4cm width x 0.3cm depth; 0.063cm^2 area and 0.019cm^3 volume. There is Fat Layer (Subcutaneous Tissue) exposed. There is no tunneling or undermining noted. There is a medium amount of serosanguineous drainage noted. The wound margin is distinct with the outline attached to the wound base. There is large (67-100%) pink granulation within the wound bed. There is a small (1-33%) amount of necrotic tissue within the wound bed including Adherent Slough. The periwound skin appearance exhibited: Scarring. The periwound skin appearance did not exhibit: Callus, Crepitus, Excoriation, Induration, Rash, Dry/Scaly, Maceration, Atrophie Blanche, Cyanosis, Ecchymosis, Hemosiderin Staining, Mottled, Pallor, Rubor, Erythema. Wound #9 status is Open. Original cause of wound was Gradually Appeared. The date acquired was: 04/13/2022. The wound is located on the Left,Medial Lower Leg. The wound measures 0cm length x 0cm width x 0cm depth; 0cm^2 area and 0cm^3 volume. There is Fat Layer (Subcutaneous Tissue) exposed. There is no tunneling or undermining noted. There is a none present amount of drainage noted. The wound margin is distinct with the outline attached to the wound base. There is no granulation within the wound bed. There is no necrotic tissue within the wound bed. The periwound skin appearance exhibited: Scarring. The periwound skin appearance did not exhibit: Callus, Crepitus, Excoriation, Induration, Rash, Dry/Scaly, Maceration, Atrophie Blanche, Cyanosis, Ecchymosis, Hemosiderin Staining, Mottled, Pallor, Rubor, Erythema. Assessment Active Problems ICD-10 Non-pressure chronic ulcer of other part of right lower leg with fat layer exposed Non-pressure chronic ulcer of other part of left lower leg with fat  layer exposed Chronic venous hypertension (idiopathic) with ulcer of bilateral lower extremity Other specified chronic obstructive pulmonary disease Patient's left lower extremity wounds have healed. He has compression stockings at home. I recommended he wear his compression stocking daily to this lower extremity. We will give him Tubigrip in office. The right lower extremity wounds appear well-healing. I recommended continuing the course with Hydrofera Blue under 3 layer compression. We will add an Unna boot layer at the top to keep it in place. Follow-up in 1 week. Procedures Wound #7 Pre-procedure diagnosis of Wound #7 is a Venous Leg Ulcer located on the Right,Medial Lower Leg . There was a Three Layer Compression Therapy Procedure by Redmond Pulling, RN. Post procedure Diagnosis Wound #7: Same as Pre-Procedure Plan Follow-up Appointments: Return Appointment in 1 week. - Dr. Mikey Bussing Monday 05/27/2022 room 8 3pm Return Appointment in 2 weeks. - Dr. Mikey Bussing Monday Anesthetic: (In clinic) Topical Lidocaine 4% applied to wound bed Bathing/ Shower/ Hygiene: May shower with protection but do not get wound dressing(s) wet. Protect dressing(s) with water repellant cover (for example, large plastic bag) or a cast cover and may then take shower. Edema Control - Lymphedema / SCD / Other: Elevate legs to the level of the heart or above for 30 minutes daily and/or when  sitting for 3-4 times a day throughout the day. Avoid standing for long periods of time. Exercise regularly If compression wraps slide down please call wound center and speak with a nurse. The following medication(s) was prescribed: lidocaine topical 4 % cream cream topical once daily was prescribed at facility WOUND #7: - Lower Leg Wound Laterality: Right, Medial Cleanser: Soap and Water 1 x Per Week/30 Days Discharge Instructions: May shower and wash wound with dial antibacterial soap and water prior to dressing change. Cleanser:  Vashe 5.8 (oz) 1 x Per Week/30 Days Discharge Instructions: Cleanse the wound with Vashe prior to applying a clean dressing using gauze sponges, not tissue or cotton balls. Peri-Wound Care: Sween Lotion (Moisturizing lotion) 1 x Per Week/30 Days Discharge Instructions: Apply moisturizing lotion as directed JERAY, SHUGART ([id number]) [id number] Page 6 of 8 Prim Dressing: Hydrofera Blue Ready Transfer Foam, 2.5x2.5 (in/in) 1 x Per Week/30 Days ary Discharge Instructions: Apply directly to wound bed as directed Secondary Dressing: ABD Pad, 8x10 1 x Per Week/30 Days Discharge Instructions: Apply over primary dressing as directed. Com pression Wrap: Urgo K2 Lite, two layer compression system, regular 1 x Per Week/30 Days Discharge Instructions: Apply Urgo K2 Lite as directed (alternative to 3 layer compression). WOUND #8: - Foot Wound Laterality: Dorsal, Right Cleanser: Soap and Water 1 x Per Week/30 Days Discharge Instructions: May shower and wash wound with dial antibacterial soap and water prior to dressing change. Cleanser: Vashe 5.8 (oz) 1 x Per Week/30 Days Discharge Instructions: Cleanse the wound with Vashe prior to applying a clean dressing using gauze sponges, not tissue or cotton balls. Peri-Wound Care: Sween Lotion (Moisturizing lotion) 1 x Per Week/30 Days Discharge Instructions: Apply moisturizing lotion as directed Prim Dressing: Hydrofera Blue Ready Transfer Foam, 2.5x2.5 (in/in) 1 x Per Week/30 Days ary Discharge Instructions: Apply directly to wound bed as directed Secondary Dressing: ABD Pad, 8x10 1 x Per Week/30 Days Discharge Instructions: Apply over primary dressing as directed. Com pression Wrap: Urgo K2 Lite, two layer compression system, regular 1 x Per Week/30 Days Discharge Instructions: Apply Urgo K2 Lite as directed (alternative to 3 layer compression). 1. Hydrofera Blue under 3 layer compressionooright lower extremity 2.  Compression stocking daily to the left lower extremity 3. Follow-up in 1 week Electronic Signature(s) Signed: 05/21/2022 5:19:30 PM By: Shawn Stall RN, BSN Signed: 05/22/2022 9:23:26 AM By: Geralyn Corwin DO Previous Signature: 05/20/2022 3:35:10 PM Version By: Geralyn Corwin DO Entered By: Shawn Stall on 05/21/2022 16:50:22 -------------------------------------------------------------------------------- HxROS Details Patient Name: Date of Service: Samuel [name]. 05/20/2022 2:15 PM Medical Record Number: [id number] Patient Account Number: [id number] Date of Birth/Sex: Treating RN: [birth year] (52 y.o. M) Primary Care Provider: Bertram Denver Other Clinician: Referring Provider: Treating Provider/Extender: Altamese Dilling Weeks in Treatment: 0 Information Obtained From Patient Constitutional Symptoms (General Health) Medical History: Past Medical History Notes: morbid obesity Eyes Medical History: Negative for: Cataracts; Glaucoma; Optic Neuritis Ear/Nose/Mouth/Throat Medical History: Negative for: Chronic sinus problems/congestion; Middle ear problems Hematologic/Lymphatic Medical History: Positive for: Lymphedema Negative for: Anemia; Hemophilia; Human Immunodeficiency Virus; Sickle Cell Disease Respiratory Medical History: Positive for: Asthma; Chronic Obstructive Pulmonary Disease (COPD) Negative for: Aspiration; Pneumothorax; Sleep Apnea; Tuberculosis Cardiovascular Medical History: Positive for: Hypertension; Peripheral Venous Disease Negative for: Angina; Arrhythmia; Congestive Heart Failure; Coronary Artery Disease; Deep Vein Thrombosis; Hypotension; Myocardial Infarction; Peripheral ANAIS, KOENEN ([id number]) [phone].pdf Page 7 of 8 Arterial Disease; Phlebitis; Vasculitis Gastrointestinal Medical History: Negative for: Cirrhosis ; Colitis; Crohns; Hepatitis A;  Hepatitis B; Hepatitis C Endocrine Medical  History: Negative for: Type I Diabetes; Type II Diabetes Genitourinary Medical History: Negative for: End Stage Renal Disease Immunological Medical History: Negative for: Lupus Erythematosus; Raynauds; Scleroderma Integumentary (Skin) Medical History: Negative for: History of Burn Musculoskeletal Medical History: Negative for: Gout; Rheumatoid Arthritis; Osteoarthritis; Osteomyelitis Neurologic Medical History: Negative for: Dementia; Neuropathy; Quadriplegia; Paraplegia; Seizure Disorder Oncologic Medical History: Negative for: Received Chemotherapy; Received Radiation Psychiatric Medical History: Positive for: Confinement Anxiety Negative for: Anorexia/bulimia Immunizations Pneumococcal Vaccine: Received Pneumococcal Vaccination: Yes Received Pneumococcal Vaccination On or After 60th Birthday: Yes Tetanus Vaccine: Last tetanus shot: 02/08/2014 Implantable Devices None Family and Social History Cancer: No; Diabetes: No; Heart Disease: No; Hereditary Spherocytosis: No; Hypertension: Yes - Mother; Kidney Disease: No; Lung Disease: Yes - Mother; Seizures: No; Stroke: No; Thyroid Problems: No; Tuberculosis: No; Former smoker - quit 11 yrs ago; Marital Status - Single; Alcohol Use: Rarely; Drug Use: Current History - TCH; Caffeine Use: Moderate - coffee; Financial Concerns: No; Food, Clothing or Shelter Needs: No; Support System Lacking: No; Transportation Concerns: No Electronic Signature(s) Signed: 05/20/2022 3:35:10 PM By: Geralyn Corwin DO Entered By: Geralyn Corwin on 05/20/2022 15:30:00 -------------------------------------------------------------------------------- SuperBill Details Patient Name: Date of Service: Samuel [name] 05/20/2022 Medical Record Number: [id number] Patient Account Number: [id number] Date of Birth/Sex: Treating RN: Jan 28, 1971 (52 y.o. Cherie Ouch, Durwin Glaze ([id number]) [id number] Page 8 of 8 Primary Care  Provider: Bertram Denver Other Clinician: Referring Provider: Treating Provider/Extender: Altamese Dilling Weeks in Treatment: 0 Diagnosis Coding ICD-10 Codes Code Description [phone] Non-pressure chronic ulcer of other part of right lower leg with fat layer exposed L97.822 Non-pressure chronic ulcer of other part of left lower leg with fat layer exposed I87.313 Chronic venous hypertension (idiopathic) with ulcer of bilateral lower extremity J44.89 Other specified chronic obstructive pulmonary disease Facility Procedures : CPT4 Code: [id number] Description: (Facility Use Only) [phone] - APPLY MULTLAY COMPRS LWR RT LEG Modifier: Quantity: 1 Physician Procedures : CPT4 Code Description Modifier [id number] [id number] - WC PHYS LEVEL 3 - EST PT ICD-10 Diagnosis Description L97.812 Non-pressure chronic ulcer of other part of right lower leg with fat layer exposed L97.822 Non-pressure chronic ulcer of other part of left  lower leg with fat layer exposed I87.313 Chronic venous hypertension (idiopathic) with ulcer of bilateral lower extremity J44.89 Other specified chronic obstructive pulmonary disease Quantity: 1 Electronic Signature(s) Signed: 05/20/2022 4:20:37 PM By: Redmond Pulling RN, BSN Signed: 05/21/2022 9:33:30 AM By: Geralyn Corwin DO Previous Signature: 05/20/2022 3:35:10 PM Version By: Geralyn Corwin DO Entered By: Redmond Pulling on 05/20/2022 15:42:13

## 2022-05-20 NOTE — Progress Notes (Signed)
[name], Samuel ([id number]) [id number] Page 1 of 9 Visit Report for 05/20/2022 Arrival Information Details Patient Name: Date of Service: Samuel [name] 05/20/2022 2:15 PM Medical Record Number: [id number] Patient Account Number: [id number] Date of Birth/Sex: Treating RN: 10-May-1970 (52 y.o. M) Primary Care Asiah Browder: Bertram Denver Other Clinician: Referring Maida Widger: Treating Marlie Kuennen/Extender: Altamese Dilling Weeks in Treatment: 0 Visit Information History Since Last Visit Added or deleted any medications: No Patient Arrived: Ambulatory Any new allergies or adverse reactions: No Arrival Time: 15:00 Had Samuel fall or experienced change in No Accompanied By: self activities of daily living that may affect Transfer Assistance: None risk of falls: Patient Identification Verified: Yes Signs or symptoms of abuse/neglect since last visito No Secondary Verification Process Completed: Yes Hospitalized since last visit: No Patient Requires Transmission-Based Precautions: No Implantable device outside of the clinic excluding No cellular tissue based products placed in the center since last visit: Has Compression in Place as Prescribed: Yes Pain Present Now: No Electronic Signature(s) Signed: 05/20/2022 4:08:22 PM By: Thayer Dallas Entered By: Thayer Dallas on 05/20/2022 15:04:47 -------------------------------------------------------------------------------- Compression Therapy Details Patient Name: Date of Service: Samuel [name] 05/20/2022 2:15 PM Medical Record Number: [id number] Patient Account Number: [id number] Date of Birth/Sex: Treating RN: 1970-08-21 (52 y.o. Cline Cools Primary Care Kelli Robeck: Bertram [place] Other Clinician: Referring Sriyan Cutting: Treating Kyian Obst/Extender: Altamese Dilling Weeks in Treatment: 0 Compression Therapy Performed for Wound Assessment: Wound #7 Right,Medial Lower  Leg Performed By: Clinician Redmond Pulling, RN Compression Type: Three Layer Post Procedure Diagnosis Same as Pre-procedure Electronic Signature(s) Signed: 05/20/2022 4:20:37 PM By: Redmond Pulling RN, BSN Entered By: Redmond Pulling on 05/20/2022 15:41:13 -------------------------------------------------------------------------------- Encounter Discharge Information Details Patient Name: Date of Service: Samuel [name] 05/20/2022 2:15 PM Medical Record Number: [id number] Patient Account Number: [id number] Date of Birth/Sex: Treating RN: February 07, 1970 (52 y.o. Cline Cools Primary Care Keenen Roessner: Bertram Denver Other Clinician: Referring Jenika Chiem: Treating Areta Terwilliger/Extender: Altamese Dilling Weeks in Treatment: 0 Encounter Discharge Information Items Discharge Condition: Stable Ambulatory Status: Ambulatory Discharge Destination: Home Transportation: Private Auto Accompanied By: self Samuel [name] ([id number]) [id number] Page 2 of 9 Schedule Follow-up Appointment: Yes Clinical Summary of Care: Patient Declined Electronic Signature(s) Signed: 05/20/2022 4:20:37 PM By: Redmond Pulling RN, BSN Entered By: Redmond Pulling on 05/20/2022 15:43:07 -------------------------------------------------------------------------------- Lower Extremity Assessment Details Patient Name: Date of Service: Samuel [name]. 05/20/2022 2:15 PM Medical Record Number: [id number] Patient Account Number: [id number] Date of Birth/Sex: Treating RN: 1970-06-11 (52 y.o. Cline Cools Primary Care Clemencia Helzer: Bertram Denver Other Clinician: Referring Aime Carreras: Treating Betheny Suchecki/Extender: Altamese Dilling Weeks in Treatment: 0 Edema Assessment Assessed: [Left: No] [Right: No] [Left: Edema] [Right: :] Calf Left: Right: Point of Measurement: 31 cm From Medial Instep 50 cm 53 cm Ankle Left: Right: Point of Measurement: 13 cm From  Medial Instep 29 cm 27 cm Vascular Assessment Pulses: Dorsalis Pedis Palpable: [Left:Yes] [Right:Yes] Electronic Signature(s) Signed: 05/20/2022 4:20:37 PM By: Redmond Pulling RN, BSN Entered By: Redmond Pulling on 05/20/2022 15:11:43 -------------------------------------------------------------------------------- Multi Wound Chart Details Patient Name: Date of Service: Samuel [name] 05/20/2022 2:15 PM Medical Record Number: [id number] Patient Account Number: [id number] Date of Birth/Sex: Treating RN: Jun 08, 1970 (52 y.o. M) Primary Care Yogesh Cominsky: Bertram Denver Other Clinician: Referring Effie Wahlert: Treating Lavelle Akel/Extender: Altamese Dilling Weeks in Treatment: 0 Vital Signs Height(in): 70 Pulse(bpm): 101 Weight(lbs): 370 Blood Pressure(mmHg): 155/90 Body Mass Index(BMI): 53.1 Temperature(F):  98 Respiratory Rate(breaths/min): 18 [7:Photos:] [id number] Page 3 of 9] Right, Medial Lower Leg Right, Dorsal Foot Left, Medial Lower Leg Wound Location: Gradually Appeared Gradually Appeared Gradually Appeared Wounding Event: Venous Leg Ulcer Venous Leg Ulcer Venous Leg Ulcer Primary Etiology: Lymphedema, Asthma, Chronic Lymphedema, Asthma, Chronic Lymphedema, Asthma, Chronic Comorbid History: Obstructive Pulmonary Disease Obstructive Pulmonary Disease Obstructive Pulmonary Disease (COPD), Hypertension, Peripheral (COPD), Hypertension, Peripheral (COPD), Hypertension, Peripheral Venous Disease, Confinement Anxiety Venous Disease, Confinement Anxiety Venous Disease, Confinement Anxiety 11/12/2021 03/15/2022 04/13/2022 Date Acquired: 0 0 0 Weeks of Treatment: Open Open Open Wound Status: No No No Wound Recurrence: 2.3x1.8x0.2 0.2x0.4x0.3 0x0x0 Measurements L x W x D (cm) 3.252 0.063 0 Samuel (cm) : rea 0.65 0.019 0 Volume (cm) : 24.90% 70.30% 100.00% % Reduction in Area: 49.90% 54.80% 100.00% % Reduction in Volume: Full  Thickness Without Exposed Full Thickness Without Exposed Full Thickness Without Exposed Classification: Support Structures Support Structures Support Structures Medium Medium None Present Exudate Amount: Serosanguineous Serosanguineous N/Samuel Exudate Type: red, brown red, brown N/Samuel Exudate Color: Distinct, outline attached Distinct, outline attached Distinct, outline attached Wound Margin: Large (67-100%) Large (67-100%) None Present (0%) Granulation Amount: Red Pink N/Samuel Granulation Quality: Small (1-33%) Small (1-33%) None Present (0%) Necrotic Amount: Fascia: No Fat Layer (Subcutaneous Tissue): Yes Fat Layer (Subcutaneous Tissue): Yes Exposed Structures: Fat Layer (Subcutaneous Tissue): No Fascia: No Fascia: No Tendon: No Tendon: No Tendon: No Muscle: No Muscle: No Muscle: No Joint: No Joint: No Joint: No Bone: No Bone: No Bone: No None None None Epithelialization: Scarring: Yes Scarring: Yes Scarring: Yes Periwound Skin Texture: Excoriation: No Excoriation: No Excoriation: No Induration: No Induration: No Induration: No Callus: No Callus: No Callus: No Crepitus: No Crepitus: No Crepitus: No Rash: No Rash: No Rash: No Maceration: No Maceration: No Maceration: No Periwound Skin Moisture: Dry/Scaly: No Dry/Scaly: No Dry/Scaly: No Atrophie Blanche: No Atrophie Blanche: No Atrophie Blanche: No Periwound Skin Color: Cyanosis: No Cyanosis: No Cyanosis: No Ecchymosis: No Ecchymosis: No Ecchymosis: No Erythema: No Erythema: No Erythema: No Hemosiderin Staining: No Hemosiderin Staining: No Hemosiderin Staining: No Mottled: No Mottled: No Mottled: No Pallor: No Pallor: No Pallor: No Rubor: No Rubor: No Rubor: No Treatment Notes Electronic Signature(s) Signed: 05/20/2022 3:35:10 PM By: Geralyn Corwin DO Entered By: Geralyn Corwin on 05/20/2022  15:28:48 -------------------------------------------------------------------------------- Multi-Disciplinary Care Plan Details Patient Name: Date of Service: Samuel [name]. 05/20/2022 2:15 PM Medical Record Number: [id number] Patient Account Number: [id number] Date of Birth/Sex: Treating RN: 15-Sep-1970 (52 y.o. Cline Cools Primary Care Allyiah Gartner: Bertram Denver Other Clinician: Referring Taniesha Glanz: Treating Sylvana Bonk/Extender: Altamese Dilling Weeks in Treatment: 0 Active Inactive Venous Leg Ulcer Nursing Diagnoses: Potential for venous Insuffiency (use before diagnosis confirmed) Goals: Non-invasive venous studies are completed as ordered Date Initiated: 05/14/2022 Target Resolution Date: 06/06/2022 Samuel, [name] ([id number]) [id number] Page 4 of 9 Goal Status: Active Patient/caregiver will verbalize understanding of disease process and disease management Date Initiated: 05/14/2022 Target Resolution Date: 06/06/2022 Goal Status: Active Interventions: Assess peripheral edema status every visit. Compression as ordered Provide education on venous insufficiency Treatment Activities: Non-invasive vascular studies : 05/14/2022 Notes: Wound/Skin Impairment Nursing Diagnoses: Knowledge deficit related to ulceration/compromised skin integrity Goals: Patient/caregiver will verbalize understanding of skin care regimen Date Initiated: 05/14/2022 Target Resolution Date: 06/07/2022 Goal Status: Active Interventions: Assess patient/caregiver ability to perform ulcer/skin care regimen upon admission and as needed Assess ulceration(s) every visit Provide education on ulcer and skin care Treatment Activities: Topical wound management initiated : 05/14/2022  Notes: Electronic Signature(s) Signed: 05/20/2022 4:20:37 PM By: Redmond Pulling RN, BSN Entered By: Redmond Pulling on 05/20/2022  15:23:44 -------------------------------------------------------------------------------- Pain Assessment Details Patient Name: Date of Service: Samuel [name] 05/20/2022 2:15 PM Medical Record Number: [id number] Patient Account Number: [id number] Date of Birth/Sex: Treating RN: 09-Apr-1970 (52 y.o. M) Primary Care Dynastie Knoop: Bertram Denver Other Clinician: Referring Jamyra Zweig: Treating Tyler Robidoux/Extender: Altamese Dilling Weeks in Treatment: 0 Active Problems Location of Pain Severity and Description of Pain Patient Has Paino No Site Locations Pain Management and Medication Current Pain Management: ANTRON, SETH ([id number]) [id number] Page 5 of 9 Electronic Signature(s) Signed: 05/20/2022 4:08:22 PM By: Thayer Dallas Entered By: Thayer Dallas on 05/20/2022 15:05:20 -------------------------------------------------------------------------------- Patient/Caregiver Education Details Patient Name: Date of Service: Samuel [name] 4/22/2024andnbsp2:15 PM Medical Record Number: [id number] Patient Account Number: [id number] Date of Birth/Gender: Treating RN: 27-Apr-1970 (52 y.o. Cline Cools Primary Care Physician: Bertram Denver Other Clinician: Referring Physician: Treating Physician/Extender: Benjamin Stain in Treatment: 0 Education Assessment Education Provided To: Patient Education Topics Provided Wound/Skin Impairment: Methods: Explain/Verbal Responses: State content correctly Electronic Signature(s) Signed: 05/20/2022 4:20:37 PM By: Redmond Pulling RN, BSN Entered By: Redmond Pulling on 05/20/2022 15:24:05 -------------------------------------------------------------------------------- Wound Assessment Details Patient Name: Date of Service: Samuel [name] 05/20/2022 2:15 PM Medical Record Number: [id number] Patient Account Number: [id number] Date of Birth/Sex: Treating  RN: February 22, 1970 (52 y.o. Cline Cools Primary Care Adlynn Lowenstein: Bertram Denver Other Clinician: Referring Rasheena Talmadge: Treating Rechy Bost/Extender: Altamese Dilling Weeks in Treatment: 0 Wound Status Wound Number: 7 Primary Venous Leg Ulcer Etiology: Wound Location: Right, Medial Lower Leg Wound Open Wounding Event: Gradually Appeared Status: Date Acquired: 11/12/2021 Comorbid Lymphedema, Asthma, Chronic Obstructive Pulmonary Disease Weeks Of Treatment: 0 History: (COPD), Hypertension, Peripheral Venous Disease, Confinement Clustered Wound: No Anxiety Photos Wound Measurements Length: (cm) 2.3 Width: (cm) 1.8 Samuel, [name] ([id number]) Depth: (cm) 0.2 Area: (cm) 3.252 Volume: (cm) 0.65 % Reduction in Area: 24.9% % Reduction in Volume: 49.9% [id number] Page 6 of 9 Epithelialization: None Tunneling: No Undermining: No Wound Description Classification: Full Thickness Without Exposed Support Structures Wound Margin: Distinct, outline attached Exudate Amount: Medium Exudate Type: Serosanguineous Exudate Color: red, brown Foul Odor After Cleansing: No Slough/Fibrino No Wound Bed Granulation Amount: Large (67-100%) Exposed Structure Granulation Quality: Red Fascia Exposed: No Necrotic Amount: Small (1-33%) Fat Layer (Subcutaneous Tissue) Exposed: No Necrotic Quality: Adherent Slough Tendon Exposed: No Muscle Exposed: No Joint Exposed: No Bone Exposed: No Periwound Skin Texture Texture Color No Abnormalities Noted: No No Abnormalities Noted: No Callus: No Atrophie Blanche: No Crepitus: No Cyanosis: No Excoriation: No Ecchymosis: No Induration: No Erythema: No Rash: No Hemosiderin Staining: No Scarring: Yes Mottled: No Pallor: No Moisture Rubor: No No Abnormalities Noted: No Dry / Scaly: No Maceration: No Treatment Notes Wound #7 (Lower Leg) Wound Laterality: Right, Medial Cleanser Soap and  Water Discharge Instruction: May shower and wash wound with dial antibacterial soap and water prior to dressing change. Vashe 5.8 (oz) Discharge Instruction: Cleanse the wound with Vashe prior to applying Samuel clean dressing using gauze sponges, not tissue or cotton balls. Peri-Wound Care Sween Lotion (Moisturizing lotion) Discharge Instruction: Apply moisturizing lotion as directed Topical Primary Dressing Hydrofera Blue Ready Transfer Foam, 2.5x2.5 (in/in) Discharge Instruction: Apply directly to wound bed as directed Secondary Dressing ABD Pad, 8x10 Discharge Instruction: Apply over primary dressing as directed. Secured With Compression Wrap Urgo K2 Lite, two layer compression system, regular Discharge Instruction:  Apply Urgo K2 Lite as directed (alternative to 3 layer compression). Compression Stockings Add-Ons Electronic Signature(s) Signed: 05/20/2022 4:08:22 PM By: Thayer Dallas Signed: 05/20/2022 4:20:37 PM By: Redmond Pulling RN, BSN Entered By: Thayer Dallas on 05/20/2022 15:14:35 Samuel [name] ([id number]) [id number] Page 7 of 9 -------------------------------------------------------------------------------- Wound Assessment Details Patient Name: Date of Service: Samuel [name] 05/20/2022 2:15 PM Medical Record Number: [id number] Patient Account Number: [id number] Date of Birth/Sex: Treating RN: 1970-10-28 (52 y.o. Cline Cools Primary Care Mohmmad Saleeby: Bertram Denver Other Clinician: Referring Krista Som: Treating Desha Bitner/Extender: Altamese Dilling Weeks in Treatment: 0 Wound Status Wound Number: 8 Primary Venous Leg Ulcer Etiology: Wound Location: Right, Dorsal Foot Wound Open Wounding Event: Gradually Appeared Status: Date Acquired: 03/15/2022 Comorbid Lymphedema, Asthma, Chronic Obstructive Pulmonary Disease Weeks Of Treatment: 0 History: (COPD), Hypertension, Peripheral Venous Disease,  Confinement Clustered Wound: No Anxiety Photos Wound Measurements Length: (cm) 0.2 Width: (cm) 0.4 Depth: (cm) 0.3 Area: (cm) 0.063 Volume: (cm) 0.019 % Reduction in Area: 70.3% % Reduction in Volume: 54.8% Epithelialization: None Tunneling: No Undermining: No Wound Description Classification: Full Thickness Without Exposed Support Wound Margin: Distinct, outline attached Exudate Amount: Medium Exudate Type: Serosanguineous Exudate Color: red, brown Structures Foul Odor After Cleansing: No Slough/Fibrino Yes Wound Bed Granulation Amount: Large (67-100%) Exposed Structure Granulation Quality: Pink Fascia Exposed: No Necrotic Amount: Small (1-33%) Fat Layer (Subcutaneous Tissue) Exposed: Yes Necrotic Quality: Adherent Slough Tendon Exposed: No Muscle Exposed: No Joint Exposed: No Bone Exposed: No Periwound Skin Texture Texture Color No Abnormalities Noted: No No Abnormalities Noted: No Callus: No Atrophie Blanche: No Crepitus: No Cyanosis: No Excoriation: No Ecchymosis: No Induration: No Erythema: No Rash: No Hemosiderin Staining: No Scarring: Yes Mottled: No Pallor: No Moisture Rubor: No No Abnormalities Noted: No Dry / Scaly: No Maceration: No Treatment Notes Wound #8 (Foot) Wound Laterality: Dorsal, Right Samuel, [name] ([id number]) [id number] Page 8 of 9 Cleanser Soap and Water Discharge Instruction: May shower and wash wound with dial antibacterial soap and water prior to dressing change. Vashe 5.8 (oz) Discharge Instruction: Cleanse the wound with Vashe prior to applying Samuel clean dressing using gauze sponges, not tissue or cotton balls. Peri-Wound Care Sween Lotion (Moisturizing lotion) Discharge Instruction: Apply moisturizing lotion as directed Topical Primary Dressing Hydrofera Blue Ready Transfer Foam, 2.5x2.5 (in/in) Discharge Instruction: Apply directly to wound bed as directed Secondary Dressing ABD Pad,  8x10 Discharge Instruction: Apply over primary dressing as directed. Secured With Compression Wrap Urgo K2 Lite, two layer compression system, regular Discharge Instruction: Apply Urgo K2 Lite as directed (alternative to 3 layer compression). Compression Stockings Add-Ons Electronic Signature(s) Signed: 05/20/2022 4:08:22 PM By: Thayer Dallas Signed: 05/20/2022 4:20:37 PM By: Redmond Pulling RN, BSN Entered By: Thayer Dallas on 05/20/2022 15:15:04 -------------------------------------------------------------------------------- Wound Assessment Details Patient Name: Date of Service: Samuel [name] 05/20/2022 2:15 PM Medical Record Number: [id number] Patient Account Number: [id number] Date of Birth/Sex: Treating RN: January 22, 1971 (52 y.o. Cline Cools Primary Care Samuel [name]: Bertram Denver Other Clinician: Referring Atreus Hasz: Treating Maty Zeisler/Extender: Altamese Dilling Weeks in Treatment: 0 Wound Status Wound Number: 9 Primary Venous Leg Ulcer Etiology: Wound Location: Left, Medial Lower Leg Wound Open Wounding Event: Gradually Appeared Status: Date Acquired: 04/13/2022 Comorbid Lymphedema, Asthma, Chronic Obstructive Pulmonary Disease Weeks Of Treatment: 0 History: (COPD), Hypertension, Peripheral Venous Disease, Confinement Clustered Wound: No Anxiety Photos Wound Measurements Length: (cm) Width: (cm) Depth: (cm) Area: (cm) Samuel, [name] ([id number]) Volume: (cm) 0 % Reduction in Area: 100%  0 % Reduction in Volume: 100% 0 Epithelialization: None 0 Tunneling: No [id number] Page 9 of 9 0 Undermining: No Wound Description Classification: Full Thickness Without Exposed Support Structures Wound Margin: Distinct, outline attached Exudate Amount: None Present Foul Odor After Cleansing: No Slough/Fibrino No Wound Bed Granulation Amount: None Present (0%) Exposed Structure Necrotic Amount: None Present  (0%) Fascia Exposed: No Fat Layer (Subcutaneous Tissue) Exposed: Yes Tendon Exposed: No Muscle Exposed: No Joint Exposed: No Bone Exposed: No Periwound Skin Texture Texture Color No Abnormalities Noted: No No Abnormalities Noted: No Callus: No Atrophie Blanche: No Crepitus: No Cyanosis: No Excoriation: No Ecchymosis: No Induration: No Erythema: No Rash: No Hemosiderin Staining: No Scarring: Yes Mottled: No Pallor: No Moisture Rubor: No No Abnormalities Noted: No Dry / Scaly: No Maceration: No Electronic Signature(s) Signed: 05/20/2022 4:08:22 PM By: Thayer Dallas Signed: 05/20/2022 4:20:37 PM By: Redmond Pulling RN, BSN Entered By: Thayer Dallas on 05/20/2022 15:15:43 -------------------------------------------------------------------------------- Vitals Details Patient Name: Date of Service: Samuel [name]. 05/20/2022 2:15 PM Medical Record Number: [id number] Patient Account Number: [id number] Date of Birth/Sex: Treating RN: Sep 17, 1970 (52 y.o. M) Primary Care Arlee Bossard: Bertram Denver Other Clinician: Referring Lucillie Kiesel: Treating Saadia Dewitt/Extender: Altamese Dilling Weeks in Treatment: 0 Vital Signs Time Taken: 15:04 Temperature (F): 98 Height (in): 70 Pulse (bpm): 101 Weight (lbs): 370 Respiratory Rate (breaths/min): 18 Body Mass Index (BMI): 53.1 Blood Pressure (mmHg): 155/90 Reference Range: 80 - 120 mg / dl Electronic Signature(s) Signed: 05/20/2022 4:08:22 PM By: Thayer Dallas Entered By: Thayer Dallas on 05/20/2022 15:05:09

## 2022-05-22 ENCOUNTER — Encounter: Payer: 59 | Attending: Surgery | Admitting: Dietician

## 2022-05-22 ENCOUNTER — Encounter: Payer: Self-pay | Admitting: Dietician

## 2022-05-22 ENCOUNTER — Ambulatory Visit: Payer: 59 | Attending: Physician Assistant | Admitting: Physician Assistant

## 2022-05-22 ENCOUNTER — Encounter: Payer: Self-pay | Admitting: Physician Assistant

## 2022-05-22 ENCOUNTER — Other Ambulatory Visit (HOSPITAL_COMMUNITY): Payer: Self-pay

## 2022-05-22 ENCOUNTER — Other Ambulatory Visit: Payer: Self-pay

## 2022-05-22 VITALS — BP 122/80 | HR 93 | Wt 378.0 lb

## 2022-05-22 DIAGNOSIS — J4521 Mild intermittent asthma with (acute) exacerbation: Secondary | ICD-10-CM

## 2022-05-22 DIAGNOSIS — I10 Essential (primary) hypertension: Secondary | ICD-10-CM | POA: Diagnosis not present

## 2022-05-22 DIAGNOSIS — N529 Male erectile dysfunction, unspecified: Secondary | ICD-10-CM

## 2022-05-22 DIAGNOSIS — Z125 Encounter for screening for malignant neoplasm of prostate: Secondary | ICD-10-CM

## 2022-05-22 DIAGNOSIS — R7303 Prediabetes: Secondary | ICD-10-CM | POA: Diagnosis not present

## 2022-05-22 DIAGNOSIS — R000 Tachycardia, unspecified: Secondary | ICD-10-CM | POA: Diagnosis not present

## 2022-05-22 MED ORDER — ALBUTEROL SULFATE HFA 108 (90 BASE) MCG/ACT IN AERS
2.0000 | INHALATION_SPRAY | Freq: Four times a day (QID) | RESPIRATORY_TRACT | 0 refills | Status: DC | PRN
Start: 2022-05-22 — End: 2022-08-30
  Filled 2022-05-22: qty 18, 68d supply, fill #0
  Filled 2022-06-10 – 2022-07-25 (×4): qty 6.7, 25d supply, fill #0

## 2022-05-22 MED ORDER — TRULICITY 1.5 MG/0.5ML ~~LOC~~ SOAJ
1.5000 mg | SUBCUTANEOUS | 3 refills | Status: DC
Start: 2022-05-22 — End: 2023-02-03
  Filled 2022-05-22: qty 2, 28d supply, fill #0
  Filled 2022-06-10: qty 6, 84d supply, fill #0
  Filled 2022-06-17: qty 2, 28d supply, fill #0
  Filled 2022-07-08 – 2022-12-20 (×5): qty 6, 84d supply, fill #0

## 2022-05-22 MED ORDER — SILDENAFIL CITRATE 100 MG PO TABS
100.0000 mg | ORAL_TABLET | ORAL | 1 refills | Status: DC | PRN
Start: 2022-05-22 — End: 2022-10-02
  Filled 2022-05-22 – 2022-07-25 (×5): qty 10, 30d supply, fill #0
  Filled 2022-08-30: qty 10, 30d supply, fill #1

## 2022-05-22 MED ORDER — LOSARTAN POTASSIUM-HCTZ 100-25 MG PO TABS
1.0000 | ORAL_TABLET | Freq: Every day | ORAL | 1 refills | Status: DC
Start: 2022-05-22 — End: 2023-02-03
  Filled 2022-05-22: qty 90, 90d supply, fill #0
  Filled 2022-06-10 – 2022-07-25 (×4): qty 30, 30d supply, fill #0
  Filled 2022-08-30: qty 30, 30d supply, fill #1
  Filled 2022-10-02: qty 30, 30d supply, fill #2
  Filled 2022-11-11: qty 30, 30d supply, fill #3
  Filled 2022-12-20: qty 30, 30d supply, fill #4

## 2022-05-22 MED ORDER — FLUTICASONE FUROATE-VILANTEROL 100-25 MCG/ACT IN AEPB
1.0000 | INHALATION_SPRAY | Freq: Every day | RESPIRATORY_TRACT | 6 refills | Status: DC
Start: 2022-05-22 — End: 2023-02-03
  Filled 2022-05-22: qty 60, 60d supply, fill #0
  Filled 2022-06-10 – 2022-07-25 (×3): qty 60, 30d supply, fill #0
  Filled 2022-08-30: qty 60, 30d supply, fill #1
  Filled 2022-10-02: qty 60, 30d supply, fill #2
  Filled 2022-11-11: qty 60, 30d supply, fill #3
  Filled 2022-12-20: qty 60, 30d supply, fill #4

## 2022-05-22 MED ORDER — METOPROLOL SUCCINATE ER 50 MG PO TB24
50.0000 mg | ORAL_TABLET | Freq: Every day | ORAL | 1 refills | Status: DC
Start: 2022-05-22 — End: 2023-02-03
  Filled 2022-05-22: qty 90, 90d supply, fill #0
  Filled 2022-06-10 – 2022-07-25 (×4): qty 30, 30d supply, fill #0
  Filled 2022-08-30: qty 30, 30d supply, fill #1
  Filled 2022-10-02: qty 30, 30d supply, fill #2
  Filled 2022-11-11: qty 30, 30d supply, fill #3
  Filled 2022-12-20: qty 30, 30d supply, fill #4

## 2022-05-22 NOTE — Patient Instructions (Signed)
eliminate sugar/refined carbohydrates.  Work at a goal of eliminating sugary drinks, candy, desserts, sweets, refined sugars, processed foods, and white carbohydrates.    Drink 80-100 ounces water

## 2022-05-22 NOTE — Progress Notes (Signed)
Patient ID: Samuel [name], male   DOB: 14-Oct-1970, 52 y.o.   MRN: [id number]   Samuel [name], is a 52 y.o. male  PPI:[id number]  AYT:[id number]  DOB - Jul 01, 1970  Chief Complaint  Patient presents with   Hernia       Subjective:   Samuel [name] is a 52 y.o. male here today for med RF.  He wants to see a urologist for ED and for testosterone checking.  He is not fasting today and ate fast food for breakfast And lunch.  He has been out of trulicity for 2 weeks.  Prior to that he was compliant weekly.  He saw surgery and they are planning an elective repair of his umbilical hernia but they want him to lose weight first. He is meeting with a nutritionist to discuss a nutrition and exercise plan.    No problems updated.  ALLERGIES: Allergies  Allergen Reactions   Shrimp [Shellfish Allergy]     Lip swell up.     PAST MEDICAL HISTORY: Past Medical History:  Diagnosis Date   Asthma    Hypertension    Prediabetes    Ulcer    venous insufficiency    MEDICATIONS AT HOME: Prior to Admission medications   Medication Sig Start Date End Date Taking? Authorizing Provider  ipratropium-albuterol (DUONEB) 0.5-2.5 (3) MG/3ML SOLN TAKE 3 MLS BY NEBULIZATION EVERY 6 (SIX) HOURS AS NEEDED FOR SHORTNESS OF BREATH 02/07/22  Yes Claiborne Rigg, NP  albuterol (PROVENTIL HFA) 108 (90 Base) MCG/ACT inhaler Inhale 2 puffs into the lungs every 6 (six) hours as needed for wheezing or shortness of breath. 05/22/22   Anders Simmonds, PA-C  Dulaglutide (TRULICITY) 1.5 MG/0.5ML SOPN Inject 1.5 mg into the skin once a week. 05/22/22   Anders Simmonds, PA-C  fluticasone furoate-vilanterol (BREO ELLIPTA) 100-25 MCG/ACT AEPB Inhale 1 puff into the lungs daily. 05/22/22   Anders Simmonds, PA-C  losartan-hydrochlorothiazide (HYZAAR) 100-25 MG tablet Take 1 tablet by mouth daily. 05/22/22   Anders Simmonds, PA-C  metoprolol succinate (TOPROL-XL) 50 MG 24 hr tablet Take 1 tablet (50 mg total) by mouth  daily. 05/22/22   Anders Simmonds, PA-C  sildenafil (VIAGRA) 100 MG tablet Take 1 tablet (100 mg total) by mouth as needed for erectile dysfunction. 05/22/22 05/22/23  Anders Simmonds, PA-C    ROS: Neg HEENT Neg resp Neg cardiac Neg GI Neg GU Neg MS Neg psych Neg neuro  Objective:   Vitals:   05/22/22 1530  BP: 122/80  Pulse: 93  SpO2: 100%  Weight: (!) 378 lb (171.5 kg)   Exam General appearance : Awake, alert, not in any distress. Speech Clear. Not toxic looking; morbidly obese HEENT: Atraumatic and Normocephalic Neck: Supple, no JVD. No cervical lymphadenopathy.  Chest: Good air entry bilaterally, CTAB.  No rales/rhonchi/wheezing CVS: S1 S2 regular, no murmurs.  Extremities: B/L Lower Ext shows no edema, both legs are warm to touch Neurology: Awake alert, and oriented X 3, CN II-XII intact, Non focal Skin: No Rash  Data Review Lab Results  Component Value Date   HGBA1C 5.8 01/09/2022   HGBA1C 5.9 (H) 01/31/2021   HGBA1C 5.7 05/25/2020    Assessment & Plan   1. Prediabetes I have had a lengthy discussion and provided education about insulin resistance and the intake of too much sugar/refined carbohydrates.  I have advised the patient to work at a goal of eliminating sugary drinks, candy, desserts, sweets, refined sugars, processed foods, and [place]  carbohydrates.  The patient expresses understanding.  - Hemoglobin A1c - Dulaglutide (TRULICITY) 1.5 MG/0.5ML SOPN; Inject 1.5 mg into the skin once a week.  Dispense: 6 mL; Refill: 3 - losartan-hydrochlorothiazide (HYZAAR) 100-25 MG tablet; Take 1 tablet by mouth daily.  Dispense: 90 tablet; Refill: 1 - metoprolol succinate (TOPROL-XL) 50 MG 24 hr tablet; Take 1 tablet (50 mg total) by mouth daily.  Dispense: 90 tablet; Refill: 1 - Comprehensive metabolic panel  2. Mild intermittent asthma with acute exacerbation - fluticasone furoate-vilanterol (BREO ELLIPTA) 100-25 MCG/ACT AEPB; Inhale 1 puff into the lungs daily.   Dispense: 60 each; Refill: 6 - albuterol (PROVENTIL HFA) 108 (90 Base) MCG/ACT inhaler; Inhale 2 puffs into the lungs every 6 (six) hours as needed for wheezing or shortness of breath.  Dispense: 18 g; Refill: 0  3. Essential hypertension controlled - losartan-hydrochlorothiazide (HYZAAR) 100-25 MG tablet; Take 1 tablet by mouth daily.  Dispense: 90 tablet; Refill: 1 - metoprolol succinate (TOPROL-XL) 50 MG 24 hr tablet; Take 1 tablet (50 mg total) by mouth daily.  Dispense: 90 tablet; Refill: 1 - Comprehensive metabolic panel  4. Tachycardia Stable on meds - metoprolol succinate (TOPROL-XL) 50 MG 24 hr tablet; Take 1 tablet (50 mg total) by mouth daily.  Dispense: 90 tablet; Refill: 1  5. Erectile dysfunction, unspecified erectile dysfunction type - sildenafil (VIAGRA) 100 MG tablet; Take 1 tablet (100 mg total) by mouth as needed for erectile dysfunction.  Dispense: 10 tablet; Refill: 1 - Ambulatory referral to Urology  6. Prostate cancer screening - PSA  Not fasting-did not check lipids  Return in about 6 months (around 11/21/2022) for PCP for chronic conditions.  The patient was given clear instructions to go to ER or return to medical center if symptoms don't improve, worsen or new problems develop. The patient verbalized understanding. The patient was told to call to get lab results if they haven't heard anything in the next week.      Georgian Co, PA-C [facility] [place], [place] [phone]   05/22/2022, 3:57 PM

## 2022-05-22 NOTE — Progress Notes (Signed)
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 Pt works from [employer].  Pt states he is lactose intolerant. Pt states he stays away from cheese but drinks milk. Pt states he goes to [employer], [employer], and [employer]. Pt states when thirsty he reaches for soda or iced tea. Pt reports he drinks around 8 cans of mountain dew daily. Pt reports he drinks 2-3 water bottles weekly.  Pt states he barley cooks at home. Pt states he will sometimes eat snacks in bed at night.   Estimated daily fluid intake: Pt states he drinks 2-3 bottles of water per week.  Supplements: not assessed Sleep: restless sleep- sleeps with TV on  Stress / self-care: manageable- daily stressors Current average weekly physical activity: ADL's/ work   24-Hr Dietary Recall First Meal: skipped (typical) Snack: peanut butter nabs and orange juice  Second Meal: baked chicken, rice, pinto beans Snack: none Third Meal: subway sub (roast beef, lettuce tomato, onion, mayo) lays chips Snack: cake/candy OR apple  Beverages: Orange juice, mountain dew (8 cans) , iced tea (sweetened with lemonade)    NUTRITION DIAGNOSIS  NB-1.1 Food and nutrition-related knowledge deficit As related to lack of prior nutrition education by a nutrition professional.  As evidenced by pt report.   NUTRITION INTERVENTION  Nutrition education (E-1) on the following topics:  MyPlate- building balanced meals/snacks   Hydration- importance of drinking 64+ oz water daily and reducing sugar sweetened beverages  Benefits of physical activity on health  Mindfulness around eating- listening to your hunger cues and understanding food triggers Impact of sugar sweetened beverages on diabetes risk   Handouts Provided Include  My Plate  Snack Ideas Diabetes Portion Plate  Learning Style & Readiness for Change Teaching method utilized: Visual & Auditory  Demonstrated degree of understanding via: Teach Back  Barriers to learning/adherence to lifestyle change: none  Goals Established by Pt Goal 1: aim to drink 5-6 16 oz bottles of water daily Goal 2: aim to drink 4 cans or less of soda per day  Goal 3: go walking 2-3x/wk for at least 30 minutes   MONITORING & EVALUATION Dietary intake, weekly physical activity, and follow in 3 months.  Next Steps  Patient is to call for questions.

## 2022-05-23 ENCOUNTER — Other Ambulatory Visit: Payer: Self-pay

## 2022-05-23 LAB — COMPREHENSIVE METABOLIC PANEL
ALT: 15 IU/L (ref 0–44)
AST: 14 IU/L (ref 0–40)
Albumin/Globulin Ratio: 1.4 (ref 1.2–2.2)
Albumin: 4.2 g/dL (ref 4.1–5.1)
Alkaline Phosphatase: 82 IU/L (ref 44–121)
BUN/Creatinine Ratio: 11 (ref 9–20)
BUN: 12 mg/dL (ref 6–24)
Bilirubin Total: 0.3 mg/dL (ref 0.0–1.2)
CO2: 23 mmol/L (ref 20–29)
Calcium: 9.4 mg/dL (ref 8.7–10.2)
Chloride: 100 mmol/L (ref 96–106)
Creatinine, Ser: 1.07 mg/dL (ref 0.76–1.27)
Globulin, Total: 3.1 g/dL (ref 1.5–4.5)
Glucose: 104 mg/dL — ABNORMAL HIGH (ref 70–99)
Potassium: 4 mmol/L (ref 3.5–5.2)
Sodium: 138 mmol/L (ref 134–144)
Total Protein: 7.3 g/dL (ref 6.0–8.5)
eGFR: 85 mL/min/{1.73_m2} (ref >59)

## 2022-05-23 LAB — HEMOGLOBIN A1C
Est. average glucose Bld gHb Est-mCnc: 123 mg/dL
Hgb A1c MFr Bld: 5.9 % — ABNORMAL HIGH (ref 4.8–5.6)

## 2022-05-23 LAB — PSA: Prostate Specific Ag, Serum: 0.5 ng/mL (ref 0.0–4.0)

## 2022-05-27 ENCOUNTER — Encounter (HOSPITAL_BASED_OUTPATIENT_CLINIC_OR_DEPARTMENT_OTHER): Payer: 59 | Admitting: Internal Medicine

## 2022-05-28 ENCOUNTER — Ambulatory Visit (HOSPITAL_BASED_OUTPATIENT_CLINIC_OR_DEPARTMENT_OTHER)
Admission: RE | Admit: 2022-05-28 | Discharge: 2022-05-28 | Disposition: A | Discharge status: Home / Self Care | Payer: 59 | Source: Ambulatory Visit | Attending: Surgery | Admitting: Surgery

## 2022-05-28 DIAGNOSIS — R1011 Right upper quadrant pain: Secondary | ICD-10-CM | POA: Insufficient documentation

## 2022-05-28 DIAGNOSIS — K760 Fatty (change of) liver, not elsewhere classified: Secondary | ICD-10-CM | POA: Diagnosis not present

## 2022-05-28 DIAGNOSIS — K429 Umbilical hernia without obstruction or gangrene: Secondary | ICD-10-CM | POA: Diagnosis not present

## 2022-05-28 MED ORDER — IOHEXOL 300 MG/ML  SOLN
100.0000 mL | Freq: Once | INTRAMUSCULAR | Status: AC | PRN
  Administered 2022-05-28: 100 mL via INTRAVENOUS

## 2022-05-29 ENCOUNTER — Encounter: Payer: Self-pay | Admitting: Urology

## 2022-05-29 ENCOUNTER — Ambulatory Visit: Payer: 59 | Admitting: Urology

## 2022-05-29 VITALS — BP 128/87 | HR 106 | Ht 70.0 in | Wt 377.0 lb

## 2022-05-29 DIAGNOSIS — N529 Male erectile dysfunction, unspecified: Secondary | ICD-10-CM | POA: Diagnosis not present

## 2022-05-29 NOTE — Progress Notes (Signed)
Assessment: 1. Erectile dysfunction of organic origin      Plan: Today I had a long discussion with the patient regarding ED management options using a goal oriented approach.  We discussed standard treatment options including vacuum erection devices, oral medications, penile injection therapy, as well as surgical implants. Following our discussion the patient is going to try using Viagra and understands the importance of using it properly by taking it on an empty stomach 1 to 2 hours prior to sexual activity.  I also gave him educational material as well as ordering information for a vacuum erection device.  He will follow-up as needed.  Chief Complaint: ED  History of Present Illness:  Samuel [name] is a 52 y.o. male who is seen in consultation from Claiborne Rigg, NP for evaluation of ED. Patient has a past medical history of hypertension and diabetes. He reports a several year history of erectile dysfunction.  He states that with sexual stimulation he gets a 1/2-2/3 erection but often loses it prior to intercourse.  He does not think Viagra has helped.  I did ask him if he took it appropriately on an empty stomach and he was unaware of that and states that he typically took it right after dinner.  He reports a normal libido and no other hypogonadal symptoms.  He denies lower urinary tract symptoms.   Past Medical History:  Past Medical History:  Diagnosis Date   Asthma    Hypertension    Prediabetes    Ulcer    venous insufficiency    Past Surgical History:  History reviewed. No pertinent surgical history.  Allergies:  Allergies  Allergen Reactions   Shrimp [Shellfish Allergy]     Lip swell up.     Family History:  Family History  Problem Relation Age of Onset   Asthma Mother    Hypertension Mother     Social History:  Social History   Tobacco Use   Smoking status: Former    Packs/day: 1.00    Years: 15.00    Additional pack years: 0.00    Total  pack years: 15.00    Types: Cigarettes    Quit date: 10/28/2012    Years since quitting: 9.5   Smokeless tobacco: Never  Vaping Use   Vaping Use: Never used  Substance Use Topics   Alcohol use: Yes    Alcohol/week: 4.0 standard drinks of alcohol    Types: 2 Cans of beer, 2 Shots of liquor per week   Drug use: No    Review of symptoms:  Constitutional:  Negative for unexplained weight loss, night sweats, fever, chills ENT:  Negative for nose bleeds, sinus pain, painful swallowing CV:  Negative for chest pain, shortness of breath, exercise intolerance, palpitations, loss of consciousness Resp:  Negative for cough, wheezing, shortness of breath GI:  Negative for nausea, vomiting, diarrhea, bloody stools GU:  Positives noted in HPI; otherwise negative for gross hematuria, dysuria, urinary incontinence Neuro:  Negative for seizures, poor balance, limb weakness, slurred speech Psych:  Negative for lack of energy, depression, anxiety Endocrine:  Negative for polydipsia, polyuria, symptoms of hypoglycemia (dizziness, hunger, sweating) Hematologic:  Negative for anemia, purpura, petechia, prolonged or excessive bleeding, use of anticoagulants  Allergic:  Negative for difficulty breathing or choking as a result of exposure to anything; no shellfish allergy; no allergic response (rash/itch) to materials, foods  Physical exam: BP 128/87   Pulse (!) 106   Ht 5\' 10"  (1.778 m)  Wt (!) 377 lb (171 kg)   BMI 54.09 kg/m  GENERAL APPEARANCE:  Well appearing, well developed, well nourished, NAD

## 2022-05-31 ENCOUNTER — Encounter (HOSPITAL_BASED_OUTPATIENT_CLINIC_OR_DEPARTMENT_OTHER): Payer: 59 | Attending: Internal Medicine | Admitting: Internal Medicine

## 2022-05-31 DIAGNOSIS — L97812 Non-pressure chronic ulcer of other part of right lower leg with fat layer exposed: Secondary | ICD-10-CM | POA: Diagnosis not present

## 2022-05-31 DIAGNOSIS — I87313 Chronic venous hypertension (idiopathic) with ulcer of bilateral lower extremity: Secondary | ICD-10-CM | POA: Insufficient documentation

## 2022-05-31 DIAGNOSIS — G4733 Obstructive sleep apnea (adult) (pediatric): Secondary | ICD-10-CM | POA: Insufficient documentation

## 2022-05-31 DIAGNOSIS — L97822 Non-pressure chronic ulcer of other part of left lower leg with fat layer exposed: Secondary | ICD-10-CM | POA: Insufficient documentation

## 2022-05-31 DIAGNOSIS — J4489 Other specified chronic obstructive pulmonary disease: Secondary | ICD-10-CM | POA: Diagnosis not present

## 2022-06-01 NOTE — Progress Notes (Signed)
Samuel [name], Samuel [name] ([id number]) [id number] Page 1 of 7 Visit Report for 05/31/2022 Chief Complaint Document Details Patient Name: Date of Service: Samuel [name] 05/31/2022 8:45 Samuel M Medical Record Number: [id number] Patient Account Number: [id number] Date of Birth/Sex: Treating RN: [name] (52 y.o. M) Primary Care Provider: Bertram Denver Other Clinician: Referring Provider: Treating Provider/Extender: Altamese Dilling Weeks in Treatment: 2 Information Obtained from: Patient Chief Complaint patient is here for review of wounds on his right medial lower leg and right lateral foot 06/27/17; patient returns to clinic today with Samuel reopening on his bilateral medial lower calfs 05/14/2022; bilateral lower extremity wounds Electronic Signature(s) Signed: 05/31/2022 10:08:01 AM By: Geralyn Corwin DO Entered By: Geralyn Corwin on 05/31/2022 09:24:22 -------------------------------------------------------------------------------- HPI Details Patient Name: Date of Service: Samuel Starch G. 05/31/2022 8:45 Samuel M Medical Record Number: [id number] Patient Account Number: [id number] Date of Birth/Sex: Treating RN: [birth year][name] (52 y.o. M) Primary Care Provider: Bertram Denver Other Clinician: Referring Provider: Treating Provider/Extender: Altamese Dilling Weeks in Treatment: 2 History of Present Illness HPI Description: 05/14/2022 Samuel [name] is Samuel 52 year old male with Samuel past medical history of venous insufficiency, COPD and OSA that presents to the clinic for Samuel 32-month history of nonhealing ulcers to his legs bilaterally. He has compression stockings but has not been wearing them. He has been keeping the areas covered. He states he has Samuel history of wounds that wax and wane in healing. He currently denies signs of infection. 4/22; patient presents for follow-up. The wraps had slid down over the past week. We have been  using Hydrofera Blue under 3 layer compression to the lower extremities bilaterally. Adding this he has no issues or complaints. Overall the wounds are smaller. The left lower extremity wounds have healed. 5/3; patient presents for follow-up. He missed his last clinic appointment and has had the wrap on for 2 weeks. He knows to not keep this on for more than Samuel week. We have been using Hydrofera Blue under 3 layer compression. The right lateral foot wound has healed. Electronic Signature(s) Signed: 05/31/2022 10:08:01 AM By: Geralyn Corwin DO Entered By: Geralyn Corwin on 05/31/2022 09:33:36 -------------------------------------------------------------------------------- Physical Exam Details Patient Name: Date of Service: Samuel [name]. 05/31/2022 8:45 Samuel M Medical Record Number: [id number] Patient Account Number: [id number] Date of Birth/Sex: Treating RN: Samuel [name] (52 y.o. M) Primary Care Provider: Bertram Denver Other Clinician: Referring Provider: Treating Provider/Extender: Altamese Dilling Weeks in Treatment: 2 Constitutional respirations regular, non-labored and within target range for patient.. Cardiovascular 2+ dorsalis pedis/posterior tibialis pulses. Samuel [name] ([id number]) [id number] Page 2 of 7 Psychiatric pleasant and cooperative. Notes Right lower extremity: T the anterior aspect of the leg there is an open wound with granulation tissue. Epithelization to the previous wound on the lateral right o foot. Good edema control. Significant hemosiderin staining noted throughout the legs. No surrounding signs of infection. Electronic Signature(s) Signed: 05/31/2022 10:08:01 AM By: Geralyn Corwin DO Entered By: Geralyn Corwin on 05/31/2022 09:29:31 -------------------------------------------------------------------------------- Physician Orders Details Patient Name: Date of Service: Samuel [name] 05/31/2022  8:45 Samuel M Medical Record Number: [id number] Patient Account Number: [id number] Date of Birth/Sex: Treating RN: Samuel [name] (52 y.o. Samuel [name] Primary Care Provider: Bertram Denver Other Clinician: Referring Provider: Treating Provider/Extender: Altamese Dilling Weeks in Treatment: 2 Verbal / Phone Orders: No Diagnosis Coding ICD-10 Coding Code Description [phone] Non-pressure chronic ulcer of other part of right  lower leg with fat layer exposed L97.822 Non-pressure chronic ulcer of other part of left lower leg with fat layer exposed I87.313 Chronic venous hypertension (idiopathic) with ulcer of bilateral lower extremity J44.89 Other specified chronic obstructive pulmonary disease Follow-up Appointments ppointment in 1 week. - Dr. Mikey Bussing Monday 06/10/2022 1100 room 8 Return Samuel ***CANCEL MONDAY APPT 06/03/2022 AT 0800**** ppointment in 2 weeks. - Dr. Mikey Bussing Monday Return Samuel Anesthetic (In clinic) Topical Lidocaine 4% applied to wound bed Bathing/ Shower/ Hygiene May shower with protection but do not get wound dressing(s) wet. Protect dressing(s) with water repellant cover (for example, large plastic bag) or Samuel cast cover and may then take shower. Edema Control - Lymphedema / SCD / Other Elevate legs to the level of the heart or above for 30 minutes daily and/or when sitting for 3-4 times Samuel day throughout the day. Samuel void standing for long periods of time. Exercise regularly If compression wraps slide down please call wound center and speak with Samuel nurse. Wound Treatment Wound #7 - Lower Leg Wound Laterality: Right, Medial Cleanser: Soap and Water 1 x Per Week/30 Days Discharge Instructions: May shower and wash wound with dial antibacterial soap and water prior to dressing change. Cleanser: Vashe 5.8 (oz) 1 x Per Week/30 Days Discharge Instructions: Cleanse the wound with Vashe prior to applying Samuel clean dressing using gauze sponges, not tissue or cotton  balls. Peri-Wound Care: Sween Lotion (Moisturizing lotion) 1 x Per Week/30 Days Discharge Instructions: Apply moisturizing lotion as directed Prim Dressing: Hydrofera Blue Ready Transfer Foam, 2.5x2.5 (in/in) 1 x Per Week/30 Days ary Discharge Instructions: Apply directly to wound bed as directed Secondary Dressing: ABD Pad, 8x10 1 x Per Week/30 Days Discharge Instructions: Apply over primary dressing as directed. Compression Wrap: Urgo K2 Lite, two layer compression system, regular 1 x Per Week/30 Days Discharge Instructions: Apply Urgo K2 Lite as directed (alternative to 3 layer compression). Electronic Signature(s) Signed: 05/31/2022 10:08:01 AM By: Barrington Ellison ([id number]) Geralyn Corwin DO [phone].pdf Page 3 of 7 Signed: 05/31/2022 10:08:01 AM By: Margaret Pyle By: Geralyn Corwin on 05/31/2022 09:29:38 -------------------------------------------------------------------------------- Problem List Details Patient Name: Date of Service: Samuel [name] 05/31/2022 8:45 Samuel M Medical Record Number: [id number] Patient Account Number: [id number] Date of Birth/Sex: Treating RN: [birth year][name] (52 y.o. Harlon Flor, Millard.Loa Primary Care Provider: Bertram Denver Other Clinician: Referring Provider: Treating Provider/Extender: Altamese Dilling Weeks in Treatment: 2 Active Problems ICD-10 Encounter Code Description Active Date MDM Diagnosis L97.812 Non-pressure chronic ulcer of other part of right lower leg with fat layer 05/14/2022 No Yes exposed L97.822 Non-pressure chronic ulcer of other part of left lower leg with fat layer exposed4/16/2024 No Yes I87.313 Chronic venous hypertension (idiopathic) with ulcer of bilateral lower extremity 05/14/2022 No Yes J44.89 Other specified chronic obstructive pulmonary disease 05/14/2022 No Yes Inactive Problems Resolved Problems Electronic Signature(s) Signed: 05/31/2022 10:08:01 AM By:  Geralyn Corwin DO Entered By: Geralyn Corwin on 05/31/2022 09:24:05 -------------------------------------------------------------------------------- Progress Note Details Patient Name: Date of Service: Samuel Starch G. 05/31/2022 8:45 Samuel M Medical Record Number: [id number] Patient Account Number: [id number] Date of Birth/Sex: Treating RN: [name] (52 y.o. M) Primary Care Provider: Bertram Denver Other Clinician: Referring Provider: Treating Provider/Extender: Altamese Dilling Weeks in Treatment: 2 Subjective Chief Complaint Information obtained from Patient patient is here for review of wounds on his right medial lower leg and right lateral foot 06/27/17; patient returns to clinic today with Samuel reopening on his  bilateral medial lower calfs 05/14/2022; bilateral lower extremity wounds History of Present Illness (HPI) 05/14/2022 Mr. Edwing Miyata is Samuel 52 year old male with Samuel past medical history of venous insufficiency, COPD and OSA that presents to the clinic for Samuel 81-month history of nonhealing ulcers to his legs bilaterally. He has compression stockings but has not been wearing them. He has been keeping the areas covered. He states he has Samuel history of wounds that wax and wane in healing. He currently denies signs of infection. 4/22; patient presents for follow-up. The wraps had slid down over the past week. We have been using Hydrofera Blue under 3 layer compression to the lower extremities bilaterally. Adding this he has no issues or complaints. Overall the wounds are smaller. The left lower extremity wounds have healed. 5/3; patient presents for follow-up. He missed his last clinic appointment and has had the wrap on for 2 weeks. He knows to not keep this on for more than Samuel week. We have been using Hydrofera Blue under 3 layer compression. The right lateral foot wound has healed. FAIN, PINN ([id number]) [id number] Page 4 of  7 Patient History Information obtained from Patient. Family History Hypertension - Mother, Lung Disease - Mother, No family history of Cancer, Diabetes, Heart Disease, Hereditary Spherocytosis, Kidney Disease, Seizures, Stroke, Thyroid Problems, Tuberculosis. Social History Former smoker - quit 11 yrs ago, Marital Status - Single, Alcohol Use - Rarely, Drug Use - Current History - TCH, Caffeine Use - Moderate - coffee. Medical History Eyes Denies history of Cataracts, Glaucoma, Optic Neuritis Ear/Nose/Mouth/Throat Denies history of Chronic sinus problems/congestion, Middle ear problems Hematologic/Lymphatic Patient has history of Lymphedema Denies history of Anemia, Hemophilia, Human Immunodeficiency Virus, Sickle Cell Disease Respiratory Patient has history of Asthma, Chronic Obstructive Pulmonary Disease (COPD) Denies history of Aspiration, Pneumothorax, Sleep Apnea, Tuberculosis Cardiovascular Patient has history of Hypertension, Peripheral Venous Disease Denies history of Angina, Arrhythmia, Congestive Heart Failure, Coronary Artery Disease, Deep Vein Thrombosis, Hypotension, Myocardial Infarction, Peripheral Arterial Disease, Phlebitis, Vasculitis Gastrointestinal Denies history of Cirrhosis , Colitis, Crohnoos, Hepatitis Samuel, Hepatitis B, Hepatitis C Endocrine Denies history of Type I Diabetes, Type II Diabetes Genitourinary Denies history of End Stage Renal Disease Immunological Denies history of Lupus Erythematosus, Raynaudoos, Scleroderma Integumentary (Skin) Denies history of History of Burn Musculoskeletal Denies history of Gout, Rheumatoid Arthritis, Osteoarthritis, Osteomyelitis Neurologic Denies history of Dementia, Neuropathy, Quadriplegia, Paraplegia, Seizure Disorder Oncologic Denies history of Received Chemotherapy, Received Radiation Psychiatric Patient has history of Confinement Anxiety Denies history of Anorexia/bulimia Medical Samuel Surgical History  Notes nd Constitutional Symptoms (General Health) morbid obesity Objective Constitutional respirations regular, non-labored and within target range for patient.. Vitals Time Taken: 8:55 AM, Height: 70 in, Weight: 370 lbs, BMI: 53.1, Temperature: 98.6 F, Pulse: 99 bpm, Respiratory Rate: 20 breaths/min, Blood Pressure: 178/99 mmHg. Cardiovascular 2+ dorsalis pedis/posterior tibialis pulses. Psychiatric pleasant and cooperative. General Notes: Right lower extremity: T the anterior aspect of the leg there is an open wound with granulation tissue. Epithelization to the previous wound on o the lateral right foot. Good edema control. Significant hemosiderin staining noted throughout the legs. No surrounding signs of infection. Integumentary (Hair, Skin) Wound #7 status is Open. Original cause of wound was Gradually Appeared. The date acquired was: 11/12/2021. The wound has been in treatment 2 weeks. The wound is located on the Right,Medial Lower Leg. The wound measures 2.5cm length x 1.7cm width x 0.1cm depth; 3.338cm^2 area and 0.334cm^3 volume. There is Fat Layer (Subcutaneous Tissue) exposed. There is no tunneling  or undermining noted. There is Samuel medium amount of serosanguineous drainage noted. The wound margin is distinct with the outline attached to the wound base. There is large (67-100%) red granulation within the wound bed. There is no necrotic tissue within the wound bed. The periwound skin appearance exhibited: Scarring. The periwound skin appearance did not exhibit: Callus, Crepitus, Excoriation, Induration, Rash, Dry/Scaly, Maceration, Atrophie Blanche, Cyanosis, Ecchymosis, Hemosiderin Staining, Mottled, Pallor, Rubor, Erythema. Wound #8 status is Healed - Epithelialized. Original cause of wound was Gradually Appeared. The date acquired was: 03/15/2022. The wound has been in treatment 2 weeks. The wound is located on the Right,Dorsal Foot. The wound measures 0cm length x 0cm width x  0cm depth; 0cm^2 area and 0cm^3 volume. There is no tunneling or undermining noted. There is Samuel none present amount of drainage noted. The wound margin is distinct with the outline attached to the CUONG, SOLOFF ([id number]) [phone].pdf Page 5 of 7 wound base. There is no granulation within the wound bed. There is no necrotic tissue within the wound bed. The periwound skin appearance did not exhibit: Callus, Crepitus, Excoriation, Induration, Rash, Scarring, Dry/Scaly, Maceration, Atrophie Blanche, Cyanosis, Ecchymosis, Hemosiderin Staining, Mottled, Pallor, Rubor, Erythema. Assessment Active Problems ICD-10 Non-pressure chronic ulcer of other part of right lower leg with fat layer exposed Non-pressure chronic ulcer of other part of left lower leg with fat layer exposed Chronic venous hypertension (idiopathic) with ulcer of bilateral lower extremity Other specified chronic obstructive pulmonary disease Patient's right foot wound has healed. He has 1 wound remaining to the right distal lateral aspect with healthy granulation tissue present. This area appears well-healing. I recommended continuing with Hydrofera Blue and 3 layer compression. Procedures Wound #7 Pre-procedure diagnosis of Wound #7 is Samuel Venous Leg Ulcer located on the Right,Medial Lower Leg . There was Samuel Double Layer Compression Therapy Procedure by Shawn Stall, RN. Post procedure Diagnosis Wound #7: Same as Pre-Procedure Plan Follow-up Appointments: Return Appointment in 1 week. - Dr. Mikey Bussing Monday 06/10/2022 1100 room 8 ***CANCEL MONDAY APPT 06/03/2022 AT 0800**** Return Appointment in 2 weeks. - Dr. Mikey Bussing Monday Anesthetic: (In clinic) Topical Lidocaine 4% applied to wound bed Bathing/ Shower/ Hygiene: May shower with protection but do not get wound dressing(s) wet. Protect dressing(s) with water repellant cover (for example, large plastic bag) or Samuel cast cover and may then take  shower. Edema Control - Lymphedema / SCD / Other: Elevate legs to the level of the heart or above for 30 minutes daily and/or when sitting for 3-4 times Samuel day throughout the day. Avoid standing for long periods of time. Exercise regularly If compression wraps slide down please call wound center and speak with Samuel nurse. WOUND #7: - Lower Leg Wound Laterality: Right, Medial Cleanser: Soap and Water 1 x Per Week/30 Days Discharge Instructions: May shower and wash wound with dial antibacterial soap and water prior to dressing change. Cleanser: Vashe 5.8 (oz) 1 x Per Week/30 Days Discharge Instructions: Cleanse the wound with Vashe prior to applying Samuel clean dressing using gauze sponges, not tissue or cotton balls. Peri-Wound Care: Sween Lotion (Moisturizing lotion) 1 x Per Week/30 Days Discharge Instructions: Apply moisturizing lotion as directed Prim Dressing: Hydrofera Blue Ready Transfer Foam, 2.5x2.5 (in/in) 1 x Per Week/30 Days ary Discharge Instructions: Apply directly to wound bed as directed Secondary Dressing: ABD Pad, 8x10 1 x Per Week/30 Days Discharge Instructions: Apply over primary dressing as directed. Com pression Wrap: Urgo K2 Lite, two layer compression system, regular 1 x  Per Week/30 Days Discharge Instructions: Apply Urgo K2 Lite as directed (alternative to 3 layer compression). 1. Hydrofera Blue under 3 layer compressionooright lower extremity 2. Follow-up in 1 week Electronic Signature(s) Signed: 05/31/2022 10:08:01 AM By: Geralyn Corwin DO Entered By: Geralyn Corwin on 05/31/2022 09:36:10 -------------------------------------------------------------------------------- HxROS Details Patient Name: Date of Service: Samuel [name]. 05/31/2022 8:45 Samuel M Medical Record Number: [id number] Patient Account Number: [id number] DARYLL, DOTO ([id number]) [phone].pdf Page 6 of 7 Date of Birth/Sex: Treating RN: [birth year][name] (52 y.o. M) Primary  Care Provider: Bertram Denver Other Clinician: Referring Provider: Treating Provider/Extender: Altamese Dilling Weeks in Treatment: 2 Information Obtained From Patient Constitutional Symptoms (General Health) Medical History: Past Medical History Notes: morbid obesity Eyes Medical History: Negative for: Cataracts; Glaucoma; Optic Neuritis Ear/Nose/Mouth/Throat Medical History: Negative for: Chronic sinus problems/congestion; Middle ear problems Hematologic/Lymphatic Medical History: Positive for: Lymphedema Negative for: Anemia; Hemophilia; Human Immunodeficiency Virus; Sickle Cell Disease Respiratory Medical History: Positive for: Asthma; Chronic Obstructive Pulmonary Disease (COPD) Negative for: Aspiration; Pneumothorax; Sleep Apnea; Tuberculosis Cardiovascular Medical History: Positive for: Hypertension; Peripheral Venous Disease Negative for: Angina; Arrhythmia; Congestive Heart Failure; Coronary Artery Disease; Deep Vein Thrombosis; Hypotension; Myocardial Infarction; Peripheral Arterial Disease; Phlebitis; Vasculitis Gastrointestinal Medical History: Negative for: Cirrhosis ; Colitis; Crohns; Hepatitis Samuel; Hepatitis B; Hepatitis C Endocrine Medical History: Negative for: Type I Diabetes; Type II Diabetes Genitourinary Medical History: Negative for: End Stage Renal Disease Immunological Medical History: Negative for: Lupus Erythematosus; Raynauds; Scleroderma Integumentary (Skin) Medical History: Negative for: History of Burn Musculoskeletal Medical History: Negative for: Gout; Rheumatoid Arthritis; Osteoarthritis; Osteomyelitis Neurologic Medical History: Negative for: Dementia; Neuropathy; Quadriplegia; Paraplegia; Seizure Disorder JAKYLIN, HONAN ([id number]) [id number] Page 7 of 7 Oncologic Medical History: Negative for: Received Chemotherapy; Received Radiation Psychiatric Medical History: Positive for:  Confinement Anxiety Negative for: Anorexia/bulimia Immunizations Pneumococcal Vaccine: Received Pneumococcal Vaccination: Yes Received Pneumococcal Vaccination On or After 60th Birthday: Yes Tetanus Vaccine: Last tetanus shot: 02/08/2014 Implantable Devices None Family and Social History Cancer: No; Diabetes: No; Heart Disease: No; Hereditary Spherocytosis: No; Hypertension: Yes - Mother; Kidney Disease: No; Lung Disease: Yes - Mother; Seizures: No; Stroke: No; Thyroid Problems: No; Tuberculosis: No; Former smoker - quit 11 yrs ago; Marital Status - Single; Alcohol Use: Rarely; Drug Use: Current History - TCH; Caffeine Use: Moderate - coffee; Financial Concerns: No; Food, Clothing or Shelter Needs: No; Support System Lacking: No; Transportation Concerns: No Electronic Signature(s) Signed: 05/31/2022 10:08:01 AM By: Geralyn Corwin DO Entered By: Geralyn Corwin on 05/31/2022 09:25:28 -------------------------------------------------------------------------------- SuperBill Details Patient Name: Date of Service: Samuel [name] 05/31/2022 Medical Record Number: [id number] Patient Account Number: [id number] Date of Birth/Sex: Treating RN: [birth year] (52 y.o. Samuel [name] Primary Care Provider: Bertram Denver Other Clinician: Referring Provider: Treating Provider/Extender: Altamese Dilling Weeks in Treatment: 2 Diagnosis Coding ICD-10 Codes Code Description [id number] Non-pressure chronic ulcer of other part of right lower leg with fat layer exposed L97.822 Non-pressure chronic ulcer of other part of left lower leg with fat layer exposed I87.313 Chronic venous hypertension (idiopathic) with ulcer of bilateral lower extremity J44.89 Other specified chronic obstructive pulmonary disease Facility Procedures : CPT4 Code: [id number] Description: (Facility Use Only) [id number] - APPLY MULTLAY COMPRS LWR RT LEG Modifier: Quantity: 1 Physician Procedures : CPT4 Code  Description Modifier [id number] - WC PHYS LEVEL 3 - EST PT ICD-10 Diagnosis Description L97.812 Non-pressure chronic ulcer of other part of right lower leg with fat layer exposed L97.822 Non-pressure chronic ulcer  of other part of left  lower leg with fat layer exposed I87.313 Chronic venous hypertension (idiopathic) with ulcer of bilateral lower extremity J44.89 Other specified chronic obstructive pulmonary disease Quantity: 1 Electronic Signature(s) Signed: 05/31/2022 10:08:01 AM By: Geralyn Corwin DO Entered By: Geralyn Corwin on 05/31/2022 09:36:39

## 2022-06-03 ENCOUNTER — Encounter (HOSPITAL_BASED_OUTPATIENT_CLINIC_OR_DEPARTMENT_OTHER): Payer: 59 | Admitting: Internal Medicine

## 2022-06-04 NOTE — Progress Notes (Signed)
Samuel, [name] ([id number]) [id number] Page 1 of 8 Visit Report for 05/31/2022 Arrival Information Details Patient Name: Date of Service: Samuel [name] 05/31/2022 8:45 Samuel M Medical Record Number: [id number] Patient Account Number: [id number] Date of Birth/Sex: Treating RN: [name] (52 y.o. Samuel [name], Samuel [name] Primary Care Samuel [name]: Samuel [name] Other Clinician: Referring Samuel [name]: Treating Samuel [name]/Extender: Samuel [name] in Treatment: 2 Visit Information History Since Last Visit Added or deleted any medications: No Patient Arrived: Ambulatory Any new allergies or adverse reactions: No Arrival Time: 08:45 Had Samuel fall or experienced change in No Accompanied By: self activities of daily living that may affect Transfer Assistance: None risk of falls: Patient Identification Verified: Yes Signs or symptoms of abuse/neglect since last visito No Secondary Verification Process Completed: Yes Hospitalized since last visit: No Patient Requires Transmission-Based Precautions: No Implantable device outside of the clinic excluding No Patient Has Alerts: No cellular tissue based products placed in the center since last visit: Has Dressing in Place as Prescribed: Yes Has Compression in Place as Prescribed: Yes Pain Present Now: No Notes Patient has not removed compression wrap and has worn it for 2 [name]. Samuel [name] made aware. Electronic Signature(s) Signed: 06/03/2022 5:08:59 PM By: Samuel Stall RN, BSN Entered By: Samuel [name] on 05/31/2022 08:55:22 -------------------------------------------------------------------------------- Compression Therapy Details Patient Name: Date of Service: Samuel [name] 05/31/2022 8:45 Samuel M Medical Record Number: [id number] Patient Account Number: [id number] Date of Birth/Sex: Treating RN: [birth year][name] (52 y.o. Samuel [name] Primary Care Samuel [name]: Samuel [name] Other Clinician: Referring  Samuel [name]: Treating Calais Svehla/Extender: Samuel [name] in Treatment: 2 Compression Therapy Performed for Wound Assessment: Wound #7 Right,Medial Lower Leg Performed By: Clinician Samuel Stall, RN Compression Type: Double Layer Post Procedure Diagnosis Same as Pre-procedure Electronic Signature(s) Signed: 06/03/2022 5:08:59 PM By: Samuel Stall RN, BSN Entered By: Samuel [name] on 05/31/2022 09:15:26 -------------------------------------------------------------------------------- Encounter Discharge Information Details Patient Name: Date of Service: Samuel [name]. 05/31/2022 8:45 Samuel M Medical Record Number: [id number] Patient Account Number: [id number] Date of Birth/Sex: Treating RN: Samuel [name] (52 y.o. Samuel [name] Primary Care Demarquez Ciolek: Samuel [name] Other Clinician: Referring Jakhi Dishman: Treating Samuel [name]/Extender: Samuel [name] in Treatment: 2 Encounter Discharge Information Items Discharge Condition: CONAL, TREHARNE ([id number]) [phone].pdf Page 2 of 8 Ambulatory Status: Ambulatory Discharge Destination: Home Transportation: Private Auto Accompanied By: self Schedule Follow-up Appointment: Yes Clinical Summary of Care: Electronic Signature(s) Signed: 06/03/2022 5:08:59 PM By: Samuel Stall RN, BSN Entered By: Samuel [name] on 05/31/2022 09:15:55 -------------------------------------------------------------------------------- Lower Extremity Assessment Details Patient Name: Date of Service: Samuel [name]. 05/31/2022 8:45 Samuel M Medical Record Number: [id number] Patient Account Number: [id number] Date of Birth/Sex: Treating RN: [birth year] (52 y.o. Samuel [name] Primary Care Aricela Bertagnolli: Samuel [name] Other Clinician: Referring Samuel [name]: Treating Samuel [name]/Extender: Samuel [name] in Treatment: 2 Edema Assessment Assessed: [Left: No] [Right: Yes] Edema:  [Left: Ye] [Right: s] Calf Left: Right: Point of Measurement: 31 cm From Medial Instep 46 cm Ankle Left: Right: Point of Measurement: 13 cm From Medial Instep 26 cm Vascular Assessment Pulses: Dorsalis Pedis Palpable: [Right:Yes] Electronic Signature(s) Signed: 06/03/2022 5:08:59 PM By: Samuel Stall RN, BSN Entered By: Samuel [name] on 05/31/2022 08:56:34 -------------------------------------------------------------------------------- Multi Wound Chart Details Patient Name: Date of Service: Samuel Starch G. 05/31/2022 8:45 Samuel M Medical Record Number: [id number] Patient Account Number: [id number] Date of Birth/Sex: Treating RN: [name] (52 y.o. M) Primary Care Samuel [name]: Samuel Ide,  [name] Other Clinician: Referring Jessen Siegman: Treating Odel Schmid/Extender: Samuel [name], Samuel [name] [name] in Treatment: 2 Vital Signs Height(in): 70 Pulse(bpm): 99 Weight(lbs): 370 Blood Pressure(mmHg): 178/99 Body Mass Index(BMI): 53.1 Temperature(F): 98.6 Respiratory Rate(breaths/min): 20 [7:Photos:] [N/Samuel:N/Samuel [phone].pdf Page 3 of 8] Right, Medial Lower Leg Right, Dorsal Foot N/Samuel Wound Location: Gradually Appeared Gradually Appeared N/Samuel Wounding Event: Venous Leg Ulcer Venous Leg Ulcer N/Samuel Primary Etiology: Lymphedema, Asthma, Chronic Lymphedema, Asthma, Chronic N/Samuel Comorbid History: Obstructive Pulmonary Disease Obstructive Pulmonary Disease (COPD), Hypertension, Peripheral (COPD), Hypertension, Peripheral Venous Disease, Confinement Anxiety Venous Disease, Confinement Anxiety 11/12/2021 03/15/2022 N/Samuel Date Acquired: 2 2 N/Samuel [name] of Treatment: Open Healed - Epithelialized N/Samuel Wound Status: No No N/Samuel Wound Recurrence: 2.5x1.7x0.1 0x0x0 N/Samuel Measurements L x W x D (cm) 3.338 0 N/Samuel Samuel (cm) : rea 0.334 0 N/Samuel Volume (cm) : 22.90% 100.00% N/Samuel % Reduction in Area: 74.30% 100.00% N/Samuel % Reduction in Volume: Full Thickness Without Exposed Full  Thickness Without Exposed N/Samuel Classification: Support Structures Support Structures Medium None Present N/Samuel Exudate Amount: Serosanguineous N/Samuel N/Samuel Exudate Type: red, brown N/Samuel N/Samuel Exudate Color: Distinct, outline attached Distinct, outline attached N/Samuel Wound Margin: Large (67-100%) None Present (0%) N/Samuel Granulation Amount: Red N/Samuel N/Samuel Granulation Quality: None Present (0%) None Present (0%) N/Samuel Necrotic Amount: [name] Layer (Subcutaneous Tissue): Yes Fascia: No N/Samuel Exposed Structures: Fascia: No [name] Layer (Subcutaneous Tissue): No Tendon: No Tendon: No Muscle: No Muscle: No Joint: No Joint: No Bone: No Bone: No Small (1-33%) Large (67-100%) N/Samuel Epithelialization: Scarring: Yes Excoriation: No N/Samuel Periwound Skin Texture: Excoriation: No Induration: No Induration: No Callus: No Callus: No Crepitus: No Crepitus: No Rash: No Rash: No Scarring: No Maceration: No Maceration: No N/Samuel Periwound Skin Moisture: Dry/Scaly: No Dry/Scaly: No Atrophie Blanche: No Atrophie Blanche: No N/Samuel Periwound Skin Color: Cyanosis: No Cyanosis: No Ecchymosis: No Ecchymosis: No Erythema: No Erythema: No Hemosiderin Staining: No Hemosiderin Staining: No Mottled: No Mottled: No Pallor: No Pallor: No Rubor: No Rubor: No Compression Therapy N/Samuel N/Samuel Procedures Performed: Treatment Notes Wound #7 (Lower Leg) Wound Laterality: Right, Medial Cleanser Soap and Water Discharge Instruction: May shower and wash wound with dial antibacterial soap and water prior to dressing change. Vashe 5.8 (oz) Discharge Instruction: Cleanse the wound with Vashe prior to applying Samuel clean dressing using gauze sponges, not tissue or cotton balls. Peri-Wound Care Sween Lotion (Moisturizing lotion) Discharge Instruction: Apply moisturizing lotion as directed Topical Primary Dressing Hydrofera Blue Ready Transfer Foam, 2.5x2.5 (in/in) Discharge Instruction: Apply directly to wound bed as  directed Secondary Dressing ABD Pad, 8x10 Discharge Instruction: Apply over primary dressing as directed. Secured With Compression HUGO, NIEDZWIECKI ([id number]) [id number] Page 4 of 8 Urgo K2 Lite, two layer compression system, regular Discharge Instruction: Apply Urgo K2 Lite as directed (alternative to 3 layer compression). Compression Stockings Add-Ons Electronic Signature(s) Signed: 05/31/2022 10:08:01 AM By: Geralyn Corwin DO Entered By: Geralyn Corwin on 05/31/2022 09:24:11 -------------------------------------------------------------------------------- Multi-Disciplinary Care Plan Details Patient Name: Date of Service: Samuel [name]. 05/31/2022 8:45 Samuel M Medical Record Number: [id number] Patient Account Number: [id number] Date of Birth/Sex: Treating RN: [birth year][name] (52 y.o. Samuel [name] Primary Care Marja Adderley: Samuel [name] Other Clinician: Referring Thea Holshouser: Treating Traveion Ruddock/Extender: Samuel [name] in Treatment: 2 Active Inactive Venous Leg Ulcer Nursing Diagnoses: Potential for venous Insuffiency (use before diagnosis confirmed) Goals: Non-invasive venous studies are completed as ordered Date Initiated: 05/14/2022 Target Resolution Date: 06/28/2022 Goal Status: Active Patient/caregiver will verbalize understanding of disease process and disease  management Date Initiated: 05/14/2022 Target Resolution Date: 06/28/2022 Goal Status: Active Interventions: Assess peripheral edema status every visit. Compression as ordered Provide education on venous insufficiency Treatment Activities: Non-invasive vascular studies : 05/14/2022 Notes: Wound/Skin Impairment Nursing Diagnoses: Knowledge deficit related to ulceration/compromised skin integrity Goals: Patient/caregiver will verbalize understanding of skin care regimen Date Initiated: 05/14/2022 Target Resolution Date: 06/28/2022 Goal Status:  Active Interventions: Assess patient/caregiver ability to perform ulcer/skin care regimen upon admission and as needed Assess ulceration(s) every visit Provide education on ulcer and skin care Treatment Activities: Topical wound management initiated : 05/14/2022 Notes: Electronic Signature(s) Signed: 06/03/2022 5:08:59 PM By: Samuel Stall RN, BSN Entered By: Samuel [name] on 05/31/2022 09:00:50 Amedeo Gory ([id number]) [id number] Page 5 of 8 -------------------------------------------------------------------------------- Pain Assessment Details Patient Name: Date of Service: Samuel [name] 05/31/2022 8:45 Samuel M Medical Record Number: [id number] Patient Account Number: [id number] Date of Birth/Sex: Treating RN: [name] (52 y.o. Samuel [name] Primary Care Glorimar Stroope: Samuel [name] Other Clinician: Referring Margi Edmundson: Treating Jas Betten/Extender: Samuel [name] in Treatment: 2 Active Problems Location of Pain Severity and Description of Pain Patient Has Paino No Site Locations Pain Management and Medication Current Pain Management: Electronic Signature(s) Signed: 06/03/2022 5:08:59 PM By: Samuel Stall RN, BSN Entered By: Samuel [name] on 05/31/2022 08:55:40 -------------------------------------------------------------------------------- Patient/Caregiver Education Details Patient Name: Date of Service: Samuel [name] 5/3/2024andnbsp8:45 Samuel M Medical Record Number: [id number] Patient Account Number: [id number] Date of Birth/Gender: Treating RN: [birth year] [name] (52 y.o. Samuel [name] Primary Care Physician: Samuel [name] Other Clinician: Referring Physician: Treating Physician/Extender: Samuel [name] in Treatment: 2 Education Assessment Education Provided To: Patient Education Topics Provided Wound/Skin Impairment: Handouts: Caring for Your Ulcer Methods: Explain/Verbal Responses:  Reinforcements needed Electronic Signature(s) Signed: 06/03/2022 5:08:59 PM By: Samuel Stall RN, BSN Entered By: Samuel [name] on 05/31/2022 09:01:00 Amedeo Gory ([id number]) [id number].pdf Page 6 of 8 -------------------------------------------------------------------------------- Wound Assessment Details Patient Name: Date of Service: Samuel [name] 05/31/2022 8:45 Samuel M Medical Record Number: [id number] Patient Account Number: [id number] Date of Birth/Sex: Treating RN: November [birth year], [name] (52 y.o. Samuel [name] Primary Care Velisa Regnier: Samuel [name] Other Clinician: Referring Tonjua Rossetti: Treating Andranik Jeune/Extender: Samuel [name] in Treatment: 2 Wound Status Wound Number: 7 Primary Venous Leg Ulcer Etiology: Wound Location: Right, Medial Lower Leg Wound Open Wounding Event: Gradually Appeared Status: Date Acquired: 11/12/2021 Comorbid Lymphedema, Asthma, Chronic Obstructive Pulmonary Disease [name] Of Treatment: 2 History: (COPD), Hypertension, Peripheral Venous Disease, Confinement Clustered Wound: No Anxiety Photos Wound Measurements Length: (cm) 2.5 Width: (cm) 1.7 Depth: (cm) 0.1 Area: (cm) 3.338 Volume: (cm) 0.334 % Reduction in Area: 22.9% % Reduction in Volume: 74.3% Epithelialization: Small (1-33%) Tunneling: No Undermining: No Wound Description Classification: Full Thickness Without Exposed Support Wound Margin: Distinct, outline attached Exudate Amount: Medium Exudate Type: Serosanguineous Exudate Color: red, brown Structures Foul Odor After Cleansing: No Slough/Fibrino No Wound Bed Granulation Amount: Large (67-100%) Exposed Structure Granulation Quality: Red Fascia Exposed: No Necrotic Amount: None Present (0%) [name] Layer (Subcutaneous Tissue) Exposed: Yes Tendon Exposed: No Muscle Exposed: No Joint Exposed: No Bone Exposed: No Periwound Skin Texture Texture Color No Abnormalities Noted: No No  Abnormalities Noted: No Callus: No Atrophie Blanche: No Crepitus: No Cyanosis: No Excoriation: No Ecchymosis: No Induration: No Erythema: No Rash: No Hemosiderin Staining: No Scarring: Yes Mottled: No Pallor: No Moisture Rubor: No No Abnormalities Noted: No Dry / Scaly: No Maceration: No Treatment Notes Wound #7 (Lower Leg) Wound Laterality:  Right, Medial VISHAAL, KLARE ([id number]) [phone].pdf Page 7 of 8 Cleanser Soap and Water Discharge Instruction: May shower and wash wound with dial antibacterial soap and water prior to dressing change. Vashe 5.8 (oz) Discharge Instruction: Cleanse the wound with Vashe prior to applying Samuel clean dressing using gauze sponges, not tissue or cotton balls. Peri-Wound Care Sween Lotion (Moisturizing lotion) Discharge Instruction: Apply moisturizing lotion as directed Topical Primary Dressing Hydrofera Blue Ready Transfer Foam, 2.5x2.5 (in/in) Discharge Instruction: Apply directly to wound bed as directed Secondary Dressing ABD Pad, 8x10 Discharge Instruction: Apply over primary dressing as directed. Secured With Compression Wrap Urgo K2 Lite, two layer compression system, regular Discharge Instruction: Apply Urgo K2 Lite as directed (alternative to 3 layer compression). Compression Stockings Add-Ons Electronic Signature(s) Signed: 06/03/2022 5:08:59 PM By: Samuel Stall RN, BSN Entered By: Samuel [name] on 05/31/2022 08:58:45 -------------------------------------------------------------------------------- Wound Assessment Details Patient Name: Date of Service: Samuel [name] 05/31/2022 8:45 Samuel M Medical Record Number: [id number] Patient Account Number: [id number] Date of Birth/Sex: Treating RN: [birth year], [name] (52 y.o. Samuel [name] Primary Care Kaegan Stigler: Samuel [name] Other Clinician: Referring Zaylyn Bergdoll: Treating Lashai Grosch/Extender: Samuel [name] in Treatment: 2 Wound  Status Wound Number: 8 Primary Venous Leg Ulcer Etiology: Wound Location: Right, Dorsal Foot Wound Healed - Epithelialized Wounding Event: Gradually Appeared Status: Date Acquired: 03/15/2022 Comorbid Lymphedema, Asthma, Chronic Obstructive Pulmonary Disease [name] Of Treatment: 2 History: (COPD), Hypertension, Peripheral Venous Disease, Confinement Clustered Wound: No Anxiety Photos Wound Measurements Length: (cm) 0 Width: (cm) 0 Depth: (cm) 0 Area: (cm) 0 Volume: (cm) 0 ADRIYEL, BAHNER ([id number]) % Reduction in Area: 100% % Reduction in Volume: 100% Epithelialization: Large (67-100%) Tunneling: No Undermining: No [phone].pdf Page 8 of 8 Wound Description Classification: Full Thickness Without Exposed Support Structures Wound Margin: Distinct, outline attached Exudate Amount: None Present Foul Odor After Cleansing: No Slough/Fibrino No Wound Bed Granulation Amount: None Present (0%) Exposed Structure Necrotic Amount: None Present (0%) Fascia Exposed: No [name] Layer (Subcutaneous Tissue) Exposed: No Tendon Exposed: No Muscle Exposed: No Joint Exposed: No Bone Exposed: No Periwound Skin Texture Texture Color No Abnormalities Noted: No No Abnormalities Noted: No Callus: No Atrophie Blanche: No Crepitus: No Cyanosis: No Excoriation: No Ecchymosis: No Induration: No Erythema: No Rash: No Hemosiderin Staining: No Scarring: No Mottled: No Pallor: No Moisture Rubor: No No Abnormalities Noted: No Dry / Scaly: No Maceration: No Electronic Signature(s) Signed: 06/03/2022 5:08:59 PM By: Samuel Stall RN, BSN Entered By: Samuel [name] on 05/31/2022 08:59:11 -------------------------------------------------------------------------------- Vitals Details Patient Name: Date of Service: Samuel Starch G. 05/31/2022 8:45 Samuel M Medical Record Number: [id number] Patient Account Number: [id number] Date of Birth/Sex: Treating RN: [birth year] [name]  (52 y.o. Samuel [name] Primary Care Huntleigh Doolen: Samuel [name] Other Clinician: Referring Bohdi Leeds: Treating Lorisa Scheid/Extender: Samuel [name] in Treatment: 2 Vital Signs Time Taken: 08:55 Temperature (F): 98.6 Height (in): 70 Pulse (bpm): 99 Weight (lbs): 370 Respiratory Rate (breaths/min): 20 Body Mass Index (BMI): 53.1 Blood Pressure (mmHg): 178/99 Reference Range: 80 - 120 mg / dl Electronic Signature(s) Signed: 06/03/2022 5:08:59 PM By: Samuel Stall RN, BSN Entered By: Samuel [name] on 05/31/2022 08:55:36

## 2022-06-06 ENCOUNTER — Encounter (HOSPITAL_BASED_OUTPATIENT_CLINIC_OR_DEPARTMENT_OTHER): Payer: Self-pay | Admitting: Internal Medicine

## 2022-06-07 ENCOUNTER — Other Ambulatory Visit: Payer: Self-pay | Admitting: Nurse Practitioner

## 2022-06-07 DIAGNOSIS — R599 Enlarged lymph nodes, unspecified: Secondary | ICD-10-CM

## 2022-06-10 ENCOUNTER — Encounter (HOSPITAL_BASED_OUTPATIENT_CLINIC_OR_DEPARTMENT_OTHER): Payer: 59 | Admitting: Internal Medicine

## 2022-06-10 ENCOUNTER — Other Ambulatory Visit: Payer: Self-pay

## 2022-06-10 ENCOUNTER — Other Ambulatory Visit: Payer: Self-pay | Admitting: Nurse Practitioner

## 2022-06-10 DIAGNOSIS — L97812 Non-pressure chronic ulcer of other part of right lower leg with fat layer exposed: Secondary | ICD-10-CM | POA: Diagnosis not present

## 2022-06-10 DIAGNOSIS — J4489 Other specified chronic obstructive pulmonary disease: Secondary | ICD-10-CM | POA: Diagnosis not present

## 2022-06-10 DIAGNOSIS — I87313 Chronic venous hypertension (idiopathic) with ulcer of bilateral lower extremity: Secondary | ICD-10-CM

## 2022-06-10 DIAGNOSIS — L97822 Non-pressure chronic ulcer of other part of left lower leg with fat layer exposed: Secondary | ICD-10-CM | POA: Diagnosis not present

## 2022-06-10 DIAGNOSIS — J449 Chronic obstructive pulmonary disease, unspecified: Secondary | ICD-10-CM

## 2022-06-10 DIAGNOSIS — G4733 Obstructive sleep apnea (adult) (pediatric): Secondary | ICD-10-CM | POA: Diagnosis not present

## 2022-06-10 MED ORDER — IPRATROPIUM-ALBUTEROL 0.5-2.5 (3) MG/3ML IN SOLN
3.0000 mL | Freq: Four times a day (QID) | RESPIRATORY_TRACT | 0 refills | Status: DC | PRN
Start: 2022-06-10 — End: 2022-08-30
  Filled 2022-06-10 – 2022-07-25 (×4): qty 360, 30d supply, fill #0

## 2022-06-11 ENCOUNTER — Other Ambulatory Visit: Payer: Self-pay | Admitting: *Deleted

## 2022-06-11 ENCOUNTER — Other Ambulatory Visit: Payer: Self-pay

## 2022-06-11 DIAGNOSIS — L97901 Non-pressure chronic ulcer of unspecified part of unspecified lower leg limited to breakdown of skin: Secondary | ICD-10-CM

## 2022-06-11 DIAGNOSIS — I890 Lymphedema, not elsewhere classified: Secondary | ICD-10-CM

## 2022-06-14 ENCOUNTER — Other Ambulatory Visit: Payer: Self-pay

## 2022-06-17 ENCOUNTER — Other Ambulatory Visit: Payer: Self-pay

## 2022-06-17 ENCOUNTER — Ambulatory Visit (INDEPENDENT_AMBULATORY_CARE_PROVIDER_SITE_OTHER): Payer: 59 | Admitting: Physician Assistant

## 2022-06-17 ENCOUNTER — Encounter (HOSPITAL_BASED_OUTPATIENT_CLINIC_OR_DEPARTMENT_OTHER): Payer: 59 | Admitting: Internal Medicine

## 2022-06-17 ENCOUNTER — Ambulatory Visit (HOSPITAL_COMMUNITY)
Admission: RE | Admit: 2022-06-17 | Discharge: 2022-06-17 | Disposition: A | Discharge status: Home / Self Care | Payer: 59 | Source: Ambulatory Visit | Attending: Surgery | Admitting: Surgery

## 2022-06-17 VITALS — BP 130/82 | HR 97 | Temp 97.0°F | Resp 22 | Ht 70.0 in | Wt 377.0 lb

## 2022-06-17 DIAGNOSIS — I87313 Chronic venous hypertension (idiopathic) with ulcer of bilateral lower extremity: Secondary | ICD-10-CM

## 2022-06-17 DIAGNOSIS — I890 Lymphedema, not elsewhere classified: Secondary | ICD-10-CM

## 2022-06-17 DIAGNOSIS — L97812 Non-pressure chronic ulcer of other part of right lower leg with fat layer exposed: Secondary | ICD-10-CM | POA: Diagnosis not present

## 2022-06-17 DIAGNOSIS — G4733 Obstructive sleep apnea (adult) (pediatric): Secondary | ICD-10-CM | POA: Diagnosis not present

## 2022-06-17 DIAGNOSIS — M7989 Other specified soft tissue disorders: Secondary | ICD-10-CM

## 2022-06-17 DIAGNOSIS — L97901 Non-pressure chronic ulcer of unspecified part of unspecified lower leg limited to breakdown of skin: Secondary | ICD-10-CM | POA: Diagnosis not present

## 2022-06-17 DIAGNOSIS — J4489 Other specified chronic obstructive pulmonary disease: Secondary | ICD-10-CM | POA: Diagnosis not present

## 2022-06-17 DIAGNOSIS — L97822 Non-pressure chronic ulcer of other part of left lower leg with fat layer exposed: Secondary | ICD-10-CM | POA: Diagnosis not present

## 2022-06-17 NOTE — Progress Notes (Signed)
Office Note     CC:  follow up Requesting Provider:  Claiborne Rigg, NP  HPI: Samuel [name] is a 52 y.o. (28-May-1970) male who presents for evaluation of right medial lower leg chronic ulcer.  He was seen in the past for lymphedema by Dr. Myra [name] most recently in 2016.  At that time he also had a ulceration on his right medial lower leg.  Ulcer was healed with Unna boots at that time.  He did not have any venous insufficiency of the right leg.  He states since he was last seen in the office, ulcerations of bilateral lower extremities have been on and off.  He wears knee-high compression socks on a daily basis.  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  He has been dealing with a large ulcer on his right medial lower leg since last year.  He is active with [facility] a [facility] wound clinic and receives Unna boots on a weekly basis.  He continues to wear his compression on his left leg currently.  He has been working on exercise and diet and has been losing some weight.  He also tries to elevate his legs above the level of his heart when possible during the day especially after work.  Despite compression, elevation, and exercise he continues to have on and off ulcers.  He denies tobacco use.  He denies history of DVT.   Past Medical History:  Diagnosis Date   Asthma    Hypertension    Prediabetes    Ulcer    venous insufficiency    History reviewed. No pertinent surgical history.  Social History   Socioeconomic History   Marital status: Single    Spouse name: Not on file   Number of children: 4   Years of education: Not on file   Highest education level: Not on file  Occupational History   Occupation: Handyman    Employer: [employer]  Tobacco Use   Smoking status: Former    Packs/day: 1.00    Years: 15.00    Additional pack years: 0.00    Total pack years: 15.00    Types: Cigarettes    Quit date: 10/28/2012    Years since quitting: 9.6   Smokeless  tobacco: Never  Vaping Use   Vaping Use: Never used  Substance and Sexual Activity   Alcohol use: Yes    Alcohol/week: 4.0 standard drinks of alcohol    Types: 2 Cans of beer, 2 Shots of liquor per week   Drug use: No   Sexual activity: Yes  Other Topics Concern   Not on file  Social History Narrative   Not on file   Social Determinants of Health   Financial Resource Strain: Not on file  Food Insecurity: Not on file  Transportation Needs: Not on file  Physical Activity: Not on file  Stress: Not on file  Social Connections: Not on file  Intimate Partner Violence: Not on file    Family History  Problem Relation Age of Onset   Asthma Mother    Hypertension Mother     Current Outpatient Medications  Medication Sig Dispense Refill   albuterol (PROVENTIL HFA) 108 (90 Base) MCG/ACT inhaler Inhale 2 puffs into the lungs every 6 (six) hours as needed for wheezing or shortness of breath. 6.7 g 0   Dulaglutide (TRULICITY) 1.5 MG/0.5ML SOPN Inject 1.5 mg into the skin once a week. (Patient not taking: Reported  on 06/17/2022) 6 mL 3   fluticasone furoate-vilanterol (BREO ELLIPTA) 100-25 MCG/ACT AEPB Inhale 1 puff into the lungs daily. 60 each 6   ipratropium-albuterol (DUONEB) 0.5-2.5 (3) MG/3ML SOLN TAKE 3 MLS BY NEBULIZATION EVERY 6 (SIX) HOURS AS NEEDED FOR SHORTNESS OF BREATH 360 mL 0   losartan-hydrochlorothiazide (HYZAAR) 100-25 MG tablet Take 1 tablet by mouth daily. 90 tablet 1   metoprolol succinate (TOPROL-XL) 50 MG 24 hr tablet Take 1 tablet (50 mg total) by mouth daily. 90 tablet 1   sildenafil (VIAGRA) 100 MG tablet Take 1 tablet (100 mg total) by mouth as needed for erectile dysfunction. 10 tablet 1   No current facility-administered medications for this visit.    Allergies  Allergen Reactions   Shrimp [Shellfish Allergy]     Lip swell up.      REVIEW OF SYSTEMS:   [X]  denotes positive finding, [ ]  denotes negative finding Cardiac  Comments:  Chest pain or chest  pressure:    Shortness of breath upon exertion:    Short of breath when lying flat:    Irregular heart rhythm:        Vascular    Pain in calf, thigh, or hip brought on by ambulation:    Pain in feet at night that wakes you up from your sleep:     Blood clot in your veins:    Leg swelling:         Pulmonary    Oxygen at home:    Productive cough:     Wheezing:         Neurologic    Sudden weakness in arms or legs:     Sudden numbness in arms or legs:     Sudden onset of difficulty speaking or slurred speech:    Temporary loss of vision in one eye:     Problems with dizziness:         Gastrointestinal    Blood in stool:     Vomited blood:         Genitourinary    Burning when urinating:     Blood in urine:        Psychiatric    Major depression:         Hematologic    Bleeding problems:    Problems with blood clotting too easily:        Skin    Rashes or ulcers:        Constitutional    Fever or chills:      PHYSICAL EXAMINATION:  Vitals:   06/17/22 1331  BP: 130/82  Pulse: 97  Resp: (!) 22  Temp: (!) 97 F (36.1 C)  TempSrc: Temporal  SpO2: 100%  Weight: (!) 377 lb (171 kg)  Height: 5\' 10"  (1.778 m)    General:  WDWN in NAD; vital signs documented above Gait: Not observed HENT: WNL, normocephalic Pulmonary: normal non-labored breathing , without Rales, rhonchi,  wheezing Cardiac: regular HR Abdomen: soft, NT, no masses Skin: without rashes Vascular Exam/Pulses: 2+ palpable DP pulses bilaterally Extremities: Wound of the right medial lower leg pictured below; hyperpigmentation of medial lower legs bilaterally.  Indurated hyperkeratotic skin bilaterally Musculoskeletal: no muscle wasting or atrophy  Neurologic: A&O X 3 Psychiatric:  The pt has Normal affect.    Non-Invasive Vascular Imaging:   Right lower extremity venous reflux study was negative for DVT and superficial thrombophlebitis Negative for deep venous insufficiency GSV is  incompetent at the distal thigh and proximal  calf however not at the saphenofemoral junction    ASSESSMENT/PLAN:: 52 y.o. male here for follow up for evaluation of chronic wound of the right lower leg with medical history significant for lymphedema  -Samuel [name] is a 52 year old male who was last seen in the office in 2016 and treated for lymphedema with right lower extremity ulceration.  Patient states since that time he continues to have on and off ulcerations of bilateral lower extremities.  Current ulceration has been present since last fall.  He is active with [facility] and receives [employer] changes on a weekly basis.  He continues to wear compression on a daily basis to his left leg.  Patient also states he wears compression on a daily basis once the ulcers have healed.  He makes an effort to elevate his legs when possible during the day especially after work.  Over the past several months he has been mildly successful with weight loss due to elevation and diet.  Venous reflux study of the right lower extremity was negative for DVT.  He does not have any evidence of deep venous reflux.  He does have some mild superficial venous reflux however GSV would not qualify for laser saphenous vein ablation.  Recommendations include continued use of compression on a daily basis.  Continue Unna boot to the right leg until ulcer is completely healed.  Continue to elevate the legs when possible during the day and continue to focus on exercise and diet for weight loss.  I believe he will be a great candidate for a referral for lymphedema pumps.  We will place a referral today with bio tab.     Emilie Rutter, PA-C [facility] [phone]  Clinic MD:   Samuel [name]

## 2022-06-25 ENCOUNTER — Encounter (HOSPITAL_BASED_OUTPATIENT_CLINIC_OR_DEPARTMENT_OTHER): Payer: 59 | Admitting: Internal Medicine

## 2022-06-25 ENCOUNTER — Other Ambulatory Visit: Payer: Self-pay

## 2022-06-25 DIAGNOSIS — J4489 Other specified chronic obstructive pulmonary disease: Secondary | ICD-10-CM

## 2022-06-25 DIAGNOSIS — L97812 Non-pressure chronic ulcer of other part of right lower leg with fat layer exposed: Secondary | ICD-10-CM

## 2022-06-25 DIAGNOSIS — L97822 Non-pressure chronic ulcer of other part of left lower leg with fat layer exposed: Secondary | ICD-10-CM

## 2022-06-25 DIAGNOSIS — I87313 Chronic venous hypertension (idiopathic) with ulcer of bilateral lower extremity: Secondary | ICD-10-CM | POA: Diagnosis not present

## 2022-06-25 DIAGNOSIS — G4733 Obstructive sleep apnea (adult) (pediatric): Secondary | ICD-10-CM | POA: Diagnosis not present

## 2022-07-01 ENCOUNTER — Encounter (HOSPITAL_BASED_OUTPATIENT_CLINIC_OR_DEPARTMENT_OTHER): Payer: 59 | Attending: Internal Medicine | Admitting: Internal Medicine

## 2022-07-01 DIAGNOSIS — I87311 Chronic venous hypertension (idiopathic) with ulcer of right lower extremity: Secondary | ICD-10-CM | POA: Diagnosis not present

## 2022-07-01 DIAGNOSIS — J4489 Other specified chronic obstructive pulmonary disease: Secondary | ICD-10-CM | POA: Diagnosis not present

## 2022-07-01 DIAGNOSIS — I10 Essential (primary) hypertension: Secondary | ICD-10-CM | POA: Insufficient documentation

## 2022-07-01 DIAGNOSIS — L97812 Non-pressure chronic ulcer of other part of right lower leg with fat layer exposed: Secondary | ICD-10-CM | POA: Diagnosis not present

## 2022-07-01 DIAGNOSIS — G4733 Obstructive sleep apnea (adult) (pediatric): Secondary | ICD-10-CM | POA: Diagnosis not present

## 2022-07-01 DIAGNOSIS — L97822 Non-pressure chronic ulcer of other part of left lower leg with fat layer exposed: Secondary | ICD-10-CM | POA: Diagnosis not present

## 2022-07-01 DIAGNOSIS — I87313 Chronic venous hypertension (idiopathic) with ulcer of bilateral lower extremity: Secondary | ICD-10-CM | POA: Insufficient documentation

## 2022-07-01 DIAGNOSIS — Z87891 Personal history of nicotine dependence: Secondary | ICD-10-CM | POA: Diagnosis not present

## 2022-07-01 DIAGNOSIS — I872 Venous insufficiency (chronic) (peripheral): Secondary | ICD-10-CM | POA: Diagnosis not present

## 2022-07-08 ENCOUNTER — Encounter (HOSPITAL_BASED_OUTPATIENT_CLINIC_OR_DEPARTMENT_OTHER): Payer: 59 | Admitting: Internal Medicine

## 2022-07-08 DIAGNOSIS — J4489 Other specified chronic obstructive pulmonary disease: Secondary | ICD-10-CM

## 2022-07-08 DIAGNOSIS — I87313 Chronic venous hypertension (idiopathic) with ulcer of bilateral lower extremity: Secondary | ICD-10-CM

## 2022-07-08 DIAGNOSIS — G4733 Obstructive sleep apnea (adult) (pediatric): Secondary | ICD-10-CM | POA: Diagnosis not present

## 2022-07-08 DIAGNOSIS — L97822 Non-pressure chronic ulcer of other part of left lower leg with fat layer exposed: Secondary | ICD-10-CM

## 2022-07-08 DIAGNOSIS — L97812 Non-pressure chronic ulcer of other part of right lower leg with fat layer exposed: Secondary | ICD-10-CM

## 2022-07-08 DIAGNOSIS — I10 Essential (primary) hypertension: Secondary | ICD-10-CM | POA: Diagnosis not present

## 2022-07-08 DIAGNOSIS — Z87891 Personal history of nicotine dependence: Secondary | ICD-10-CM | POA: Diagnosis not present

## 2022-07-08 DIAGNOSIS — I872 Venous insufficiency (chronic) (peripheral): Secondary | ICD-10-CM | POA: Diagnosis not present

## 2022-07-09 ENCOUNTER — Other Ambulatory Visit: Payer: Self-pay

## 2022-07-09 NOTE — Progress Notes (Addendum)
SHOWN, DISSINGER ([id number]) [id number] Page 1 of 7 Visit Report for 07/08/2022 Arrival Information Details Patient Name: Date of Service: Samuel [name] 07/08/2022 3:30 PM Medical Record Number: [id number] Patient Account Number: [id number] Date of Birth/Sex: Treating RN: [birth year] (52 y.o. Samuel [name] Primary Care Karin Pinedo: Bertram Denver Other Clinician: Referring Kharee Lesesne: Treating Cosmo Tetreault/Extender: Altamese Dilling Weeks in Treatment: 7 Visit Information History Since Last Visit All ordered tests and consults were completed: Yes Patient Arrived: Ambulatory Added or deleted any medications: No Arrival Time: 15:37 Any new allergies or adverse reactions: No Accompanied By: daughter Had a fall or experienced change in No Transfer Assistance: None activities of daily living that may affect Patient Identification Verified: Yes risk of falls: Secondary Verification Process Completed: Yes Signs or symptoms of abuse/neglect since last visito No Patient Requires Transmission-Based Precautions: No Hospitalized since last visit: No Patient Has Alerts: No Implantable device outside of the clinic excluding No cellular tissue based products placed in the center since last visit: Has Dressing in Place as Prescribed: Yes Pain Present Now: No Electronic Signature(s) Signed: 07/09/2022 7:50:28 AM By: Brenton Grills Entered By: Brenton Grills on 07/08/2022 15:37:41 -------------------------------------------------------------------------------- Clinic Level of Care Assessment Details Patient Name: Date of Service: Samuel [name] 07/08/2022 3:30 PM Medical Record Number: [id number] Patient Account Number: [id number] Date of Birth/Sex: Treating RN: [birth year] (52 y.o. Samuel [name] Primary Care Roshanna Cimino: Bertram Denver Other Clinician: Referring Annabelle Rexroad: Treating Maritza Goldsborough/Extender: Altamese Dilling Weeks in Treatment: 7 Clinic Level of Care Assessment Items TOOL 1 Quantity Score X- 1 0 Use when EandM and Procedure is performed on INITIAL visit ASSESSMENTS - Nursing Assessment / Reassessment X- 1 20 General Physical Exam (combine w/ comprehensive assessment (listed just below) when performed on new pt. evals) X- 1 25 Comprehensive Assessment (HX, ROS, Risk Assessments, Wounds Hx, etc.) ASSESSMENTS - Wound and Skin Assessment / Reassessment X- 1 10 Dermatologic / Skin Assessment (not related to wound area) ASSESSMENTS - Ostomy and/or Continence Assessment and Care []  - 0 Incontinence Assessment and Management []  - 0 Ostomy Care Assessment and Management (repouching, etc.) PROCESS - Coordination of Care []  - 0 Simple Patient / Family Education for ongoing care Samuel [name], Samuel [name] ([id number]) [id number].pdf Page 2 of 7 []  - 0 Complex (extensive) Patient / Family Education for ongoing care X- 1 10 Staff obtains Consents, Records, T Results / Process Orders est []  - 0 Staff telephones HHA, Nursing Homes / Clarify orders / etc []  - 0 Routine Transfer to another Facility (non-emergent condition) []  - 0 Routine Hospital Admission (non-emergent condition) []  - 0 New Admissions / Manufacturing engineer / Ordering NPWT Apligraf, etc. , []  - 0 Emergency Hospital Admission (emergent condition) PROCESS - Special Needs []  - 0 Pediatric / Minor Patient Management []  - 0 Isolation Patient Management []  - 0 Hearing / Language / Visual special needs []  - 0 Assessment of Community assistance (transportation, D/C planning, etc.) []  - 0 Additional assistance / Altered mentation []  - 0 Support Surface(s) Assessment (bed, cushion, seat, etc.) INTERVENTIONS - Miscellaneous []  - 0 External ear exam []  - 0 Patient Transfer (multiple staff / Nurse, adult / Similar devices) []  - 0 Simple Staple / Suture removal (25 or less) []  - 0 Complex Staple /  Suture removal (26 or more) []  - 0 Hypo/Hyperglycemic Management (do not check if billed separately) []  - 0 Ankle / Brachial Index (ABI) - do not check if billed separately Has  the patient been seen at the hospital within the last three years: Yes Total Score: 65 Level Of Care: New/Established - Level 2 Electronic Signature(s) Signed: 07/09/2022 7:50:28 AM By: Brenton Grills Entered By: Brenton Grills on 07/08/2022 15:57:19 -------------------------------------------------------------------------------- Encounter Discharge Information Details Patient Name: Date of Service: Samuel [name]. 07/08/2022 3:30 PM Medical Record Number: [id number] Patient Account Number: [id number] Date of Birth/Sex: Treating RN: 1970-12-08 (52 y.o. Samuel [name] Primary Care Stuart Guillen: Bertram Denver Other Clinician: Referring Brayant Dorr: Treating Juno Alers/Extender: Benjamin Stain in Treatment: 7 Encounter Discharge Information Items Discharge Condition: Stable Ambulatory Status: Ambulatory Discharge Destination: Home Transportation: Private Auto Accompanied By: daughter Schedule Follow-up Appointment: Yes Clinical Summary of Care: Patient Declined Electronic Signature(s) Signed: 07/09/2022 7:50:28 AM By: Brenton Grills Entered By: Brenton Grills on 07/08/2022 16:05:19 Samuel [name] ([id number]) [id number] Page 3 of 7 -------------------------------------------------------------------------------- Lower Extremity Assessment Details Patient Name: Date of Service: Samuel [name] 07/08/2022 3:30 PM Medical Record Number: [id number] Patient Account Number: [id number] Date of Birth/Sex: Treating RN: February 29, 1970 (52 y.o. Samuel [name] Primary Care Gaurav Baldree: Bertram Denver Other Clinician: Referring Seletha Zimmermann: Treating Telisa Ohlsen/Extender: Altamese Dilling Weeks in Treatment: 7 Edema Assessment Assessed: [Left: No]  [Right: No] Edema: [Left: Ye] [Right: s] Calf Left: Right: Point of Measurement: 31 cm From Medial Instep 52.5 cm Ankle Left: Right: Point of Measurement: 13 cm From Medial Instep 24 cm Vascular Assessment Pulses: Dorsalis Pedis Palpable: [Right:Yes] Electronic Signature(s) Signed: 07/09/2022 7:50:28 AM By: Brenton Grills Entered By: Brenton Grills on 07/08/2022 15:45:07 -------------------------------------------------------------------------------- Multi Wound Chart Details Patient Name: Date of Service: Samuel [name]. 07/08/2022 3:30 PM Medical Record Number: [id number] Patient Account Number: [id number] Date of Birth/Sex: Treating RN: 22-Oct-1970 (52 y.o. M) Primary Care Nicholai Willette: Bertram Denver Other Clinician: Referring Laron Boorman: Treating Modesty Rudy/Extender: Altamese Dilling Weeks in Treatment: 7 Vital Signs Height(in): 70 Pulse(bpm): 112 Weight(lbs): 370 Blood Pressure(mmHg): 165/95 Body Mass Index(BMI): 53.1 Temperature(F): 97.9 Respiratory Rate(breaths/min): 18 [7:Photos: No Photos Right, Medial Lower Leg Wound Location: Gradually Appeared Wounding Event: Venous Leg Ulcer Primary Etiology: Lymphedema, Asthma, Chronic Comorbid History: Obstructive Pulmonary Disease (COPD), Hypertension, Peripheral Venous Disease,  Confinement Anxiety] [N/A:N/A N/A N/A N/A N/A] Samuel [name], Samuel [name] ([id number]) [7:11/12/2021 Date Acquired: 7 Weeks of Treatment: Healed - Epithelialized Wound Status: No Wound Recurrence: 0x0x0 Measurements L x W x D (cm) 0 A (cm) : rea 0 Volume (cm) : 100.00% % Reduction in Area: 100.00% % Reduction in Volume: Full Thickness  Without Exposed Classification: Support Structures Small Exudate Amount: Serosanguineous Exudate Type: red, brown Exudate Color: Distinct, outline attached Wound Margin: Large (67-100%) Granulation Amount: Pink, Pale Granulation Quality: None Present  (0%) Necrotic Amount: Fat Layer (Subcutaneous Tissue): Yes N/A  Exposed Structures: Fascia: No Tendon: No Muscle: No Joint: No Bone: No Large (67-100%) Epithelialization: Scarring: Yes Periwound Skin Texture: Excoriation: No Induration: No Callus: No  Crepitus: No Rash: No Maceration: No Periwound Skin Moisture: Dry/Scaly: No Atrophie Blanche: No Periwound Skin Color: Cyanosis: No Ecchymosis: No Erythema: No Hemosiderin Staining: No Mottled: No Pallor: No Rubor: No] [N/A:N/A N/A N/A N/A N/A N/A N/A  N/A N/A N/A N/A N/A N/A N/A N/A N/A N/A N/A N/A N/A N/A] Treatment Notes Electronic Signature(s) Signed: 07/08/2022 4:48:17 PM By: Geralyn Corwin DO Entered By: Geralyn Corwin on 07/08/2022 16:00:51 -------------------------------------------------------------------------------- Multi-Disciplinary Care Plan Details Patient Name: Date of Service: Samuel [name]. 07/08/2022 3:30 PM Medical Record Number: [id number] Patient Account Number: [id number]  Date of Birth/Sex: Treating RN: 08/01/1970 (52 y.o. Samuel [name] Primary Care Meegan Shanafelt: Bertram Denver Other Clinician: Referring Ece Cumberland: Treating Trini Christiansen/Extender: Altamese Dilling Weeks in Treatment: 7 Active Inactive Electronic Signature(s) Signed: 07/22/2022 2:03:31 PM By: Shawn Stall RN, BSN Signed: 08/14/2022 8:00:09 AM By: Brenton Grills Previous Signature: 07/09/2022 7:50:28 AM Version By: Brenton Grills Entered By: Shawn Stall on 07/22/2022 14:03:31 Samuel [name] ([id number]) [id number].pdf Page 5 of 7 -------------------------------------------------------------------------------- Pain Assessment Details Patient Name: Date of Service: Samuel [name] 07/08/2022 3:30 PM Medical Record Number: [id number] Patient Account Number: [id number] Date of Birth/Sex: Treating RN: [birth year] (52 y.o. Samuel [name] Primary Care Shaquanda Graves: Bertram Denver Other Clinician: Referring Kayloni Rocco: Treating Elyas Villamor/Extender: Altamese Dilling Weeks in Treatment: 7 Active Problems Location of Pain Severity and Description of Pain Patient Has Paino No Site Locations Pain Management and Medication Current Pain Management: Electronic Signature(s) Signed: 07/09/2022 7:50:28 AM By: Brenton Grills Entered By: Brenton Grills on 07/08/2022 15:38:22 -------------------------------------------------------------------------------- Patient/Caregiver Education Details Patient Name: Date of Service: Samuel [name] 6/10/2024andnbsp3:30 PM Medical Record Number: [id number] Patient Account Number: [id number] Date of Birth/Gender: Treating RN: [birth year] (52 y.o. Samuel [name] Primary Care Physician: Bertram Denver Other Clinician: Referring Physician: Treating Physician/Extender: Benjamin Stain in Treatment: 7 Education Assessment Education Provided To: Patient and Caregiver Education Topics Provided Wound/Skin Impairment: Methods: Explain/Verbal Responses: State content correctly Electronic Signature(s) Samuel [name], Samuel [name] ([id number]) [id number] Page 6 of 7 Signed: 07/09/2022 7:50:28 AM By: Brenton Grills Entered By: Brenton Grills on 07/08/2022 15:48:57 -------------------------------------------------------------------------------- Wound Assessment Details Patient Name: Date of Service: Samuel [name] 07/08/2022 3:30 PM Medical Record Number: [id number] Patient Account Number: [id number] Date of Birth/Sex: Treating RN: January 04, 1971 (52 y.o. Samuel [name] Primary Care Tassie Pollett: Bertram Denver Other Clinician: Referring Trenika Hudson: Treating Baird Polinski/Extender: Altamese Dilling Weeks in Treatment: 7 Wound Status Wound Number: 7 Primary Venous Leg Ulcer Etiology: Wound Location: Right, Medial Lower Leg Wound Healed - Epithelialized Wounding Event: Gradually Appeared Status: Date Acquired: 11/12/2021 Comorbid Lymphedema, Asthma, Chronic  Obstructive Pulmonary Disease Weeks Of Treatment: 7 History: (COPD), Hypertension, Peripheral Venous Disease, Confinement Clustered Wound: No Anxiety Wound Measurements Length: (cm) Width: (cm) Depth: (cm) Area: (cm) Volume: (cm) 0 % Reduction in Area: 100% 0 % Reduction in Volume: 100% 0 Epithelialization: Large (67-100%) 0 Tunneling: No 0 Undermining: No Wound Description Classification: Full Thickness Without Exposed Support Wound Margin: Distinct, outline attached Exudate Amount: Small Exudate Type: Serosanguineous Exudate Color: red, brown Structures Foul Odor After Cleansing: No Slough/Fibrino No Wound Bed Granulation Amount: Large (67-100%) Exposed Structure Granulation Quality: Pink, Pale Fascia Exposed: No Necrotic Amount: None Present (0%) Fat Layer (Subcutaneous Tissue) Exposed: Yes Tendon Exposed: No Muscle Exposed: No Joint Exposed: No Bone Exposed: No Periwound Skin Texture Texture Color No Abnormalities Noted: No No Abnormalities Noted: No Callus: No Atrophie Blanche: No Crepitus: No Cyanosis: No Excoriation: No Ecchymosis: No Induration: No Erythema: No Rash: No Hemosiderin Staining: No Scarring: Yes Mottled: No Pallor: No Moisture Rubor: No No Abnormalities Noted: No Dry / Scaly: No Maceration: No Treatment Notes Wound #7 (Lower Leg) Wound Laterality: Right, Medial Cleanser Peri-Wound Care Topical Samuel [name], Samuel [name] ([id number]) [phone].pdf Page 7 of 7 Primary Dressing Secondary Dressing Secured With Compression Wrap Compression Stockings Add-Ons Electronic Signature(s) Signed: 07/09/2022 7:50:28 AM By: Brenton Grills Entered By: Brenton Grills on 07/08/2022 15:56:19 -------------------------------------------------------------------------------- Vitals Details Patient Name: Date of Service: Samuel Quant N G. 07/08/2022 3:30  PM Medical Record Number: [id number] Patient Account Number:  [id number] Date of Birth/Sex: Treating RN: Sep 12, 1970 (52 y.o. Samuel [name] Primary Care Colbey Wirtanen: Bertram Denver Other Clinician: Referring Zynia Wojtowicz: Treating Emaline Karnes/Extender: Altamese Dilling Weeks in Treatment: 7 Vital Signs Time Taken: 15:37 Temperature (F): 97.9 Height (in): 70 Pulse (bpm): 112 Weight (lbs): 370 Respiratory Rate (breaths/min): 18 Body Mass Index (BMI): 53.1 Blood Pressure (mmHg): 165/95 Reference Range: 80 - 120 mg / dl Electronic Signature(s) Signed: 07/09/2022 7:50:28 AM By: Brenton Grills Entered By: Brenton Grills on 07/08/2022 15:38:09

## 2022-07-10 NOTE — Progress Notes (Signed)
ODDIE, HECKLE ([id number]) [id number] Page 1 of 7 Visit Report for 07/08/2022 Chief Complaint Document Details Patient Name: Date of Service: Samuel [name] 07/08/2022 3:30 PM Medical Record Number: [id number] Patient Account Number: [id number] Date of Birth/Sex: Treating RN: 12-02-1970 (52 y.o. M) Primary Care Provider: Bertram Denver Other Clinician: Referring Provider: Treating Provider/Extender: Benjamin Stain in Treatment: 7 Information Obtained from: Patient Chief Complaint patient is here for review of wounds on his right medial lower leg and right lateral foot 06/27/17; patient returns to clinic today with a reopening on his bilateral medial lower calfs 05/14/2022; bilateral lower extremity wounds Electronic Signature(s) Signed: 07/08/2022 4:48:17 PM By: Geralyn Corwin DO Entered By: Geralyn Corwin on 07/08/2022 16:00:58 -------------------------------------------------------------------------------- HPI Details Patient Name: Date of Service: Samuel Starch G. 07/08/2022 3:30 PM Medical Record Number: [id number] Patient Account Number: [id number] Date of Birth/Sex: Treating RN: Sep 18, 1970 (52 y.o. M) Primary Care Provider: Bertram Denver Other Clinician: Referring Provider: Treating Provider/Extender: Altamese Dilling Weeks in Treatment: 7 History of Present Illness HPI Description: 05/14/2022 Samuel [name] is a 52 year old male with a past medical history of venous insufficiency, COPD and OSA that presents to the clinic for a 56-month history of nonhealing ulcers to his legs bilaterally. He has compression stockings but has not been wearing them. He has been keeping the areas covered. He states he has a history of wounds that wax and wane in healing. He currently denies signs of infection. 4/22; patient presents for follow-up. The wraps had slid down over the past week. We have been  using Hydrofera Blue under 3 layer compression to the lower extremities bilaterally. Adding this he has no issues or complaints. Overall the wounds are smaller. The left lower extremity wounds have healed. 5/3; patient presents for follow-up. He missed his last clinic appointment and has had the wrap on for 2 weeks. He knows to not keep this on for more than a week. We have been using Hydrofera Blue under 3 layer compression. The right lateral foot wound has healed. 5/13; patient presents for follow-up. We have been using Hydrofera Blue under 3 layer compression to the right lower extremity. His wound is smaller. 5/20; Patient presents for follow up. We have been using hydrofera blue under 3 layer compression to the right lower extremity. The wound is smaller. He has no issues or complaints today. 5/28; patient presents for follow-up. We have been using Hydrofera Blue under 3 layer compression to the right lower extremity. Wound is smaller. 6/3; patient has a small open wound on the right anterior lower leg in the setting of severe chronic venous insufficiency we have been using 3 layer compression. Hydrofera Blue on the wound. 6/10; patient presents for follow-up. We have been using Hydrofera Blue under 3 layer compression. His wound has healed. He has compression stockings at home. Electronic Signature(s) Signed: 07/08/2022 4:48:17 PM By: Geralyn Corwin DO Entered By: Geralyn Corwin on 07/08/2022 16:01:22 -------------------------------------------------------------------------------- Physical Exam Details Patient Name: Date of Service: Samuel [name] 07/08/2022 3:30 PM Medical Record Number: [id number] Patient Account Number: [id number] Date of Birth/Sex: Treating RN: 04-13-1970 (52 y.o. Burris Albers, Durwin Glaze ([id number]) [id number] Page 2 of 7 Primary Care Provider: Bertram Denver Other Clinician: Referring Provider: Treating Provider/Extender:  Altamese Dilling Weeks in Treatment: 7 Constitutional respirations regular, non-labored and within target range for patient.. Cardiovascular 2+ dorsalis pedis/posterior tibialis pulses. Psychiatric pleasant and cooperative. Notes Epithelization to the previous  wound site to the right lower extremity. Decent edema control. Electronic Signature(s) Signed: 07/08/2022 4:48:17 PM By: Geralyn Corwin DO Entered By: Geralyn Corwin on 07/08/2022 16:02:01 -------------------------------------------------------------------------------- Physician Orders Details Patient Name: Date of Service: Samuel [name] 07/08/2022 3:30 PM Medical Record Number: [id number] Patient Account Number: [id number] Date of Birth/Sex: Treating RN: 01/19/1971 (52 y.o. Yates Decamp Primary Care Provider: Bertram Denver Other Clinician: Referring Provider: Treating Provider/Extender: Altamese Dilling Weeks in Treatment: 7 Verbal / Phone Orders: No Diagnosis Coding Follow-up Appointments ppointment in 2 weeks. - Dr Mikey Bussing for preventative visit. Congratulations on healing!! Return A Wear Tubigrip home then switch to compression. Other: - bring in your compression stockings next week. Anesthetic (In clinic) Topical Lidocaine 4% applied to wound bed Bathing/ Shower/ Hygiene May shower with protection but do not get wound dressing(s) wet. Protect dressing(s) with water repellant cover (for example, large plastic bag) or a cast cover and may then take shower. Edema Control - Lymphedema / SCD / Other Elevate legs to the level of the heart or above for 30 minutes daily and/or when sitting for 3-4 times a day throughout the day. A void standing for long periods of time. Exercise regularly If compression wraps slide down please call wound center and speak with a nurse. Electronic Signature(s) Signed: 07/08/2022 4:48:17 PM By: Geralyn Corwin DO Signed: 07/09/2022 7:50:28 AM  By: Brenton Grills Entered By: Brenton Grills on 07/08/2022 16:04:37 -------------------------------------------------------------------------------- Problem List Details Patient Name: Date of Service: Samuel [name] 07/08/2022 3:30 PM Medical Record Number: [id number] Patient Account Number: [id number] Date of Birth/Sex: Treating RN: July 21, 1970 (52 y.o. Yates Decamp Primary Care Provider: Bertram Denver Other Clinician: Referring Provider: Treating Provider/Extender: Benjamin Stain in Treatment: [address] AHLIJAH, RAIA ([id number]) [id number] Page 3 of 7 ICD-10 Encounter Code Description Active Date MDM Diagnosis L97.812 Non-pressure chronic ulcer of other part of right lower leg with fat layer 05/14/2022 No Yes exposed L97.822 Non-pressure chronic ulcer of other part of left lower leg with fat layer exposed4/16/2024 No Yes I87.313 Chronic venous hypertension (idiopathic) with ulcer of bilateral lower extremity 05/14/2022 No Yes J44.89 Other specified chronic obstructive pulmonary disease 05/14/2022 No Yes Inactive Problems Resolved Problems Electronic Signature(s) Signed: 07/08/2022 4:48:17 PM By: Geralyn Corwin DO Entered By: Geralyn Corwin on 07/08/2022 16:00:46 -------------------------------------------------------------------------------- Progress Note Details Patient Name: Date of Service: Samuel Starch G. 07/08/2022 3:30 PM Medical Record Number: [id number] Patient Account Number: [id number] Date of Birth/Sex: Treating RN: [birth year] (52 y.o. M) Primary Care Provider: Bertram Denver Other Clinician: Referring Provider: Treating Provider/Extender: Altamese Dilling Weeks in Treatment: 7 Subjective Chief Complaint Information obtained from Patient patient is here for review of wounds on his right medial lower leg and right lateral foot 06/27/17; patient returns to clinic  today with a reopening on his bilateral medial lower calfs 05/14/2022; bilateral lower extremity wounds History of Present Illness (HPI) 05/14/2022 Mr. Samuel [name] is a 52 year old male with a past medical history of venous insufficiency, COPD and OSA that presents to the clinic for a 37-month history of nonhealing ulcers to his legs bilaterally. He has compression stockings but has not been wearing them. He has been keeping the areas covered. He states he has a history of wounds that wax and wane in healing. He currently denies signs of infection. 4/22; patient presents for follow-up. The wraps had slid down over the past week. We have been using Hydrofera Blue  under 3 layer compression to the lower extremities bilaterally. Adding this he has no issues or complaints. Overall the wounds are smaller. The left lower extremity wounds have healed. 5/3; patient presents for follow-up. He missed his last clinic appointment and has had the wrap on for 2 weeks. He knows to not keep this on for more than a week. We have been using Hydrofera Blue under 3 layer compression. The right lateral foot wound has healed. 5/13; patient presents for follow-up. We have been using Hydrofera Blue under 3 layer compression to the right lower extremity. His wound is smaller. 5/20; Patient presents for follow up. We have been using hydrofera blue under 3 layer compression to the right lower extremity. The wound is smaller. He has no issues or complaints today. 5/28; patient presents for follow-up. We have been using Hydrofera Blue under 3 layer compression to the right lower extremity. Wound is smaller. 6/3; patient has a small open wound on the right anterior lower leg in the setting of severe chronic venous insufficiency we have been using 3 layer compression. Hydrofera Blue on the wound. 6/10; patient presents for follow-up. We have been using Hydrofera Blue under 3 layer compression. His wound has healed. He has  compression stockings at home. Patient History Information obtained from Patient. Family History Hypertension - Mother, Lung Disease - Mother, No family history of Cancer, Diabetes, Heart Disease, Hereditary Spherocytosis, Kidney Disease, Seizures, Stroke, Thyroid Problems, Tuberculosis. 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 Medical History Eyes Denies history of Cataracts, Glaucoma, Optic Neuritis Ear/Nose/Mouth/Throat Denies history of Chronic sinus problems/congestion, Middle ear problems Hematologic/Lymphatic Patient has history of Lymphedema Denies history of Anemia, Hemophilia, Human Immunodeficiency Virus, Sickle Cell Disease Respiratory Patient has history of Asthma, Chronic Obstructive Pulmonary Disease (COPD) Denies history of Aspiration, Pneumothorax, Sleep Apnea, Tuberculosis Cardiovascular Patient has history of Hypertension, Peripheral Venous Disease Denies history of Angina, Arrhythmia, Congestive Heart Failure, Coronary Artery Disease, Deep Vein Thrombosis, Hypotension, Myocardial Infarction, Peripheral Arterial Disease, Phlebitis, Vasculitis Gastrointestinal Denies history of Cirrhosis , Colitis, Crohns, Hepatitis A, Hepatitis B, Hepatitis C Endocrine Denies history of Type I Diabetes, Type II Diabetes Genitourinary Denies history of End Stage Renal Disease Immunological Denies history of Lupus Erythematosus, Raynauds, Scleroderma Integumentary (Skin) Denies history of History of Burn Musculoskeletal Denies history of Gout, Rheumatoid Arthritis, Osteoarthritis, Osteomyelitis Neurologic Denies history of Dementia, Neuropathy, Quadriplegia, Paraplegia, Seizure Disorder Oncologic Denies history of Received Chemotherapy, Received  Radiation Psychiatric Patient has history of Confinement Anxiety Denies history of Anorexia/bulimia Medical A Surgical History Notes nd Constitutional Symptoms (General Health) morbid obesity Objective Constitutional respirations regular, non-labored and within target range for patient.. Vitals Time Taken: 3:37 PM, Height: 70 in, Weight: 370 lbs, BMI: 53.1, Temperature: 97.9 F, Pulse: 112 bpm, Respiratory Rate: 18 breaths/min, Blood Pressure: 165/95 mmHg. Cardiovascular 2+ dorsalis pedis/posterior tibialis pulses. Psychiatric pleasant and cooperative. General Notes: Epithelization to the previous wound site to the right lower extremity. Decent edema control. Integumentary (Hair, Skin) Wound #7 status is Healed - Epithelialized. Original cause of wound was Gradually Appeared. The date acquired was: 11/12/2021. The wound has been in treatment 7 weeks. The wound is located on the Right,Medial Lower Leg. The wound measures 0cm length x 0cm width x 0cm depth; 0cm^2 area and 0cm^3 volume. There is Fat Layer (Subcutaneous Tissue) exposed. There  is no tunneling or undermining noted. There is a small amount of serosanguineous drainage noted. The wound margin is distinct with the outline attached to the wound base. There is large (67-100%) pink, pale granulation within the wound bed. There is no necrotic tissue within the wound bed. The periwound skin appearance exhibited: Scarring. The periwound skin appearance did not exhibit: Callus, Crepitus, Excoriation, Induration, Rash, Dry/Scaly, Maceration, Atrophie Blanche, Cyanosis, Ecchymosis, Hemosiderin Staining, Mottled, Pallor, Rubor, Erythema. Assessment Active Problems ICD-10 Non-pressure chronic ulcer of other part of right lower leg with fat layer exposed Non-pressure chronic ulcer of other part of left lower leg with fat layer exposed Chronic venous hypertension (idiopathic) with ulcer of bilateral lower extremity Other specified chronic  obstructive pulmonary disease Samuel [name], Samuel [name] ([id number]) [id number] Page 5 of 7 Patient has done well with Hydrofera Blue under compression wraps. His wound is healed. I recommended wearing compression stockings daily. He has these at home and we gave Tubigrip in office. We will have him follow-up in 2 weeks to assure that the wound did not reopen. Plan Follow-up Appointments: Return Appointment in 2 weeks. - Dr Mikey Bussing for preventative visit. Congratulations on healing!! Wear Tubigrip home then switch to compression. Other: - bring in your compression stockings next week. Anesthetic: (In clinic) Topical Lidocaine 4% applied to wound bed Bathing/ Shower/ Hygiene: May shower with protection but do not get wound dressing(s) wet. Protect dressing(s) with water repellant cover (for example, large plastic bag) or a cast cover and may then take shower. Edema Control - Lymphedema / SCD / Other: Elevate legs to the level of the heart or above for 30 minutes daily and/or when sitting for 3-4 times a day throughout the day. Avoid standing for long periods of time. Exercise regularly If compression wraps slide down please call wound center and speak with a nurse. 1. Follow-up in 2 weeks 2. Compression stockings daily Electronic Signature(s) Signed: 07/09/2022 7:51:08 AM By: Shawn Stall RN, BSN Signed: 07/09/2022 3:34:20 PM By: Geralyn Corwin DO Previous Signature: 07/08/2022 4:48:17 PM Version By: Geralyn Corwin DO Entered By: Shawn Stall on 07/09/2022 07:40:25 -------------------------------------------------------------------------------- HxROS Details Patient Name: Date of Service: Samuel [name]. 07/08/2022 3:30 PM Medical Record Number: [id number] Patient Account Number: [id number] Date of Birth/Sex: Treating RN: [birth year] (52 y.o. M) Primary Care Provider: Bertram Denver Other Clinician: Referring Provider: Treating Provider/Extender: Altamese Dilling Weeks in Treatment: 7 Information Obtained From Patient Constitutional Symptoms (General Health) Medical History: Past Medical History Notes: morbid obesity Eyes Medical History: Negative for: Cataracts; Glaucoma; Optic Neuritis Ear/Nose/Mouth/Throat Medical History: Negative for: Chronic sinus problems/congestion; Middle ear problems Hematologic/Lymphatic Medical History: Positive for: Lymphedema Negative for: Anemia; Hemophilia; Human Immunodeficiency Virus; Sickle Cell Disease Respiratory Medical History: Positive for: Asthma; Chronic Obstructive Pulmonary Disease (COPD) Negative for: Aspiration; Pneumothorax; Sleep Apnea; Tuberculosis Samuel [name], Samuel [name] ([id number]) [id number] Page 6 of 7 Cardiovascular Medical History: Positive for: Hypertension; Peripheral Venous Disease Negative for: Angina; Arrhythmia; Congestive Heart Failure; Coronary Artery Disease; Deep Vein Thrombosis; Hypotension; Myocardial Infarction; Peripheral Arterial Disease; Phlebitis; Vasculitis Gastrointestinal Medical History: Negative for: Cirrhosis ; Colitis; Crohns; Hepatitis A; Hepatitis B; Hepatitis C Endocrine Medical History: Negative for: Type I Diabetes; Type II Diabetes Genitourinary Medical History: Negative for: End Stage Renal Disease Immunological Medical History: Negative for: Lupus Erythematosus; Raynauds; Scleroderma Integumentary (Skin) Medical History: Negative for: History of Burn Musculoskeletal Medical History: Negative for: Gout; Rheumatoid Arthritis; Osteoarthritis; Osteomyelitis Neurologic Medical History: Negative for: Dementia; Neuropathy; Quadriplegia; Paraplegia; Seizure Disorder Oncologic Medical  History: Negative for: Received Chemotherapy; Received Radiation Psychiatric Medical History: Positive for: Confinement Anxiety Negative for: Anorexia/bulimia Immunizations Pneumococcal Vaccine: Received  Pneumococcal Vaccination: Yes Received Pneumococcal Vaccination On or After 60th Birthday: Yes Tetanus Vaccine: Last tetanus shot: 02/08/2014 Implantable Devices None Family and Social History Cancer: No; Diabetes: No; Heart Disease: No; Hereditary Spherocytosis: No; Hypertension: Yes - Mother; Kidney Disease: No; Lung Disease: Yes - Mother; Seizures: No; Stroke: No; Thyroid Problems: No; Tuberculosis: No; Former smoker - quit 11 yrs ago; Marital Status - Single; Alcohol Use: Rarely; Drug Use: Current History - TCH; Caffeine Use: Moderate - coffee; Financial Concerns: No; Food, Clothing or Shelter Needs: No; Support System Lacking: No; Transportation Concerns: No Electronic Signature(s) Signed: 07/08/2022 4:48:17 PM By: Geralyn Corwin DO Entered By: Geralyn Corwin on 07/08/2022 16:01:28 Amedeo Gory ([id number]) [id number] Page 7 of 7 -------------------------------------------------------------------------------- SuperBill Details Patient Name: Date of Service: [name] 07/08/2022 Medical Record Number: [id number] Patient Account Number: [id number] Date of Birth/Sex: Treating RN: January 09, 1971 (52 y.o. Yates Decamp Primary Care Provider: Bertram Denver Other Clinician: Referring Provider: Treating Provider/Extender: Altamese Dilling Weeks in Treatment: 7 Diagnosis Coding ICD-10 Codes Code Description [phone] Non-pressure chronic ulcer of other part of right lower leg with fat layer exposed L97.822 Non-pressure chronic ulcer of other part of left lower leg with fat layer exposed I87.313 Chronic venous hypertension (idiopathic) with ulcer of bilateral lower extremity J44.89 Other specified chronic obstructive pulmonary disease Facility Procedures : CPT4 Code: [id number] Description: 84696 - WOUND CARE VISIT-LEV 2 EST PT Modifier: Quantity: 1 Physician Procedures : CPT4 Code Description Modifier [id number] - WC  PHYS LEVEL 3 - EST PT ICD-10 Diagnosis Description L97.812 Non-pressure chronic ulcer of other part of right lower leg with fat layer exposed L97.822 Non-pressure chronic ulcer of other part of left  lower leg with fat layer exposed I87.313 Chronic venous hypertension (idiopathic) with ulcer of bilateral lower extremity J44.89 Other specified chronic obstructive pulmonary disease Quantity: 1 Electronic Signature(s) Signed: 07/08/2022 4:48:17 PM By: Geralyn Corwin DO Entered By: Geralyn Corwin on 07/08/2022 16:04:24

## 2022-07-15 ENCOUNTER — Inpatient Hospital Stay: Admission: RE | Admit: 2022-07-15 | Payer: 59 | Source: Ambulatory Visit

## 2022-07-15 ENCOUNTER — Ambulatory Visit (HOSPITAL_BASED_OUTPATIENT_CLINIC_OR_DEPARTMENT_OTHER): Payer: 59 | Admitting: Internal Medicine

## 2022-07-16 ENCOUNTER — Other Ambulatory Visit: Payer: Self-pay

## 2022-07-22 ENCOUNTER — Encounter (HOSPITAL_BASED_OUTPATIENT_CLINIC_OR_DEPARTMENT_OTHER): Payer: 59 | Admitting: Internal Medicine

## 2022-07-23 ENCOUNTER — Telehealth: Payer: Self-pay

## 2022-07-23 DIAGNOSIS — M7989 Other specified soft tissue disorders: Secondary | ICD-10-CM

## 2022-07-23 NOTE — Telephone Encounter (Signed)
Pt called, unclear msg.  Pt walked into office, filled out triage form, form given to triage RN.  Pt's measurements not in chart, pt placed in exam room, and measurements taken. Pt asked to purchase 4 pair of compression stockings.

## 2022-07-25 ENCOUNTER — Other Ambulatory Visit: Payer: Self-pay

## 2022-07-26 ENCOUNTER — Other Ambulatory Visit: Payer: Self-pay

## 2022-08-23 ENCOUNTER — Ambulatory Visit: Payer: 59 | Admitting: Dietician

## 2022-08-30 ENCOUNTER — Other Ambulatory Visit: Payer: Self-pay

## 2022-08-30 ENCOUNTER — Other Ambulatory Visit: Payer: Self-pay | Admitting: Nurse Practitioner

## 2022-08-30 DIAGNOSIS — J4521 Mild intermittent asthma with (acute) exacerbation: Secondary | ICD-10-CM

## 2022-08-30 DIAGNOSIS — J449 Chronic obstructive pulmonary disease, unspecified: Secondary | ICD-10-CM

## 2022-08-30 MED ORDER — IPRATROPIUM-ALBUTEROL 0.5-2.5 (3) MG/3ML IN SOLN
3.0000 mL | Freq: Four times a day (QID) | RESPIRATORY_TRACT | 0 refills | Status: DC | PRN
  Filled 2022-08-30: qty 360, 30d supply, fill #0

## 2022-08-30 MED ORDER — ALBUTEROL SULFATE HFA 108 (90 BASE) MCG/ACT IN AERS
2.0000 | INHALATION_SPRAY | Freq: Four times a day (QID) | RESPIRATORY_TRACT | 2 refills | Status: DC | PRN
  Filled 2022-08-30: qty 6.7, 25d supply, fill #0
  Filled 2022-10-02: qty 6.7, 25d supply, fill #1
  Filled 2022-11-11: qty 6.7, 25d supply, fill #2

## 2022-10-02 ENCOUNTER — Other Ambulatory Visit: Payer: Self-pay | Admitting: Family Medicine

## 2022-10-02 ENCOUNTER — Other Ambulatory Visit: Payer: Self-pay

## 2022-10-02 ENCOUNTER — Other Ambulatory Visit: Payer: Self-pay | Admitting: Physician Assistant

## 2022-10-02 DIAGNOSIS — N529 Male erectile dysfunction, unspecified: Secondary | ICD-10-CM

## 2022-10-02 DIAGNOSIS — J449 Chronic obstructive pulmonary disease, unspecified: Secondary | ICD-10-CM

## 2022-10-02 MED ORDER — IPRATROPIUM-ALBUTEROL 0.5-2.5 (3) MG/3ML IN SOLN
3.0000 mL | Freq: Four times a day (QID) | RESPIRATORY_TRACT | 0 refills | Status: DC | PRN
  Filled 2022-10-02: qty 360, 30d supply, fill #0

## 2022-10-02 MED ORDER — SILDENAFIL CITRATE 100 MG PO TABS
100.0000 mg | ORAL_TABLET | ORAL | 1 refills | Status: DC | PRN
  Filled 2022-10-02: qty 10, 30d supply, fill #0
  Filled 2022-11-11: qty 10, 30d supply, fill #1

## 2022-10-07 ENCOUNTER — Other Ambulatory Visit: Payer: Self-pay

## 2022-10-07 DIAGNOSIS — I10 Essential (primary) hypertension: Secondary | ICD-10-CM | POA: Diagnosis not present

## 2022-10-07 DIAGNOSIS — Z6841 Body Mass Index (BMI) 40.0 and over, adult: Secondary | ICD-10-CM | POA: Diagnosis not present

## 2022-10-07 DIAGNOSIS — Z125 Encounter for screening for malignant neoplasm of prostate: Secondary | ICD-10-CM | POA: Diagnosis not present

## 2022-10-07 DIAGNOSIS — D649 Anemia, unspecified: Secondary | ICD-10-CM | POA: Diagnosis not present

## 2022-10-07 DIAGNOSIS — E291 Testicular hypofunction: Secondary | ICD-10-CM | POA: Diagnosis not present

## 2022-10-07 DIAGNOSIS — R7303 Prediabetes: Secondary | ICD-10-CM | POA: Diagnosis not present

## 2022-10-07 DIAGNOSIS — E559 Vitamin D deficiency, unspecified: Secondary | ICD-10-CM | POA: Diagnosis not present

## 2022-10-14 DIAGNOSIS — D649 Anemia, unspecified: Secondary | ICD-10-CM | POA: Diagnosis not present

## 2022-10-14 DIAGNOSIS — E559 Vitamin D deficiency, unspecified: Secondary | ICD-10-CM | POA: Diagnosis not present

## 2022-10-14 DIAGNOSIS — E291 Testicular hypofunction: Secondary | ICD-10-CM | POA: Diagnosis not present

## 2022-10-14 DIAGNOSIS — I10 Essential (primary) hypertension: Secondary | ICD-10-CM | POA: Diagnosis not present

## 2022-10-14 DIAGNOSIS — J45909 Unspecified asthma, uncomplicated: Secondary | ICD-10-CM | POA: Diagnosis not present

## 2022-10-14 DIAGNOSIS — R6882 Decreased libido: Secondary | ICD-10-CM | POA: Diagnosis not present

## 2022-10-14 DIAGNOSIS — Z6841 Body Mass Index (BMI) 40.0 and over, adult: Secondary | ICD-10-CM | POA: Diagnosis not present

## 2022-10-14 DIAGNOSIS — R7303 Prediabetes: Secondary | ICD-10-CM | POA: Diagnosis not present

## 2022-10-14 DIAGNOSIS — Z1331 Encounter for screening for depression: Secondary | ICD-10-CM | POA: Diagnosis not present

## 2022-10-14 DIAGNOSIS — G479 Sleep disorder, unspecified: Secondary | ICD-10-CM | POA: Diagnosis not present

## 2022-10-30 DIAGNOSIS — Z6841 Body Mass Index (BMI) 40.0 and over, adult: Secondary | ICD-10-CM | POA: Diagnosis not present

## 2022-10-30 DIAGNOSIS — E291 Testicular hypofunction: Secondary | ICD-10-CM | POA: Diagnosis not present

## 2022-10-30 DIAGNOSIS — I10 Essential (primary) hypertension: Secondary | ICD-10-CM | POA: Diagnosis not present

## 2022-11-07 DIAGNOSIS — E291 Testicular hypofunction: Secondary | ICD-10-CM | POA: Diagnosis not present

## 2022-11-07 DIAGNOSIS — R748 Abnormal levels of other serum enzymes: Secondary | ICD-10-CM | POA: Diagnosis not present

## 2022-11-07 DIAGNOSIS — Z6841 Body Mass Index (BMI) 40.0 and over, adult: Secondary | ICD-10-CM | POA: Diagnosis not present

## 2022-11-11 ENCOUNTER — Telehealth: Payer: Self-pay

## 2022-11-11 ENCOUNTER — Other Ambulatory Visit: Payer: Self-pay | Admitting: Nurse Practitioner

## 2022-11-11 ENCOUNTER — Other Ambulatory Visit: Payer: Self-pay

## 2022-11-11 DIAGNOSIS — J449 Chronic obstructive pulmonary disease, unspecified: Secondary | ICD-10-CM

## 2022-11-11 NOTE — Telephone Encounter (Signed)
Returned pt's call regarding purchasing another pain of compression hose. His VM is full and I was unable to LVM.

## 2022-11-12 ENCOUNTER — Other Ambulatory Visit: Payer: Self-pay

## 2022-11-12 MED ORDER — IPRATROPIUM-ALBUTEROL 0.5-2.5 (3) MG/3ML IN SOLN
3.0000 mL | Freq: Four times a day (QID) | RESPIRATORY_TRACT | 0 refills | Status: DC | PRN
  Filled 2022-11-12: qty 360, 30d supply, fill #0

## 2022-11-13 DIAGNOSIS — E291 Testicular hypofunction: Secondary | ICD-10-CM | POA: Diagnosis not present

## 2022-11-13 DIAGNOSIS — Z6841 Body Mass Index (BMI) 40.0 and over, adult: Secondary | ICD-10-CM | POA: Diagnosis not present

## 2022-11-14 ENCOUNTER — Other Ambulatory Visit: Payer: Self-pay

## 2022-11-20 DIAGNOSIS — R600 Localized edema: Secondary | ICD-10-CM | POA: Diagnosis not present

## 2022-11-20 DIAGNOSIS — Z87891 Personal history of nicotine dependence: Secondary | ICD-10-CM | POA: Diagnosis not present

## 2022-11-20 DIAGNOSIS — E559 Vitamin D deficiency, unspecified: Secondary | ICD-10-CM | POA: Diagnosis not present

## 2022-11-20 DIAGNOSIS — Z8249 Family history of ischemic heart disease and other diseases of the circulatory system: Secondary | ICD-10-CM | POA: Diagnosis not present

## 2022-11-20 DIAGNOSIS — I87339 Chronic venous hypertension (idiopathic) with ulcer and inflammation of unspecified lower extremity: Secondary | ICD-10-CM | POA: Diagnosis not present

## 2022-11-20 DIAGNOSIS — Z833 Family history of diabetes mellitus: Secondary | ICD-10-CM | POA: Diagnosis not present

## 2022-11-20 DIAGNOSIS — Z008 Encounter for other general examination: Secondary | ICD-10-CM | POA: Diagnosis not present

## 2022-11-20 DIAGNOSIS — Z6841 Body Mass Index (BMI) 40.0 and over, adult: Secondary | ICD-10-CM | POA: Diagnosis not present

## 2022-11-20 DIAGNOSIS — L97902 Non-pressure chronic ulcer of unspecified part of unspecified lower leg with fat layer exposed: Secondary | ICD-10-CM | POA: Diagnosis not present

## 2022-11-20 DIAGNOSIS — Z79811 Long term (current) use of aromatase inhibitors: Secondary | ICD-10-CM | POA: Diagnosis not present

## 2022-11-20 DIAGNOSIS — R7303 Prediabetes: Secondary | ICD-10-CM | POA: Diagnosis not present

## 2022-11-20 DIAGNOSIS — L97901 Non-pressure chronic ulcer of unspecified part of unspecified lower leg limited to breakdown of skin: Secondary | ICD-10-CM | POA: Diagnosis not present

## 2022-11-20 DIAGNOSIS — Z7951 Long term (current) use of inhaled steroids: Secondary | ICD-10-CM | POA: Diagnosis not present

## 2022-11-20 DIAGNOSIS — E291 Testicular hypofunction: Secondary | ICD-10-CM | POA: Diagnosis not present

## 2022-11-20 DIAGNOSIS — I10 Essential (primary) hypertension: Secondary | ICD-10-CM | POA: Diagnosis not present

## 2022-11-22 ENCOUNTER — Ambulatory Visit: Payer: 59 | Admitting: Nurse Practitioner

## 2022-11-27 DIAGNOSIS — E559 Vitamin D deficiency, unspecified: Secondary | ICD-10-CM | POA: Diagnosis not present

## 2022-11-27 DIAGNOSIS — E291 Testicular hypofunction: Secondary | ICD-10-CM | POA: Diagnosis not present

## 2022-11-27 DIAGNOSIS — Z6841 Body Mass Index (BMI) 40.0 and over, adult: Secondary | ICD-10-CM | POA: Diagnosis not present

## 2022-12-04 DIAGNOSIS — E291 Testicular hypofunction: Secondary | ICD-10-CM | POA: Diagnosis not present

## 2022-12-11 DIAGNOSIS — E559 Vitamin D deficiency, unspecified: Secondary | ICD-10-CM | POA: Diagnosis not present

## 2022-12-11 DIAGNOSIS — E291 Testicular hypofunction: Secondary | ICD-10-CM | POA: Diagnosis not present

## 2022-12-11 DIAGNOSIS — Z6841 Body Mass Index (BMI) 40.0 and over, adult: Secondary | ICD-10-CM | POA: Diagnosis not present

## 2022-12-18 DIAGNOSIS — E291 Testicular hypofunction: Secondary | ICD-10-CM | POA: Diagnosis not present

## 2022-12-20 ENCOUNTER — Other Ambulatory Visit: Payer: Self-pay

## 2022-12-20 ENCOUNTER — Other Ambulatory Visit: Payer: Self-pay | Admitting: Family Medicine

## 2022-12-20 DIAGNOSIS — J449 Chronic obstructive pulmonary disease, unspecified: Secondary | ICD-10-CM

## 2022-12-20 DIAGNOSIS — J4521 Mild intermittent asthma with (acute) exacerbation: Secondary | ICD-10-CM

## 2022-12-20 DIAGNOSIS — N529 Male erectile dysfunction, unspecified: Secondary | ICD-10-CM

## 2022-12-24 ENCOUNTER — Other Ambulatory Visit: Payer: Self-pay

## 2022-12-25 ENCOUNTER — Other Ambulatory Visit: Payer: Self-pay

## 2023-01-01 DIAGNOSIS — R748 Abnormal levels of other serum enzymes: Secondary | ICD-10-CM | POA: Diagnosis not present

## 2023-01-01 DIAGNOSIS — Z79890 Hormone replacement therapy (postmenopausal): Secondary | ICD-10-CM | POA: Diagnosis not present

## 2023-01-01 DIAGNOSIS — D649 Anemia, unspecified: Secondary | ICD-10-CM | POA: Diagnosis not present

## 2023-01-01 DIAGNOSIS — G479 Sleep disorder, unspecified: Secondary | ICD-10-CM | POA: Diagnosis not present

## 2023-01-01 DIAGNOSIS — R7303 Prediabetes: Secondary | ICD-10-CM | POA: Diagnosis not present

## 2023-01-01 DIAGNOSIS — R6882 Decreased libido: Secondary | ICD-10-CM | POA: Diagnosis not present

## 2023-01-01 DIAGNOSIS — Z6841 Body Mass Index (BMI) 40.0 and over, adult: Secondary | ICD-10-CM | POA: Diagnosis not present

## 2023-01-01 DIAGNOSIS — I10 Essential (primary) hypertension: Secondary | ICD-10-CM | POA: Diagnosis not present

## 2023-01-01 DIAGNOSIS — E559 Vitamin D deficiency, unspecified: Secondary | ICD-10-CM | POA: Diagnosis not present

## 2023-01-01 DIAGNOSIS — J45909 Unspecified asthma, uncomplicated: Secondary | ICD-10-CM | POA: Diagnosis not present

## 2023-01-01 DIAGNOSIS — E291 Testicular hypofunction: Secondary | ICD-10-CM | POA: Diagnosis not present

## 2023-01-13 DIAGNOSIS — E291 Testicular hypofunction: Secondary | ICD-10-CM | POA: Diagnosis not present

## 2023-01-13 DIAGNOSIS — Z6841 Body Mass Index (BMI) 40.0 and over, adult: Secondary | ICD-10-CM | POA: Diagnosis not present

## 2023-01-13 DIAGNOSIS — D649 Anemia, unspecified: Secondary | ICD-10-CM | POA: Diagnosis not present

## 2023-01-13 DIAGNOSIS — E559 Vitamin D deficiency, unspecified: Secondary | ICD-10-CM | POA: Diagnosis not present

## 2023-02-03 ENCOUNTER — Encounter: Payer: Self-pay | Admitting: Nurse Practitioner

## 2023-02-03 ENCOUNTER — Other Ambulatory Visit: Payer: Self-pay

## 2023-02-03 ENCOUNTER — Ambulatory Visit: Payer: 59 | Attending: Nurse Practitioner | Admitting: Nurse Practitioner

## 2023-02-03 VITALS — BP 152/82 | HR 98 | Resp 20 | Ht 70.0 in | Wt 394.0 lb

## 2023-02-03 DIAGNOSIS — I10 Essential (primary) hypertension: Secondary | ICD-10-CM | POA: Diagnosis not present

## 2023-02-03 DIAGNOSIS — R000 Tachycardia, unspecified: Secondary | ICD-10-CM | POA: Diagnosis not present

## 2023-02-03 DIAGNOSIS — Z6841 Body Mass Index (BMI) 40.0 and over, adult: Secondary | ICD-10-CM | POA: Diagnosis not present

## 2023-02-03 DIAGNOSIS — Z0001 Encounter for general adult medical examination with abnormal findings: Secondary | ICD-10-CM

## 2023-02-03 DIAGNOSIS — E785 Hyperlipidemia, unspecified: Secondary | ICD-10-CM

## 2023-02-03 DIAGNOSIS — J449 Chronic obstructive pulmonary disease, unspecified: Secondary | ICD-10-CM

## 2023-02-03 DIAGNOSIS — N529 Male erectile dysfunction, unspecified: Secondary | ICD-10-CM

## 2023-02-03 DIAGNOSIS — Z7985 Long-term (current) use of injectable non-insulin antidiabetic drugs: Secondary | ICD-10-CM | POA: Diagnosis not present

## 2023-02-03 DIAGNOSIS — R7303 Prediabetes: Secondary | ICD-10-CM

## 2023-02-03 DIAGNOSIS — E66813 Obesity, class 3: Secondary | ICD-10-CM | POA: Diagnosis not present

## 2023-02-03 DIAGNOSIS — J4520 Mild intermittent asthma, uncomplicated: Secondary | ICD-10-CM

## 2023-02-03 DIAGNOSIS — J4521 Mild intermittent asthma with (acute) exacerbation: Secondary | ICD-10-CM

## 2023-02-03 DIAGNOSIS — Z23 Encounter for immunization: Secondary | ICD-10-CM

## 2023-02-03 DIAGNOSIS — Z0000 Encounter for general adult medical examination without abnormal findings: Secondary | ICD-10-CM

## 2023-02-03 MED ORDER — IPRATROPIUM-ALBUTEROL 0.5-2.5 (3) MG/3ML IN SOLN
3.0000 mL | Freq: Four times a day (QID) | RESPIRATORY_TRACT | 1 refills | Status: DC | PRN
  Filled 2023-02-03 – 2023-04-22 (×2): qty 360, 30d supply, fill #0

## 2023-02-03 MED ORDER — LOSARTAN POTASSIUM-HCTZ 100-25 MG PO TABS
1.0000 | ORAL_TABLET | Freq: Every day | ORAL | 1 refills | Status: DC
  Filled 2023-02-03: qty 30, 30d supply, fill #0
  Filled 2023-04-04 (×2): qty 30, 30d supply, fill #1
  Filled 2023-04-22 – 2023-05-20 (×2): qty 30, 30d supply, fill #2
  Filled 2023-08-08: qty 30, 30d supply, fill #3

## 2023-02-03 MED ORDER — FLUTICASONE FUROATE-VILANTEROL 100-25 MCG/ACT IN AEPB
1.0000 | INHALATION_SPRAY | Freq: Every day | RESPIRATORY_TRACT | 6 refills | Status: DC
  Filled 2023-02-03 – 2023-02-19 (×2): qty 60, 30d supply, fill #0
  Filled 2023-04-04 – 2023-04-23 (×5): qty 60, 30d supply, fill #1

## 2023-02-03 MED ORDER — METOPROLOL SUCCINATE ER 50 MG PO TB24
50.0000 mg | ORAL_TABLET | Freq: Every day | ORAL | 1 refills | Status: DC
  Filled 2023-02-03 (×2): qty 30, 30d supply, fill #0
  Filled 2023-04-04 (×2): qty 30, 30d supply, fill #1
  Filled 2023-05-20: qty 30, 30d supply, fill #2
  Filled 2023-08-08: qty 30, 30d supply, fill #3

## 2023-02-03 MED ORDER — ALBUTEROL SULFATE HFA 108 (90 BASE) MCG/ACT IN AERS
2.0000 | INHALATION_SPRAY | Freq: Four times a day (QID) | RESPIRATORY_TRACT | 3 refills | Status: DC | PRN
Start: 2023-02-03 — End: 2023-05-20
  Filled 2023-02-03: qty 6.7, 25d supply, fill #0
  Filled 2023-02-19: qty 6.7, 25d supply, fill #1
  Filled 2023-04-04 (×2): qty 6.7, 25d supply, fill #2
  Filled 2023-04-22 – 2023-04-24 (×2): qty 6.7, 25d supply, fill #3

## 2023-02-03 MED ORDER — SILDENAFIL CITRATE 100 MG PO TABS
100.0000 mg | ORAL_TABLET | ORAL | 3 refills | Status: DC | PRN
  Filled 2023-02-03: qty 10, 30d supply, fill #0
  Filled 2023-04-04 (×2): qty 10, 30d supply, fill #1
  Filled 2023-04-22: qty 10, 30d supply, fill #2
  Filled 2023-08-08: qty 10, 30d supply, fill #3

## 2023-02-03 NOTE — Progress Notes (Signed)
 Assessment & Plan:  Samuel [name] was seen today for annual exam.  Diagnoses and all orders for this visit:  Encounter for annual physical exam  Mild intermittent asthma without complication -     albuterol  (PROVENTIL  HFA) 108 (90 Base) MCG/ACT inhaler; Inhale 2 puffs into the lungs every 6 (six) hours as needed for wheezing or shortness of breath. -     ipratropium-albuterol  (DUONEB) 0.5-2.5 (3) MG/3ML SOLN; Take 3 mLs by nebulization every 6 (six) hours as needed. -     fluticasone  furoate-vilanterol (BREO ELLIPTA ) 100-25 MCG/ACT AEPB; Inhale 1 puff into the lungs daily.  Prediabetes -     Urine Albumin/Creatinine with ratio (send out) [LAB689] -     Hemoglobin A1c -     losartan -hydrochlorothiazide  (HYZAAR ) 100-25 MG tablet; Take 1 tablet by mouth daily. -     metoprolol  succinate (TOPROL -XL) 50 MG 24 hr tablet; Take 1 tablet (50 mg total) by mouth daily. -     CMP14+EGFR  Essential hypertension -     losartan -hydrochlorothiazide  (HYZAAR ) 100-25 MG tablet; Take 1 tablet by mouth daily. -     metoprolol  succinate (TOPROL -XL) 50 MG 24 hr tablet; Take 1 tablet (50 mg total) by mouth daily. -     CMP14+EGFR Continue all antihypertensives as prescribed.  Reminded to bring in blood pressure log for follow  up appointment.  RECOMMENDATIONS: DASH/Mediterranean Diets are healthier choices for HTN.    Tachycardia HR normal  -     metoprolol  succinate (TOPROL -XL) 50 MG 24 hr tablet; Take 1 tablet (50 mg total) by mouth daily. -     CBC with Differential  Erectile dysfunction, unspecified erectile dysfunction type -     sildenafil  (VIAGRA ) 100 MG tablet; Take 1 tablet (100 mg total) by mouth as needed for erectile dysfunction.  Morbid obesity with BMI of 50.0-59.9, adult [facility]) [facility] is [facility]. Taking compound semaglutide  and reports minimal effectiveness with weight loss -     Amb Referral to Bariatric Surgery  Dyslipidemia, goal LDL below 70 -     Lipid  panel INSTRUCTIONS: Work on a low fat, heart healthy diet and participate in regular aerobic exercise program by working out at least 150 minutes per week; 5 days a week-30 minutes per day. Avoid red meat/beef/steak,  fried foods. junk foods, sodas, sugary drinks, unhealthy snacking, alcohol and smoking.  Drink at least 80 oz of water per day and monitor your carbohydrate intake daily.    Encounter for immunization -     Flu vaccine trivalent PF, 6mos and older(Flulaval,Afluria,Fluarix,Fluzone)    Patient has been counseled on age-appropriate routine health concerns for screening and prevention. These are reviewed and up-to-date. Referrals have been placed accordingly. Immunizations are up-to-date or declined.    Subjective:   Chief Complaint  Patient presents with   Annual Exam    Samuel [name] 53 y.o. male presents to office today for annual physical exam.   BP Readings from Last 3 Encounters:  02/03/23 (!) 152/82  06/17/22 130/82  05/29/22 128/87    HPI  Review of Systems  Constitutional:  Negative for fever, malaise/fatigue and weight loss.  HENT: Negative.  Negative for nosebleeds.   Eyes: Negative.  Negative for blurred vision, double vision and photophobia.  Respiratory: Negative.  Negative for cough and shortness of breath.   Cardiovascular: Negative.  Negative for chest pain, palpitations and leg swelling.  Gastrointestinal: Negative.  Negative for heartburn, nausea and vomiting.  Genitourinary: Negative.  Musculoskeletal: Negative.  Negative for myalgias.  Skin: Negative.   Neurological: Negative.  Negative for dizziness, focal weakness, seizures and headaches.  Endo/Heme/Allergies: Negative.   Psychiatric/Behavioral: Negative.  Negative for suicidal ideas.     Past Medical History:  Diagnosis Date   Asthma    Hypertension    Prediabetes    Ulcer    venous insufficiency    History reviewed. No pertinent surgical history.  Family History  Problem  Relation Age of Onset   Asthma Mother    Hypertension Mother     Social History Reviewed with no changes to be made today.   Outpatient Medications Prior to Visit  Medication Sig Dispense Refill   fluticasone  furoate-vilanterol (BREO ELLIPTA ) 100-25 MCG/ACT AEPB Inhale 1 puff into the lungs daily. 60 each 6   ipratropium-albuterol  (DUONEB) 0.5-2.5 (3) MG/3ML SOLN TAKE 3 MLS BY NEBULIZATION EVERY 6 (SIX) HOURS AS NEEDED FOR SHORTNESS OF BREATH 360 mL 0   losartan -hydrochlorothiazide  (HYZAAR ) 100-25 MG tablet Take 1 tablet by mouth daily. 90 tablet 1   metoprolol  succinate (TOPROL -XL) 50 MG 24 hr tablet Take 1 tablet (50 mg total) by mouth daily. 90 tablet 1   sildenafil  (VIAGRA ) 100 MG tablet Take 1 tablet (100 mg total) by mouth as needed for erectile dysfunction. 10 tablet 1   albuterol  (PROVENTIL  HFA) 108 (90 Base) MCG/ACT inhaler Inhale 2 puffs into the lungs every 6 (six) hours as needed for wheezing or shortness of breath. (Patient not taking: Reported on 02/03/2023) 6.7 g 2   Dulaglutide  (TRULICITY ) 1.5 MG/0.5ML SOAJ Inject 1.5 mg into the skin once a week. (Patient not taking: Reported on 02/03/2023) 6 mL 3   No facility-administered medications prior to visit.    Allergies  Allergen Reactions   Shrimp [Shellfish Allergy]     Lip swell up.        Objective:    BP (!) 152/82   Pulse 98   Resp 20   Ht 5' 10 (1.778 m)   Wt (!) 394 lb (178.7 kg) Comment: [facility] weight 377 lbs.  SpO2 95%   BMI 56.53 kg/m  Wt Readings from Last 3 Encounters:  02/03/23 (!) 394 lb (178.7 kg)  06/17/22 (!) 377 lb (171 kg)  05/29/22 (!) 377 lb (171 kg)    Physical Exam Constitutional:      Appearance: He is well-developed. He is obese.  HENT:     Head: Normocephalic and atraumatic.     Right Ear: Hearing, tympanic membrane, ear canal and external ear normal.     Left Ear: Hearing, tympanic membrane, ear canal and external ear normal.     Nose: Nose normal. No mucosal edema or  rhinorrhea.     Right Turbinates: Not enlarged.     Left Turbinates: Not enlarged.     Mouth/Throat:     Lips: Pink.     Mouth: Mucous membranes are moist.     Dentition: No gingival swelling, dental abscesses or gum lesions.     Pharynx: Uvula midline.     Tonsils: No tonsillar exudate. 1+ on the right. 1+ on the left.  Eyes:     General: Lids are normal. No scleral icterus.    Extraocular Movements: Extraocular movements intact.     Conjunctiva/sclera: Conjunctivae normal.     Pupils: Pupils are equal, round, and reactive to light.  Neck:     Thyroid : No thyromegaly.     Trachea: No tracheal deviation.  Cardiovascular:  Rate and Rhythm: Normal rate and regular rhythm.     Heart sounds: Normal heart sounds. No murmur heard.    No friction rub. No gallop.  Pulmonary:     Effort: Pulmonary effort is normal. No respiratory distress.     Breath sounds: Normal breath sounds. No wheezing or rales.  Chest:     Chest wall: No mass or tenderness.  Breasts:    Right: No inverted nipple, mass, nipple discharge, skin change or tenderness.     Left: No inverted nipple, mass, nipple discharge, skin change or tenderness.  Abdominal:     General: Bowel sounds are normal. There is no distension.     Palpations: Abdomen is soft. There is no mass.     Tenderness: There is no abdominal tenderness. There is no guarding or rebound.  Musculoskeletal:        General: No tenderness or deformity. Normal range of motion.     Cervical back: Normal range of motion and neck supple.  Lymphadenopathy:     Cervical: No cervical adenopathy.  Skin:    General: Skin is warm and dry.     Capillary Refill: Capillary refill takes less than 2 seconds.     Findings: No erythema.  Neurological:     Mental Status: He is alert and oriented to person, place, and time.     Cranial Nerves: No cranial nerve deficit.     Sensory: Sensation is intact.     Motor: No abnormal muscle tone.     Coordination:  Coordination is intact. Coordination normal.     Gait: Gait is intact.     Deep Tendon Reflexes: Reflexes normal.     Reflex Scores:      Patellar reflexes are 1+ on the right side and 1+ on the left side. Psychiatric:        Attention and Perception: Attention normal.        Mood and Affect: Mood normal.        Speech: Speech normal.        Behavior: Behavior normal.        Thought Content: Thought content normal.        Judgment: Judgment normal.          Patient has been counseled extensively about nutrition and exercise as well as the importance of adherence with medications and regular follow-up. The patient was given clear instructions to go to ER or return to medical center if symptoms don't improve, worsen or new problems develop. The patient verbalized understanding.   Follow-up: Return in about 6 months (around 08/03/2023).   Samuel LELON Servant, FNP-BC [facility] and [facility] [place], [place] [phone]   02/04/2023, 8:58 AM

## 2023-02-04 ENCOUNTER — Other Ambulatory Visit: Payer: Self-pay | Admitting: Nurse Practitioner

## 2023-02-04 ENCOUNTER — Encounter: Payer: Self-pay | Admitting: Nurse Practitioner

## 2023-02-04 DIAGNOSIS — Z1211 Encounter for screening for malignant neoplasm of colon: Secondary | ICD-10-CM

## 2023-02-04 LAB — CBC WITH DIFFERENTIAL/PLATELET
Basophils Absolute: 0 10*3/uL (ref 0.0–0.2)
Basos: 0 %
EOS (ABSOLUTE): 0.1 10*3/uL (ref 0.0–0.4)
Eos: 2 %
Hematocrit: 49.9 % (ref 37.5–51.0)
Hemoglobin: 15.4 g/dL (ref 13.0–17.7)
Immature Grans (Abs): 0 10*3/uL (ref 0.0–0.1)
Immature Granulocytes: 1 %
Lymphocytes Absolute: 1.9 10*3/uL (ref 0.7–3.1)
Lymphs: 24 %
MCH: 24.9 pg — ABNORMAL LOW (ref 26.6–33.0)
MCHC: 30.9 g/dL — ABNORMAL LOW (ref 31.5–35.7)
MCV: 81 fL (ref 79–97)
Monocytes Absolute: 1 10*3/uL — ABNORMAL HIGH (ref 0.1–0.9)
Monocytes: 13 %
Neutrophils Absolute: 4.8 10*3/uL (ref 1.4–7.0)
Neutrophils: 60 %
Platelets: 390 10*3/uL (ref 150–450)
RBC: 6.19 x10E6/uL — ABNORMAL HIGH (ref 4.14–5.80)
RDW: 15.6 % — ABNORMAL HIGH (ref 11.6–15.4)
WBC: 7.8 10*3/uL (ref 3.4–10.8)

## 2023-02-04 LAB — MICROALBUMIN / CREATININE URINE RATIO
Creatinine, Urine: 245.1 mg/dL
Microalb/Creat Ratio: 5 mg/g{creat} (ref 0–29)
Microalbumin, Urine: 12.7 ug/mL

## 2023-02-04 LAB — CMP14+EGFR
ALT: 16 [IU]/L (ref 0–44)
AST: 20 [IU]/L (ref 0–40)
Albumin: 3.9 g/dL — ABNORMAL LOW (ref 4.1–5.1)
Alkaline Phosphatase: 71 [IU]/L (ref 44–121)
BUN/Creatinine Ratio: 7 — ABNORMAL LOW (ref 9–20)
BUN: 11 mg/dL (ref 6–24)
Bilirubin Total: 0.4 mg/dL (ref 0.0–1.2)
CO2: 24 mmol/L (ref 20–29)
Calcium: 9 mg/dL (ref 8.7–10.2)
Chloride: 102 mmol/L (ref 96–106)
Creatinine, Ser: 1.48 mg/dL — ABNORMAL HIGH (ref 0.76–1.27)
Globulin, Total: 2.9 g/dL (ref 1.5–4.5)
Glucose: 82 mg/dL (ref 70–99)
Potassium: 4.6 mmol/L (ref 3.5–5.2)
Sodium: 140 mmol/L (ref 134–144)
Total Protein: 6.8 g/dL (ref 6.0–8.5)
eGFR: 57 mL/min/{1.73_m2} — ABNORMAL LOW (ref >59)

## 2023-02-04 LAB — LIPID PANEL
Chol/HDL Ratio: 4.6 {ratio} (ref 0.0–5.0)
Cholesterol, Total: 151 mg/dL (ref 100–199)
HDL: 33 mg/dL — ABNORMAL LOW (ref >39)
LDL Chol Calc (NIH): 93 mg/dL (ref 0–99)
Triglycerides: 138 mg/dL (ref 0–149)
VLDL Cholesterol Cal: 25 mg/dL (ref 5–40)

## 2023-02-04 LAB — HEMOGLOBIN A1C
Est. average glucose Bld gHb Est-mCnc: 123 mg/dL
Hgb A1c MFr Bld: 5.9 % — ABNORMAL HIGH (ref 4.8–5.6)

## 2023-02-13 ENCOUNTER — Other Ambulatory Visit: Payer: Self-pay

## 2023-02-13 DIAGNOSIS — Z008 Encounter for other general examination: Secondary | ICD-10-CM | POA: Diagnosis not present

## 2023-02-18 ENCOUNTER — Encounter: Payer: Self-pay | Admitting: Nurse Practitioner

## 2023-02-19 ENCOUNTER — Other Ambulatory Visit: Payer: Self-pay

## 2023-02-20 ENCOUNTER — Other Ambulatory Visit: Payer: Self-pay

## 2023-04-04 ENCOUNTER — Other Ambulatory Visit (HOSPITAL_COMMUNITY): Payer: Self-pay

## 2023-04-04 ENCOUNTER — Other Ambulatory Visit: Payer: Self-pay

## 2023-04-13 ENCOUNTER — Encounter: Payer: Self-pay | Admitting: Nurse Practitioner

## 2023-04-23 ENCOUNTER — Other Ambulatory Visit: Payer: Self-pay | Admitting: Pharmacist

## 2023-04-23 ENCOUNTER — Other Ambulatory Visit: Payer: Self-pay

## 2023-04-23 MED ORDER — FLUTICASONE-SALMETEROL 250-50 MCG/ACT IN AEPB
1.0000 | INHALATION_SPRAY | Freq: Two times a day (BID) | RESPIRATORY_TRACT | 3 refills | Status: DC
  Filled 2023-04-23 – 2023-04-24 (×2): qty 60, 30d supply, fill #0
  Filled 2023-05-20: qty 60, 30d supply, fill #1
  Filled 2023-08-08: qty 60, 30d supply, fill #2
  Filled 2023-09-12: qty 60, 30d supply, fill #3

## 2023-04-24 ENCOUNTER — Other Ambulatory Visit: Payer: Self-pay

## 2023-05-02 ENCOUNTER — Other Ambulatory Visit: Payer: Self-pay

## 2023-05-14 ENCOUNTER — Encounter (HOSPITAL_BASED_OUTPATIENT_CLINIC_OR_DEPARTMENT_OTHER): Payer: Self-pay | Attending: Internal Medicine | Admitting: Internal Medicine

## 2023-05-14 DIAGNOSIS — L97828 Non-pressure chronic ulcer of other part of left lower leg with other specified severity: Secondary | ICD-10-CM | POA: Insufficient documentation

## 2023-05-14 DIAGNOSIS — I890 Lymphedema, not elsewhere classified: Secondary | ICD-10-CM | POA: Insufficient documentation

## 2023-05-14 DIAGNOSIS — L97818 Non-pressure chronic ulcer of other part of right lower leg with other specified severity: Secondary | ICD-10-CM | POA: Insufficient documentation

## 2023-05-20 ENCOUNTER — Other Ambulatory Visit: Payer: Self-pay | Admitting: Nurse Practitioner

## 2023-05-20 DIAGNOSIS — J4520 Mild intermittent asthma, uncomplicated: Secondary | ICD-10-CM

## 2023-05-21 ENCOUNTER — Other Ambulatory Visit: Payer: Self-pay

## 2023-05-21 MED ORDER — ALBUTEROL SULFATE HFA 108 (90 BASE) MCG/ACT IN AERS
2.0000 | INHALATION_SPRAY | Freq: Four times a day (QID) | RESPIRATORY_TRACT | 2 refills | Status: DC | PRN
  Filled 2023-05-21: qty 6.7, 25d supply, fill #0
  Filled 2023-06-13 (×2): qty 6.7, 25d supply, fill #1
  Filled 2023-07-11: qty 6.7, 25d supply, fill #2

## 2023-05-23 ENCOUNTER — Other Ambulatory Visit: Payer: Self-pay

## 2023-05-23 ENCOUNTER — Encounter (HOSPITAL_BASED_OUTPATIENT_CLINIC_OR_DEPARTMENT_OTHER): Payer: Self-pay | Admitting: Internal Medicine

## 2023-05-28 ENCOUNTER — Ambulatory Visit (HOSPITAL_BASED_OUTPATIENT_CLINIC_OR_DEPARTMENT_OTHER): Admitting: Internal Medicine

## 2023-05-30 ENCOUNTER — Encounter (HOSPITAL_BASED_OUTPATIENT_CLINIC_OR_DEPARTMENT_OTHER): Payer: Self-pay | Attending: Internal Medicine | Admitting: Internal Medicine

## 2023-05-30 DIAGNOSIS — I87311 Chronic venous hypertension (idiopathic) with ulcer of right lower extremity: Secondary | ICD-10-CM

## 2023-05-30 DIAGNOSIS — L97828 Non-pressure chronic ulcer of other part of left lower leg with other specified severity: Secondary | ICD-10-CM | POA: Insufficient documentation

## 2023-05-30 DIAGNOSIS — G4733 Obstructive sleep apnea (adult) (pediatric): Secondary | ICD-10-CM | POA: Insufficient documentation

## 2023-05-30 DIAGNOSIS — I890 Lymphedema, not elsewhere classified: Secondary | ICD-10-CM | POA: Insufficient documentation

## 2023-05-30 DIAGNOSIS — I87312 Chronic venous hypertension (idiopathic) with ulcer of left lower extremity: Secondary | ICD-10-CM

## 2023-05-30 DIAGNOSIS — I87313 Chronic venous hypertension (idiopathic) with ulcer of bilateral lower extremity: Secondary | ICD-10-CM | POA: Insufficient documentation

## 2023-05-30 DIAGNOSIS — L97818 Non-pressure chronic ulcer of other part of right lower leg with other specified severity: Secondary | ICD-10-CM | POA: Insufficient documentation

## 2023-05-30 DIAGNOSIS — J449 Chronic obstructive pulmonary disease, unspecified: Secondary | ICD-10-CM | POA: Insufficient documentation

## 2023-06-05 ENCOUNTER — Encounter (HOSPITAL_BASED_OUTPATIENT_CLINIC_OR_DEPARTMENT_OTHER): Payer: Self-pay | Admitting: Internal Medicine

## 2023-06-09 ENCOUNTER — Encounter (HOSPITAL_BASED_OUTPATIENT_CLINIC_OR_DEPARTMENT_OTHER): Payer: Self-pay | Admitting: Internal Medicine

## 2023-06-09 DIAGNOSIS — I87311 Chronic venous hypertension (idiopathic) with ulcer of right lower extremity: Secondary | ICD-10-CM

## 2023-06-09 DIAGNOSIS — L97818 Non-pressure chronic ulcer of other part of right lower leg with other specified severity: Secondary | ICD-10-CM

## 2023-06-09 DIAGNOSIS — L97828 Non-pressure chronic ulcer of other part of left lower leg with other specified severity: Secondary | ICD-10-CM

## 2023-06-09 DIAGNOSIS — I87312 Chronic venous hypertension (idiopathic) with ulcer of left lower extremity: Secondary | ICD-10-CM

## 2023-06-13 ENCOUNTER — Other Ambulatory Visit (HOSPITAL_BASED_OUTPATIENT_CLINIC_OR_DEPARTMENT_OTHER): Payer: Self-pay

## 2023-06-13 ENCOUNTER — Other Ambulatory Visit: Payer: Self-pay

## 2023-06-17 ENCOUNTER — Encounter (HOSPITAL_BASED_OUTPATIENT_CLINIC_OR_DEPARTMENT_OTHER): Payer: Self-pay | Admitting: Internal Medicine

## 2023-06-17 DIAGNOSIS — I87311 Chronic venous hypertension (idiopathic) with ulcer of right lower extremity: Secondary | ICD-10-CM

## 2023-06-17 DIAGNOSIS — L97818 Non-pressure chronic ulcer of other part of right lower leg with other specified severity: Secondary | ICD-10-CM

## 2023-06-17 DIAGNOSIS — I87312 Chronic venous hypertension (idiopathic) with ulcer of left lower extremity: Secondary | ICD-10-CM

## 2023-06-17 DIAGNOSIS — L97828 Non-pressure chronic ulcer of other part of left lower leg with other specified severity: Secondary | ICD-10-CM

## 2023-06-26 ENCOUNTER — Encounter (HOSPITAL_BASED_OUTPATIENT_CLINIC_OR_DEPARTMENT_OTHER): Payer: Self-pay | Admitting: Internal Medicine

## 2023-06-26 DIAGNOSIS — I87311 Chronic venous hypertension (idiopathic) with ulcer of right lower extremity: Secondary | ICD-10-CM

## 2023-06-26 DIAGNOSIS — L97818 Non-pressure chronic ulcer of other part of right lower leg with other specified severity: Secondary | ICD-10-CM

## 2023-07-03 ENCOUNTER — Encounter (HOSPITAL_BASED_OUTPATIENT_CLINIC_OR_DEPARTMENT_OTHER): Payer: Self-pay | Attending: Internal Medicine | Admitting: Internal Medicine

## 2023-07-03 DIAGNOSIS — I87311 Chronic venous hypertension (idiopathic) with ulcer of right lower extremity: Secondary | ICD-10-CM | POA: Insufficient documentation

## 2023-07-03 DIAGNOSIS — I890 Lymphedema, not elsewhere classified: Secondary | ICD-10-CM | POA: Insufficient documentation

## 2023-07-09 ENCOUNTER — Encounter (HOSPITAL_BASED_OUTPATIENT_CLINIC_OR_DEPARTMENT_OTHER): Payer: Self-pay | Admitting: Internal Medicine

## 2023-07-09 DIAGNOSIS — L97818 Non-pressure chronic ulcer of other part of right lower leg with other specified severity: Secondary | ICD-10-CM

## 2023-07-09 DIAGNOSIS — I890 Lymphedema, not elsewhere classified: Secondary | ICD-10-CM

## 2023-07-09 DIAGNOSIS — I87311 Chronic venous hypertension (idiopathic) with ulcer of right lower extremity: Secondary | ICD-10-CM

## 2023-07-11 ENCOUNTER — Other Ambulatory Visit: Payer: Self-pay | Admitting: Nurse Practitioner

## 2023-07-11 ENCOUNTER — Other Ambulatory Visit: Payer: Self-pay

## 2023-07-11 DIAGNOSIS — J4520 Mild intermittent asthma, uncomplicated: Secondary | ICD-10-CM

## 2023-07-11 NOTE — Telephone Encounter (Signed)
 Copied from CRM [id number]. Topic: Clinical - Medication Refill >> Jul 11, 2023  4:08 PM Ameerah G wrote: Medication: albuterol  (PROVENTIL  HFA) 108 (90 Base) MCG/ACT inhaler [[id number]]  Has the patient contacted their pharmacy? Yes (Agent: If no, request that the patient contact the pharmacy for the refill. If patient does not wish to contact the pharmacy document the reason why and proceed with request.) (Agent: If yes, when and what did the pharmacy advise?)  This is the patient's preferred pharmacy:  [facility] [address] [place] [address] Phone: [phone] Fax: [phone]   Is this the correct pharmacy for this prescription? Yes If no, delete pharmacy and type the correct one.   Has the prescription been filled recently? No  Is the patient out of the medication? Yes  Has the patient been seen for an appointment in the last year OR does the patient have an upcoming appointment? Yes  Can we respond through MyChart? Yes  Agent: Please be advised that Rx refills may take up to 3 business days. We ask that you follow-up with your pharmacy.

## 2023-07-14 ENCOUNTER — Other Ambulatory Visit: Payer: Self-pay

## 2023-07-14 MED ORDER — ALBUTEROL SULFATE HFA 108 (90 BASE) MCG/ACT IN AERS
2.0000 | INHALATION_SPRAY | Freq: Four times a day (QID) | RESPIRATORY_TRACT | 2 refills | Status: DC | PRN
  Filled 2023-07-14 – 2023-08-08 (×2): qty 6.7, 25d supply, fill #0
  Filled 2023-08-27: qty 6.7, 25d supply, fill #1
  Filled 2023-09-12: qty 6.7, 25d supply, fill #2

## 2023-07-14 NOTE — Telephone Encounter (Signed)
 Requested Prescriptions  Pending Prescriptions Disp Refills   albuterol  (PROVENTIL  HFA) 108 (90 Base) MCG/ACT inhaler 6.7 g 2    Sig: Inhale 2 puffs into the lungs every 6 (six) hours as needed for wheezing or shortness of breath.     Pulmonology:  Beta Agonists 2 Failed - 07/14/2023  4:43 PM      Failed - Last BP in normal range    BP Readings from Last 1 Encounters:  02/03/23 (!) 152/82         Passed - Last Heart Rate in normal range    Pulse Readings from Last 1 Encounters:  02/03/23 98         Passed - Valid encounter within last 12 months    Recent Outpatient Visits           5 months ago Encounter for annual physical exam   [facility] [place]. [facility] Collins Dean, NP   1 year ago Prostate cancer screening   [facility] [place]. [facility] [place], [place], [place]   1 year ago Prediabetes   [facility] Of [place]. [facility] Collins Dean, NP   2 years ago Prediabetes   [facility] Of [place]. [facility] Collins Dean, NP   2 years ago Need for immunization against influenza    [facility] [place]. [facility] [place], [place], [place]       Future Appointments             In 3 weeks Collins Dean, NP [facility] - [facility] Of [place]. [facility]

## 2023-07-16 ENCOUNTER — Encounter (HOSPITAL_BASED_OUTPATIENT_CLINIC_OR_DEPARTMENT_OTHER): Payer: Self-pay | Admitting: Internal Medicine

## 2023-07-16 DIAGNOSIS — L97818 Non-pressure chronic ulcer of other part of right lower leg with other specified severity: Secondary | ICD-10-CM

## 2023-07-16 DIAGNOSIS — I890 Lymphedema, not elsewhere classified: Secondary | ICD-10-CM

## 2023-07-16 DIAGNOSIS — I87311 Chronic venous hypertension (idiopathic) with ulcer of right lower extremity: Secondary | ICD-10-CM

## 2023-07-23 ENCOUNTER — Encounter (HOSPITAL_BASED_OUTPATIENT_CLINIC_OR_DEPARTMENT_OTHER): Payer: Self-pay | Admitting: Internal Medicine

## 2023-07-23 DIAGNOSIS — L97818 Non-pressure chronic ulcer of other part of right lower leg with other specified severity: Secondary | ICD-10-CM

## 2023-07-23 DIAGNOSIS — I87311 Chronic venous hypertension (idiopathic) with ulcer of right lower extremity: Secondary | ICD-10-CM

## 2023-07-23 DIAGNOSIS — I890 Lymphedema, not elsewhere classified: Secondary | ICD-10-CM

## 2023-07-29 ENCOUNTER — Encounter (HOSPITAL_BASED_OUTPATIENT_CLINIC_OR_DEPARTMENT_OTHER): Payer: Self-pay | Attending: Internal Medicine | Admitting: Internal Medicine

## 2023-07-29 DIAGNOSIS — I890 Lymphedema, not elsewhere classified: Secondary | ICD-10-CM | POA: Insufficient documentation

## 2023-07-29 DIAGNOSIS — I87311 Chronic venous hypertension (idiopathic) with ulcer of right lower extremity: Secondary | ICD-10-CM | POA: Insufficient documentation

## 2023-07-29 DIAGNOSIS — L97818 Non-pressure chronic ulcer of other part of right lower leg with other specified severity: Secondary | ICD-10-CM | POA: Insufficient documentation

## 2023-08-04 ENCOUNTER — Ambulatory Visit: Payer: 59 | Admitting: Nurse Practitioner

## 2023-08-07 ENCOUNTER — Encounter (HOSPITAL_BASED_OUTPATIENT_CLINIC_OR_DEPARTMENT_OTHER): Payer: Self-pay | Admitting: Internal Medicine

## 2023-08-07 DIAGNOSIS — I87311 Chronic venous hypertension (idiopathic) with ulcer of right lower extremity: Secondary | ICD-10-CM

## 2023-08-07 DIAGNOSIS — L97818 Non-pressure chronic ulcer of other part of right lower leg with other specified severity: Secondary | ICD-10-CM

## 2023-08-07 DIAGNOSIS — I890 Lymphedema, not elsewhere classified: Secondary | ICD-10-CM

## 2023-08-08 ENCOUNTER — Other Ambulatory Visit: Payer: Self-pay

## 2023-08-15 ENCOUNTER — Encounter (HOSPITAL_BASED_OUTPATIENT_CLINIC_OR_DEPARTMENT_OTHER): Payer: Self-pay | Admitting: Internal Medicine

## 2023-08-21 ENCOUNTER — Encounter (HOSPITAL_BASED_OUTPATIENT_CLINIC_OR_DEPARTMENT_OTHER): Payer: Self-pay | Admitting: Internal Medicine

## 2023-08-27 ENCOUNTER — Other Ambulatory Visit: Payer: Self-pay

## 2023-08-28 ENCOUNTER — Other Ambulatory Visit: Payer: Self-pay

## 2023-09-01 ENCOUNTER — Telehealth: Payer: Self-pay | Admitting: Nurse Practitioner

## 2023-09-01 NOTE — Telephone Encounter (Signed)
 Called patient to confirm upcoming appointment 09/02/2023. Unable to reach patient or leave VM.

## 2023-09-02 ENCOUNTER — Encounter: Payer: Self-pay | Admitting: Nurse Practitioner

## 2023-09-02 ENCOUNTER — Other Ambulatory Visit: Payer: Self-pay

## 2023-09-02 ENCOUNTER — Ambulatory Visit: Payer: Self-pay | Attending: Nurse Practitioner | Admitting: Nurse Practitioner

## 2023-09-02 VITALS — BP 134/74 | HR 100 | Resp 20 | Ht 70.0 in | Wt 381.6 lb

## 2023-09-02 DIAGNOSIS — R7989 Other specified abnormal findings of blood chemistry: Secondary | ICD-10-CM

## 2023-09-02 DIAGNOSIS — I10 Essential (primary) hypertension: Secondary | ICD-10-CM

## 2023-09-02 DIAGNOSIS — F5101 Primary insomnia: Secondary | ICD-10-CM

## 2023-09-02 DIAGNOSIS — E559 Vitamin D deficiency, unspecified: Secondary | ICD-10-CM

## 2023-09-02 DIAGNOSIS — E7800 Pure hypercholesterolemia, unspecified: Secondary | ICD-10-CM

## 2023-09-02 DIAGNOSIS — R7303 Prediabetes: Secondary | ICD-10-CM

## 2023-09-02 DIAGNOSIS — Z6841 Body Mass Index (BMI) 40.0 and over, adult: Secondary | ICD-10-CM

## 2023-09-02 DIAGNOSIS — R000 Tachycardia, unspecified: Secondary | ICD-10-CM

## 2023-09-02 DIAGNOSIS — Z1211 Encounter for screening for malignant neoplasm of colon: Secondary | ICD-10-CM

## 2023-09-02 MED ORDER — VITAMIN D (ERGOCALCIFEROL) 1.25 MG (50000 UNIT) PO CAPS
50000.0000 [IU] | ORAL_CAPSULE | ORAL | 1 refills | Status: AC
  Filled 2023-09-02 – 2023-09-12 (×2): qty 12, 84d supply, fill #0
  Filled 2023-12-08: qty 12, 84d supply, fill #1

## 2023-09-02 MED ORDER — METOPROLOL SUCCINATE ER 50 MG PO TB24
50.0000 mg | ORAL_TABLET | Freq: Every day | ORAL | 1 refills | Status: DC
  Filled 2023-09-02 – 2023-09-12 (×2): qty 90, 90d supply, fill #0
  Filled 2023-12-08: qty 90, 90d supply, fill #1

## 2023-09-02 MED ORDER — TRAZODONE HCL 50 MG PO TABS
25.0000 mg | ORAL_TABLET | Freq: Every evening | ORAL | 3 refills | Status: AC | PRN
  Filled 2023-09-02 – 2023-09-12 (×2): qty 30, 30d supply, fill #0
  Filled 2023-12-08: qty 30, 30d supply, fill #1

## 2023-09-02 MED ORDER — LOSARTAN POTASSIUM-HCTZ 100-25 MG PO TABS
1.0000 | ORAL_TABLET | Freq: Every day | ORAL | 1 refills | Status: DC
  Filled 2023-09-02 – 2023-09-12 (×2): qty 90, 90d supply, fill #0
  Filled 2023-12-08: qty 90, 90d supply, fill #1

## 2023-09-02 NOTE — Progress Notes (Signed)
Weight loss medication.

## 2023-09-02 NOTE — Progress Notes (Signed)
 Assessment & Plan:  Samuel [name] was seen today for hypertension and weight loss.  Diagnoses and all orders for this visit:  Primary hypertension -     CMP14+EGFR -     losartan -hydrochlorothiazide  (HYZAAR ) 100-25 MG tablet; Take 1 tablet by mouth daily. -     metoprolol  succinate (TOPROL -XL) 50 MG 24 hr tablet; Take 1 tablet (50 mg total) by mouth daily. Continue all antihypertensives as prescribed.  Reminded to bring in blood pressure log for follow  up appointment.  RECOMMENDATIONS: DASH/Mediterranean Diets are healthier choices for HTN.    Prediabetes -     CMP14+EGFR -     Hemoglobin A1c -     losartan -hydrochlorothiazide  (HYZAAR ) 100-25 MG tablet; Take 1 tablet by mouth daily. -     metoprolol  succinate (TOPROL -XL) 50 MG 24 hr tablet; Take 1 tablet (50 mg total) by mouth daily.  Tachycardia -     metoprolol  succinate (TOPROL -XL) 50 MG 24 hr tablet; Take 1 tablet (50 mg total) by mouth daily. -     Thyroid  Panel With TSH  Colon cancer screening -     Fecal occult blood, imunochemical([facility])  Morbid obesity with BMI of 50.0-59.9, adult (HCC) -     Amb Referral to Bariatric Surgery -     Testosterone ,Free and Total Obesity is a significant concern. Insurance coverage for weight loss medications is limited. Bariatric surgery is a viable alternative with better insurance coverage. - Refer to bariatric center for consultation and potential surgery. - Ensure sleep study is conducted if bariatric surgery is pursued to rule out sleep apnea.  Low testosterone  in male -     Testosterone ,Free and Total  Hypercholesterolemia -     Lipid panel INSTRUCTIONS: Work on a low fat, heart healthy diet and participate in regular aerobic exercise program by working out at least 150 minutes per week; 5 days a week-30 minutes per day. Avoid red meat/beef/steak,  fried foods. junk foods, sodas, sugary drinks, unhealthy snacking, alcohol and smoking.  Drink at least 80 oz of water per day  and monitor your carbohydrate intake daily.    Vitamin D  deficiency disease -     Vitamin D , Ergocalciferol , (DRISDOL ) 1.25 MG (50000 UNIT) CAPS capsule; Take 1 capsule (50,000 Units total) by mouth once a week.  Primary insomnia -     traZODone  (DESYREL ) 50 MG tablet; Take 0.5-1 tablets (25-50 mg total) by mouth at bedtime as needed for sleep.     Tachycardia Heart rate consistently around 100 bpm. On metoprolol  50 mg daily. Concern for heart strain. Lack of sleep or OSA may contribute. - Monitor heart rate and consider increasing metoprolol  if heart rate exceeds 105 bpm. - Refer to cardiology for further evaluation once insurance is sorted. - Check thyroid  function as it has not been checked in six years.  Insomnia Reports difficulty sleeping. Lack of sleep may contribute to elevated heart rate and overall health issues. - Prescribe trazodone  for sleep as needed.  Testosterone  deficiency Low testosterone  levels previously treated with injections. Reports decreased energy and erectile dysfunction since stopping injections. - Check testosterone  levels. - Refer to urology for testosterone  injections if levels are low and insurance covers it. Reports gynecomastia, possibly related to hormonal imbalances. Reports erectile dysfunction, possibly related to low testosterone  levels.  Prediabetes A1c levels well-managed. Does not qualify for weight loss medication coverage. - Check A1c levels.  Vitamin D  deficiency Low vitamin D  levels. - Prescribe vitamin D  supplementation to be taken once a week.  General Health Maintenance Requires routine health maintenance, including colon cancer screening. - Provide stool kit for colon cancer screening.  Patient has been counseled on age-appropriate routine health concerns for screening and prevention. These are reviewed and up-to-date. Referrals have been placed accordingly. Immunizations are up-to-date or declined.    Subjective:   Chief  Complaint  Patient presents with   Hypertension   Weight Loss    Would like to try a medication for weight loss.     History of Present Illness Samuel [name] is a 53 year old male who presents for HTN, a bariatric referral and evaluation of low testosterone .  Samuel [name] is seeking a referral for bariatric surgery after previous attempts to manage his weight with medications such as Trulicity  and Ozempic  were unsuccessful due to lack of insurance coverage. Samuel [name] is exploring insurance options that might cover weight loss medications or surgery. BMI 54.75  Samuel [name] has a history of low testosterone , identified during a previous evaluation at [facility], with levels described as 'real, real low' and high estrogen levels. Samuel [name] was treated with testosterone  injections, which improved his energy levels and mood, but Samuel [name] stopped due to cost. Samuel [name] still has some vials of testosterone  left and has noticed decreased energy since stopping the treatment.  Samuel [name] mentions having a hernia that requires surgical intervention, but Samuel [name] was advised to lose weight before proceeding with surgery. Samuel [name] has not had any surgeries in the past. Samuel [name] experiences occasional snoring and has been told by others that Samuel [name] snores. Samuel [name] denies knowing if Samuel [name] has sleep apnea.  Samuel [name] is currently taking metoprolol  50 mg daily. His heart rate is often around 100 bpm, and Samuel [name] reports not getting enough sleep, which Samuel [name] acknowledges may affect his heart rate. Samuel [name] does not take any sleep aids but is open to considering them.  Samuel [name] has a history of prediabetes, but his A1c levels have remained within a good range. Samuel [name] has not been able to qualify for weight loss medications based on his A1c levels.  Samuel [name] reports a history of low vitamin D  and iron levels, for which Samuel [name] has been treated in the past. Samuel [name] also mentions having been given estrogen blockers previously.   Review of Systems  Constitutional:  Positive for malaise/fatigue. Negative for fever and weight loss.  HENT:  Negative.  Negative for nosebleeds.   Eyes: Negative.  Negative for blurred vision, double vision and photophobia.  Respiratory: Negative.  Negative for cough and shortness of breath.   Cardiovascular: Negative.  Negative for chest pain, palpitations and leg swelling.  Gastrointestinal: Negative.  Negative for heartburn, nausea and vomiting.  Musculoskeletal: Negative.  Negative for myalgias.  Neurological: Negative.  Negative for dizziness, focal weakness, seizures and headaches.  Psychiatric/Behavioral:  Negative for suicidal ideas. The patient is nervous/anxious.     Past Medical History:  Diagnosis Date   Asthma    Hypertension    Prediabetes    Ulcer    venous insufficiency    History reviewed. No pertinent surgical history.  Family History  Problem Relation Age of Onset   Asthma Mother    Hypertension Mother     Social History Reviewed with no changes to be made today.   Outpatient Medications Prior to Visit  Medication Sig Dispense Refill   albuterol  (PROVENTIL  HFA) 108 (90 Base) MCG/ACT inhaler Inhale 2 puffs into the lungs every 6 (six) hours as needed for wheezing or shortness of breath. 6.7 g 2   fluticasone -salmeterol (ADVAIR ) 250-50 MCG/ACT AEPB  Inhale 1 puff into the lungs in the morning and at bedtime. 60 each 3   ipratropium-albuterol  (DUONEB) 0.5-2.5 (3) MG/3ML SOLN Take 3 mLs by nebulization every 6 (six) hours as needed. 360 mL 1   sildenafil  (VIAGRA ) 100 MG tablet Take 1 tablet (100 mg total) by mouth as needed for erectile dysfunction. 10 tablet 3   losartan -hydrochlorothiazide  (HYZAAR ) 100-25 MG tablet Take 1 tablet by mouth daily. 90 tablet 1   metoprolol  succinate (TOPROL -XL) 50 MG 24 hr tablet Take 1 tablet (50 mg total) by mouth daily. 90 tablet 1   No facility-administered medications prior to visit.    Allergies  Allergen Reactions   Shrimp [Shellfish Allergy]     Lip swell up.        Objective:    BP 134/74 (BP Location: Left Arm, Patient  Position: Sitting, Cuff Size: Large)   Pulse 100   Resp 20   Ht 5' 10 (1.778 m)   Wt (!) 381 lb 9.6 oz (173.1 kg)   SpO2 100%   BMI 54.75 kg/m  Wt Readings from Last 3 Encounters:  09/02/23 (!) 381 lb 9.6 oz (173.1 kg)  02/03/23 (!) 394 lb (178.7 kg)  06/17/22 (!) 377 lb (171 kg)    Physical Exam Vitals and nursing note reviewed.  Constitutional:      Appearance: Samuel [name] is well-developed. Samuel [name] is obese.  HENT:     Head: Normocephalic and atraumatic.  Cardiovascular:     Rate and Rhythm: Normal rate and regular rhythm.     Heart sounds: Normal heart sounds. No murmur heard.    No friction rub. No gallop.  Pulmonary:     Effort: Pulmonary effort is normal. No tachypnea or respiratory distress.     Breath sounds: Normal breath sounds. No decreased breath sounds, wheezing, rhonchi or rales.  Chest:     Chest wall: No tenderness.  Abdominal:     General: Bowel sounds are normal.     Palpations: Abdomen is soft.  Musculoskeletal:        General: Normal range of motion.     Cervical back: Normal range of motion.  Skin:    General: Skin is warm and dry.  Neurological:     Mental Status: Samuel [name] is alert and oriented to person, place, and time.     Coordination: Coordination normal.  Psychiatric:        Behavior: Behavior normal. Behavior is cooperative.        Thought Content: Thought content normal.        Judgment: Judgment normal.          Patient has been counseled extensively about nutrition and exercise as well as the importance of adherence with medications and regular follow-up. The patient was given clear instructions to go to ER or return to medical center if symptoms don't improve, worsen or new problems develop. The patient verbalized understanding.   Follow-up: Return in about 3 months (around 12/03/2023).   Haze LELON Servant, FNP-BC [facility] and [facility] [place], [place] [phone]   09/02/2023, 2:37 PM

## 2023-09-06 ENCOUNTER — Ambulatory Visit: Payer: Self-pay | Admitting: Nurse Practitioner

## 2023-09-06 LAB — CMP14+EGFR
ALT: 18 IU/L (ref 0–44)
AST: 16 IU/L (ref 0–40)
Albumin: 4.1 g/dL (ref 3.8–4.9)
Alkaline Phosphatase: 77 IU/L (ref 44–121)
BUN/Creatinine Ratio: 7 — ABNORMAL LOW (ref 9–20)
BUN: 8 mg/dL (ref 6–24)
Bilirubin Total: 0.5 mg/dL (ref 0.0–1.2)
CO2: 23 mmol/L (ref 20–29)
Calcium: 9.4 mg/dL (ref 8.7–10.2)
Chloride: 98 mmol/L (ref 96–106)
Creatinine, Ser: 1.08 mg/dL (ref 0.76–1.27)
Globulin, Total: 3.3 g/dL (ref 1.5–4.5)
Glucose: 95 mg/dL (ref 70–99)
Potassium: 4.1 mmol/L (ref 3.5–5.2)
Sodium: 137 mmol/L (ref 134–144)
Total Protein: 7.4 g/dL (ref 6.0–8.5)
eGFR: 83 mL/min/1.73 (ref >59)

## 2023-09-06 LAB — HEMOGLOBIN A1C
Est. average glucose Bld gHb Est-mCnc: 128 mg/dL
Hgb A1c MFr Bld: 6.1 % — ABNORMAL HIGH (ref 4.8–5.6)

## 2023-09-06 LAB — THYROID PANEL WITH TSH
Free Thyroxine Index: 2.7 (ref 1.2–4.9)
T3 Uptake Ratio: 29 % (ref 24–39)
T4, Total: 9.4 ug/dL (ref 4.5–12.0)
TSH: 0.938 u[IU]/mL (ref 0.450–4.500)

## 2023-09-06 LAB — LIPID PANEL
Chol/HDL Ratio: 4.1 ratio (ref 0.0–5.0)
Cholesterol, Total: 172 mg/dL (ref 100–199)
HDL: 42 mg/dL (ref >39)
LDL Chol Calc (NIH): 111 mg/dL — ABNORMAL HIGH (ref 0–99)
Triglycerides: 103 mg/dL (ref 0–149)
VLDL Cholesterol Cal: 19 mg/dL (ref 5–40)

## 2023-09-06 LAB — TESTOSTERONE,FREE AND TOTAL
Testosterone, Free: 3.2 pg/mL — ABNORMAL LOW (ref 7.2–24.0)
Testosterone: 128 ng/dL — ABNORMAL LOW (ref 264–916)

## 2023-09-09 ENCOUNTER — Other Ambulatory Visit: Payer: Self-pay

## 2023-09-10 ENCOUNTER — Other Ambulatory Visit: Payer: Self-pay

## 2023-09-12 ENCOUNTER — Other Ambulatory Visit: Payer: Self-pay

## 2023-09-25 ENCOUNTER — Other Ambulatory Visit: Payer: Self-pay | Admitting: Nurse Practitioner

## 2023-09-25 ENCOUNTER — Other Ambulatory Visit: Payer: Self-pay

## 2023-09-25 DIAGNOSIS — R7303 Prediabetes: Secondary | ICD-10-CM

## 2023-09-25 DIAGNOSIS — I10 Essential (primary) hypertension: Secondary | ICD-10-CM

## 2023-09-25 DIAGNOSIS — J4520 Mild intermittent asthma, uncomplicated: Secondary | ICD-10-CM

## 2023-09-25 MED ORDER — FLUTICASONE-SALMETEROL 250-50 MCG/ACT IN AEPB
1.0000 | INHALATION_SPRAY | Freq: Two times a day (BID) | RESPIRATORY_TRACT | 3 refills | Status: AC
  Filled 2023-09-25 – 2023-10-17 (×2): qty 60, 30d supply, fill #0
  Filled 2023-12-24: qty 60, 30d supply, fill #1
  Filled 2024-01-26: qty 60, 30d supply, fill #2

## 2023-09-25 MED ORDER — ALBUTEROL SULFATE HFA 108 (90 BASE) MCG/ACT IN AERS
2.0000 | INHALATION_SPRAY | Freq: Four times a day (QID) | RESPIRATORY_TRACT | 2 refills | Status: DC | PRN
  Filled 2023-09-25 – 2023-09-26 (×2): qty 6.7, 25d supply, fill #0
  Filled 2023-10-17: qty 6.7, 25d supply, fill #1
  Filled 2023-11-05 – 2023-11-06 (×3): qty 6.7, 25d supply, fill #2

## 2023-09-25 MED ORDER — IPRATROPIUM-ALBUTEROL 0.5-2.5 (3) MG/3ML IN SOLN
3.0000 mL | Freq: Four times a day (QID) | RESPIRATORY_TRACT | 1 refills | Status: AC | PRN
  Filled 2023-09-25: qty 90, 8d supply, fill #0
  Filled 2023-11-05: qty 360, 30d supply, fill #0
  Filled 2024-02-13: qty 360, 30d supply, fill #1

## 2023-09-25 NOTE — Telephone Encounter (Unsigned)
 Copied from CRM #[id number]. Topic: Clinical - Medication Refill >> Sep 25, 2023  1:03 PM DeAngela L wrote: Medication: albuterol  (PROVENTIL  HFA) 108 (90 Base) MCG/ACT inhaler  Patient needs this inhaler as soon as possible, asking if this refill could be completed today    Has the patient contacted their pharmacy? Yes  (Agent: If no, request that the patient contact the pharmacy for the refill. If patient does not wish to contact the pharmacy document the reason why and proceed with request.) (Agent: If yes, when and what did the pharmacy advise?)  This is the patient's preferred pharmacy:  [facility] [address] [place] [address] Phone: [phone] Fax: [phone]  Is this the correct pharmacy for this prescription? Yes  If no, delete pharmacy and type the correct one.   Has the prescription been filled recently? Yes   Is the patient out of the medication? Yes   Has the patient been seen for an appointment in the last year OR does the patient have an upcoming appointment? Yes   Can we respond through MyChart? Yes   Agent: Please be advised that Rx refills may take up to 3 business days. We ask that you follow-up with your pharmacy.

## 2023-09-26 ENCOUNTER — Other Ambulatory Visit: Payer: Self-pay

## 2023-09-30 ENCOUNTER — Other Ambulatory Visit: Payer: Self-pay | Admitting: Nurse Practitioner

## 2023-09-30 DIAGNOSIS — R7989 Other specified abnormal findings of blood chemistry: Secondary | ICD-10-CM

## 2023-09-30 DIAGNOSIS — N529 Male erectile dysfunction, unspecified: Secondary | ICD-10-CM

## 2023-10-03 ENCOUNTER — Other Ambulatory Visit: Payer: Self-pay

## 2023-10-07 ENCOUNTER — Other Ambulatory Visit: Payer: Self-pay

## 2023-10-07 ENCOUNTER — Encounter (HOSPITAL_BASED_OUTPATIENT_CLINIC_OR_DEPARTMENT_OTHER): Payer: Self-pay | Attending: Internal Medicine | Admitting: Internal Medicine

## 2023-10-17 ENCOUNTER — Other Ambulatory Visit: Payer: Self-pay

## 2023-10-17 ENCOUNTER — Other Ambulatory Visit: Payer: Self-pay | Admitting: Nurse Practitioner

## 2023-10-24 ENCOUNTER — Other Ambulatory Visit: Payer: Self-pay

## 2023-11-05 ENCOUNTER — Other Ambulatory Visit: Payer: Self-pay

## 2023-11-05 ENCOUNTER — Other Ambulatory Visit (HOSPITAL_COMMUNITY): Payer: Self-pay

## 2023-11-05 MED ORDER — PREDNISONE 20 MG PO TABS
40.0000 mg | ORAL_TABLET | Freq: Every day | ORAL | 0 refills | Status: DC
  Filled 2023-11-05: qty 10, 5d supply, fill #0

## 2023-11-05 MED ORDER — DOXYCYCLINE MONOHYDRATE 100 MG PO CAPS
100.0000 mg | ORAL_CAPSULE | Freq: Two times a day (BID) | ORAL | 0 refills | Status: DC
  Filled 2023-11-05: qty 14, 7d supply, fill #0

## 2023-11-06 ENCOUNTER — Other Ambulatory Visit: Payer: Self-pay

## 2023-11-07 ENCOUNTER — Telehealth: Payer: Self-pay | Admitting: Nurse Practitioner

## 2023-11-07 NOTE — Telephone Encounter (Signed)
 Called pt to reschedule appt. We were able to do that at this time.

## 2023-12-02 ENCOUNTER — Other Ambulatory Visit: Payer: Self-pay

## 2023-12-02 ENCOUNTER — Other Ambulatory Visit: Payer: Self-pay | Admitting: Nurse Practitioner

## 2023-12-02 ENCOUNTER — Telehealth: Payer: Self-pay | Admitting: Nurse Practitioner

## 2023-12-02 DIAGNOSIS — J4520 Mild intermittent asthma, uncomplicated: Secondary | ICD-10-CM

## 2023-12-02 MED ORDER — ALBUTEROL SULFATE HFA 108 (90 BASE) MCG/ACT IN AERS
2.0000 | INHALATION_SPRAY | Freq: Four times a day (QID) | RESPIRATORY_TRACT | 2 refills | Status: DC | PRN
  Filled 2023-12-02: qty 6.7, 25d supply, fill #0
  Filled 2023-12-24: qty 6.7, 25d supply, fill #1
  Filled 2024-01-26: qty 6.7, 25d supply, fill #2

## 2023-12-02 NOTE — Telephone Encounter (Signed)
 Copied from CRM #[id number]. Topic: Clinical - Medication Question >> Dec 02, 2023  3:20 PM Dedra B wrote:  Reason for CRM: Pt called to follow up on refill request for albuterol  sent by pharmacy. Pt said he is out and would like to have it sent to [facility] on [address] today if possible.

## 2023-12-02 NOTE — Telephone Encounter (Signed)
 Patient identified by name and date of birth.  Aware of refill being sent to the pharmacy.

## 2023-12-04 ENCOUNTER — Other Ambulatory Visit: Payer: Self-pay

## 2023-12-05 ENCOUNTER — Other Ambulatory Visit (HOSPITAL_COMMUNITY): Payer: Self-pay

## 2023-12-05 ENCOUNTER — Ambulatory Visit: Payer: Self-pay | Admitting: Nurse Practitioner

## 2023-12-05 MED ORDER — TADALAFIL 5 MG PO TABS
5.0000 mg | ORAL_TABLET | Freq: Every day | ORAL | 3 refills | Status: AC
  Filled 2023-12-05: qty 30, 30d supply, fill #0
  Filled 2023-12-08: qty 90, 90d supply, fill #0
  Filled 2024-03-02: qty 90, 90d supply, fill #1

## 2023-12-08 ENCOUNTER — Other Ambulatory Visit: Payer: Self-pay

## 2023-12-15 ENCOUNTER — Encounter: Payer: Self-pay | Admitting: Nurse Practitioner

## 2023-12-15 ENCOUNTER — Other Ambulatory Visit: Payer: Self-pay

## 2023-12-15 ENCOUNTER — Ambulatory Visit: Payer: Self-pay | Attending: Nurse Practitioner | Admitting: Nurse Practitioner

## 2023-12-15 VITALS — BP 159/89 | HR 94 | Resp 19 | Ht 70.0 in | Wt 373.8 lb

## 2023-12-15 DIAGNOSIS — M25562 Pain in left knee: Secondary | ICD-10-CM

## 2023-12-15 DIAGNOSIS — Z713 Dietary counseling and surveillance: Secondary | ICD-10-CM

## 2023-12-15 DIAGNOSIS — Z23 Encounter for immunization: Secondary | ICD-10-CM | POA: Diagnosis not present

## 2023-12-15 DIAGNOSIS — I10 Essential (primary) hypertension: Secondary | ICD-10-CM

## 2023-12-15 DIAGNOSIS — R7303 Prediabetes: Secondary | ICD-10-CM

## 2023-12-15 DIAGNOSIS — M25561 Pain in right knee: Secondary | ICD-10-CM

## 2023-12-15 DIAGNOSIS — Z9289 Personal history of other medical treatment: Secondary | ICD-10-CM

## 2023-12-15 DIAGNOSIS — G4733 Obstructive sleep apnea (adult) (pediatric): Secondary | ICD-10-CM

## 2023-12-15 DIAGNOSIS — R000 Tachycardia, unspecified: Secondary | ICD-10-CM

## 2023-12-15 DIAGNOSIS — Z7182 Exercise counseling: Secondary | ICD-10-CM

## 2023-12-15 MED ORDER — MELOXICAM 15 MG PO TABS
15.0000 mg | ORAL_TABLET | Freq: Every day | ORAL | 1 refills | Status: AC
  Filled 2023-12-15: qty 30, 30d supply, fill #0
  Filled 2024-01-26 (×2): qty 30, 30d supply, fill #1

## 2023-12-15 MED ORDER — LOSARTAN POTASSIUM-HCTZ 100-25 MG PO TABS
1.0000 | ORAL_TABLET | Freq: Every day | ORAL | 1 refills | Status: AC
  Filled 2023-12-15 – 2024-03-02 (×2): qty 90, 90d supply, fill #0

## 2023-12-15 MED ORDER — METOPROLOL SUCCINATE ER 50 MG PO TB24
50.0000 mg | ORAL_TABLET | Freq: Every day | ORAL | 1 refills | Status: AC
  Filled 2023-12-15: qty 90, 90d supply, fill #0

## 2023-12-15 MED ORDER — ZEPBOUND 2.5 MG/0.5ML ~~LOC~~ SOAJ
2.5000 mg | SUBCUTANEOUS | 3 refills | Status: AC
  Filled 2023-12-15 – 2023-12-24 (×3): qty 2, 28d supply, fill #0

## 2023-12-15 NOTE — Progress Notes (Unsigned)
 Assessment & Plan:  Myran was seen today for hypertension and knee pain.  Diagnoses and all orders for this visit:  Primary hypertension -     losartan -hydrochlorothiazide  (HYZAAR ) 100-25 MG tablet; Take 1 tablet by mouth daily. -     metoprolol  succinate (TOPROL -XL) 50 MG 24 hr tablet; Take 1 tablet (50 mg total) by mouth daily. -     tirzepatide (ZEPBOUND) 2.5 MG/0.5ML Pen; Inject 2.5 mg into the skin once a week.  Prediabetes -     losartan -hydrochlorothiazide  (HYZAAR ) 100-25 MG tablet; Take 1 tablet by mouth daily. -     metoprolol  succinate (TOPROL -XL) 50 MG 24 hr tablet; Take 1 tablet (50 mg total) by mouth daily. -     tirzepatide (ZEPBOUND) 2.5 MG/0.5ML Pen; Inject 2.5 mg into the skin once a week.  Tachycardia -     metoprolol  succinate (TOPROL -XL) 50 MG 24 hr tablet; Take 1 tablet (50 mg total) by mouth daily.  Need for influenza vaccination -     Flu vaccine trivalent PF, 6mos and older(Flulaval,Afluria,Fluarix,Fluzone)  Acute pain of both knees -     Ambulatory referral to Orthopedic Surgery -     meloxicam (MOBIC) 15 MG tablet; Take 1 tablet (15 mg total) by mouth daily. For knee pain Bilateral knee pain post-fall, left knee more severe. Possible ligament or tendon stress. - Referred to orthopedist for evaluation. - Prescribed meloxicam 15 mg daily with food. - Advised to file workman's compensation report. - Instructed to report back in one week on meloxicam effectivenes   Obstructive sleep apnea syndrome -     tirzepatide (ZEPBOUND) 2.5 MG/0.5ML Pen; Inject 2.5 mg into the skin once a week. Diagnosed obstructive sleep apnea. Previous CPAP trial unsuccessful. Insurance may cover Zepbound. - Ordered home sleep study. - Prescribed Zepbound to assess insurance coverage.   History of sleep study -     Home sleep test; Future    Patient has been counseled on age-appropriate routine health concerns for screening and prevention. These are reviewed and  up-to-date. Referrals have been placed accordingly. Immunizations are up-to-date or declined.    Subjective:   Chief Complaint  Patient presents with   Hypertension   Knee Pain    Clemens a month ago and landed on knees.     ADRIN JULIAN 53 y.o. male presents to office today with bilateral knee pain following a work-related injury.  PMH: asthma, HTN, prediabetes, morbid obesity  He has been experiencing bilateral knee pain R>L following a fall at work approximately one month ago. The incident occurred when he was stepping off a ladder which caused him to fall forward onto both knees. Initially, he did not seek medical treatment or file a workman's compensation claim, as he believed the pain would improve. Unfortunately, the pain persisted. He describes the pain as severe, rating it an 8 out of 10 when standing, which decreases to a 4 out of 10 after moving around. Swelling is noted in the left knee, and he has been unable to work for the past month due to the pain.  He has a history of low testosterone  for which he is awaiting follow-up for potential testosterone  injections. He is currently taking Cialis but reports limited sexual activity due to knee pain.  He also discusses a past diagnosis of sleep apnea, for which he has not been using a CPAP machine. He mentions a previous unsuccessful attempt at a sleep study approximately ten years ago.  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 No recent fever or other systemic symptoms.  Blood pressure is elevated today. He is currently prescribed hyzaar  100-25 mg dialy and toprol  XL 50 mg daily.  BP Readings from Last 3 Encounters:  12/15/23 (!) 159/89  09/02/23 134/74  02/03/23 (!) 152/82    Yacine is morbidly obese and we are requesting zepbound for weight management today.  The patient is new to therapy and is 53 years old with a BMI of 53.63  today in office. Zaylan has a co morbid history of  sleep apnea, hypertension, asthma and prediabetes.  Primary hypertension for which we are managing with an antihypertensive.  The patient is currently on and will continue lifestyle modification including structured nutrition and physical activity at the time GLP1 therapy commences and Zimere will NOT be using the requested agent in combination with another GLP-1  Burnis does NOT have any FDA-labeled contraindications to the requested agent, including history of medullary thyroid  cancer or multiple endocrine neoplasia type II Current weight loss activities he has implemented include: calorie counting with tracker, reduced caloric diet      Review of Systems  Constitutional:  Negative for fever, malaise/fatigue and weight loss.  HENT: Negative.  Negative for nosebleeds.   Eyes: Negative.  Negative for blurred vision, double vision and photophobia.  Respiratory: Negative.  Negative for cough and shortness of breath.   Cardiovascular: Negative.  Negative for chest pain, palpitations and leg swelling.  Gastrointestinal: Negative.  Negative for heartburn, nausea and vomiting.  Genitourinary:        Low libido  Musculoskeletal:  Positive for joint pain. Negative for myalgias.  Neurological: Negative.  Negative for dizziness, focal weakness, seizures and headaches.  Psychiatric/Behavioral: Negative.  Negative for suicidal ideas.     Past Medical History:  Diagnosis Date   Asthma    Hypertension    Prediabetes    Ulcer    venous insufficiency    No past surgical history on file.  Family History  Problem Relation Age of Onset   Asthma Mother    Hypertension Mother     Social History Reviewed with no changes to be made today.   Outpatient Medications Prior to Visit  Medication Sig Dispense Refill   albuterol  (PROVENTIL  HFA) 108 (90 Base) MCG/ACT inhaler Inhale 2 puffs into the lungs every 6 (six) hours as needed for wheezing or shortness of breath. 6.7 g 2   fluticasone -salmeterol  (ADVAIR ) 250-50 MCG/ACT AEPB Inhale 1 puff into the lungs in the morning and at bedtime. 60 each 3   ipratropium-albuterol  (DUONEB) 0.5-2.5 (3) MG/3ML SOLN Take 3 mLs by nebulization every 6 (six) hours as needed. 360 mL 1   tadalafil (CIALIS) 5 MG tablet Take 1 tablet (5 mg total) by mouth daily. 90 tablet 3   traZODone  (DESYREL ) 50 MG tablet Take 0.5-1 tablets (25-50 mg total) by mouth at bedtime as needed for sleep. 30 tablet 3   Vitamin D , Ergocalciferol , (DRISDOL ) 1.25 MG (50000 UNIT) CAPS capsule Take 1 capsule (50,000 Units total) by mouth once a week. 12 capsule 1   doxycycline  (MONODOX ) 100 MG capsule Take 1 capsule (100 mg total) by mouth 2 (two) times daily. (Patient not taking: Reported on 12/15/2023) 14 capsule 0   losartan -hydrochlorothiazide  (HYZAAR ) 100-25 MG tablet Take 1 tablet by mouth daily. 90 tablet 1   metoprolol  succinate (TOPROL -XL) 50 MG 24 hr tablet Take 1 tablet (50 mg total) by mouth daily. 90 tablet 1   predniSONE  (  DELTASONE ) 20 MG tablet take 2 tablets by mouth daily (Patient not taking: Reported on 12/15/2023) 10 tablet 0   sildenafil  (VIAGRA ) 100 MG tablet Take 1 tablet (100 mg total) by mouth as needed for erectile dysfunction. (Patient not taking: Reported on 12/15/2023) 10 tablet 3   No facility-administered medications prior to visit.    Allergies  Allergen Reactions   Shrimp [Shellfish Allergy]     Lip swell up.        Objective:    BP (!) 159/89 (BP Location: Left Arm, Patient Position: Sitting, Cuff Size: Large)   Pulse 94   Resp 19   Ht 5' 10 (1.778 m)   Wt (!) 373 lb 12.8 oz (169.6 kg)   SpO2 100%   BMI 53.63 kg/m  Wt Readings from Last 3 Encounters:  12/15/23 (!) 373 lb 12.8 oz (169.6 kg)  09/02/23 (!) 381 lb 9.6 oz (173.1 kg)  02/03/23 (!) 394 lb (178.7 kg)    Physical Exam Vitals and nursing note reviewed.  Constitutional:      Appearance: He is well-developed. He is obese.  HENT:     Head: Normocephalic and atraumatic.   Cardiovascular:     Rate and Rhythm: Normal rate and regular rhythm.     Heart sounds: Normal heart sounds. No murmur heard.    No friction rub. No gallop.  Pulmonary:     Effort: Pulmonary effort is normal. No tachypnea or respiratory distress.     Breath sounds: Normal breath sounds. No decreased breath sounds, wheezing, rhonchi or rales.  Chest:     Chest wall: No tenderness.  Musculoskeletal:        General: Normal range of motion.     Cervical back: Normal range of motion.  Skin:    General: Skin is warm and dry.  Neurological:     Mental Status: He is alert and oriented to person, place, and time.     Coordination: Coordination normal.  Psychiatric:        Behavior: Behavior normal. Behavior is cooperative.        Thought Content: Thought content normal.        Judgment: Judgment normal.          Patient has been counseled extensively about nutrition and exercise as well as the importance of adherence with medications and regular follow-up. The patient was given clear instructions to go to ER or return to medical center if symptoms don't improve, worsen or new problems develop. The patient verbalized understanding.   Follow-up: Return in about 3 months (around 03/16/2024).   Haze LELON Servant, FNP-BC [facility] [place], [place] [phone]   12/18/2023, 12:17 AM

## 2023-12-15 NOTE — Patient Instructions (Addendum)
 [facility]  [address], [address] [place], [place] [address] Ph# [phone]  Fax  [phone]   [facility] [phone]

## 2023-12-18 ENCOUNTER — Encounter: Payer: Self-pay | Admitting: Nurse Practitioner

## 2023-12-23 ENCOUNTER — Other Ambulatory Visit: Payer: Self-pay

## 2023-12-24 ENCOUNTER — Other Ambulatory Visit: Payer: Self-pay

## 2023-12-30 ENCOUNTER — Ambulatory Visit: Admitting: Orthopaedic Surgery

## 2024-01-26 ENCOUNTER — Other Ambulatory Visit: Payer: Self-pay

## 2024-01-26 ENCOUNTER — Other Ambulatory Visit: Payer: Self-pay | Admitting: Nurse Practitioner

## 2024-01-26 DIAGNOSIS — E559 Vitamin D deficiency, unspecified: Secondary | ICD-10-CM

## 2024-02-05 ENCOUNTER — Other Ambulatory Visit: Payer: Self-pay

## 2024-02-09 ENCOUNTER — Other Ambulatory Visit: Payer: Self-pay

## 2024-02-09 ENCOUNTER — Other Ambulatory Visit: Payer: Self-pay | Admitting: Nurse Practitioner

## 2024-02-09 DIAGNOSIS — J4520 Mild intermittent asthma, uncomplicated: Secondary | ICD-10-CM

## 2024-02-09 MED ORDER — ALBUTEROL SULFATE HFA 108 (90 BASE) MCG/ACT IN AERS
2.0000 | INHALATION_SPRAY | Freq: Four times a day (QID) | RESPIRATORY_TRACT | 2 refills | Status: AC | PRN
  Filled 2024-02-09 – 2024-02-10 (×2): qty 6.7, 25d supply, fill #0
  Filled 2024-03-02: qty 6.7, 25d supply, fill #1

## 2024-02-10 ENCOUNTER — Other Ambulatory Visit: Payer: Self-pay

## 2024-02-13 ENCOUNTER — Other Ambulatory Visit: Payer: Self-pay

## 2024-02-17 ENCOUNTER — Other Ambulatory Visit: Payer: Self-pay

## 2024-02-25 ENCOUNTER — Other Ambulatory Visit: Payer: Self-pay

## 2024-03-02 ENCOUNTER — Other Ambulatory Visit: Payer: Self-pay

## 2024-03-16 ENCOUNTER — Ambulatory Visit: Admitting: Nurse Practitioner
